# Patient Record
Sex: Male | Born: 1960
Health system: Southern US, Community
[De-identification: ages and names within clinical notes are randomized; demographics above are authoritative.]

## PROBLEM LIST (undated history)

## (undated) DIAGNOSIS — M545 Low back pain, unspecified: Secondary | ICD-10-CM

## (undated) DIAGNOSIS — K219 Gastro-esophageal reflux disease without esophagitis: Secondary | ICD-10-CM

## (undated) DIAGNOSIS — H903 Sensorineural hearing loss, bilateral: Secondary | ICD-10-CM

## (undated) DIAGNOSIS — G8929 Other chronic pain: Secondary | ICD-10-CM

## (undated) DIAGNOSIS — Z87898 Personal history of other specified conditions: Secondary | ICD-10-CM

## (undated) DIAGNOSIS — R51 Headache: Secondary | ICD-10-CM

## (undated) DIAGNOSIS — S46212A Strain of muscle, fascia and tendon of other parts of biceps, left arm, initial encounter: Secondary | ICD-10-CM

## (undated) DIAGNOSIS — R519 Headache, unspecified: Secondary | ICD-10-CM

## (undated) DIAGNOSIS — E785 Hyperlipidemia, unspecified: Secondary | ICD-10-CM

## (undated) DIAGNOSIS — J449 Chronic obstructive pulmonary disease, unspecified: Secondary | ICD-10-CM

## (undated) DIAGNOSIS — J329 Chronic sinusitis, unspecified: Secondary | ICD-10-CM

## (undated) HISTORY — DX: Low back pain: M54.5

## (undated) HISTORY — DX: Headache: R51

## (undated) HISTORY — DX: Low back pain, unspecified: M54.50

## (undated) HISTORY — DX: Other chronic pain: G89.29

## (undated) HISTORY — DX: Hyperlipidemia, unspecified: E78.5

## (undated) HISTORY — DX: Chronic obstructive pulmonary disease, unspecified: J44.9

## (undated) HISTORY — DX: Headache, unspecified: R51.9

## (undated) HISTORY — DX: Sensorineural hearing loss, bilateral: H90.3

## (undated) HISTORY — DX: Chronic sinusitis, unspecified: J32.9

## (undated) HISTORY — DX: Personal history of other specified conditions: Z87.898

## (undated) HISTORY — PX: CARPAL TUNNEL RELEASE: SHX101

## (undated) HISTORY — DX: Strain of muscle, fascia and tendon of other parts of biceps, left arm, initial encounter: S46.212A

## (undated) HISTORY — DX: Gastro-esophageal reflux disease without esophagitis: K21.9

---

## 2006-08-29 ENCOUNTER — Emergency Department (HOSPITAL_COMMUNITY): Admission: EM | Admit: 2006-08-29 | Discharge: 2006-08-29 | Payer: Self-pay | Admitting: Emergency Medicine

## 2006-08-30 ENCOUNTER — Ambulatory Visit: Payer: Self-pay | Admitting: Internal Medicine

## 2006-08-31 ENCOUNTER — Ambulatory Visit: Payer: Self-pay | Admitting: Cardiovascular Disease

## 2006-10-26 ENCOUNTER — Ambulatory Visit: Payer: Self-pay | Admitting: Cardiovascular Disease

## 2007-10-19 ENCOUNTER — Encounter: Payer: Self-pay | Admitting: *Deleted

## 2007-10-19 DIAGNOSIS — R0789 Other chest pain: Secondary | ICD-10-CM | POA: Insufficient documentation

## 2007-10-19 DIAGNOSIS — Z8679 Personal history of other diseases of the circulatory system: Secondary | ICD-10-CM

## 2008-12-11 DIAGNOSIS — Z87898 Personal history of other specified conditions: Secondary | ICD-10-CM

## 2008-12-11 HISTORY — PX: OTHER SURGICAL HISTORY: SHX169

## 2008-12-11 HISTORY — DX: Personal history of other specified conditions: Z87.898

## 2009-12-11 HISTORY — PX: OTHER SURGICAL HISTORY: SHX169

## 2010-08-04 ENCOUNTER — Encounter (INDEPENDENT_AMBULATORY_CARE_PROVIDER_SITE_OTHER): Payer: Self-pay | Admitting: *Deleted

## 2010-08-04 ENCOUNTER — Ambulatory Visit: Payer: Self-pay | Admitting: Internal Medicine

## 2010-08-04 ENCOUNTER — Encounter: Payer: Self-pay | Admitting: Internal Medicine

## 2010-08-04 DIAGNOSIS — Z87891 Personal history of nicotine dependence: Secondary | ICD-10-CM | POA: Insufficient documentation

## 2010-08-04 DIAGNOSIS — E663 Overweight: Secondary | ICD-10-CM | POA: Insufficient documentation

## 2010-08-04 DIAGNOSIS — K219 Gastro-esophageal reflux disease without esophagitis: Secondary | ICD-10-CM | POA: Insufficient documentation

## 2010-08-04 LAB — CONVERTED CEMR LAB
ALT: 28 units/L (ref 0–53)
AST: 24 units/L (ref 0–37)
Albumin: 4.1 g/dL (ref 3.5–5.2)
Alkaline Phosphatase: 76 units/L (ref 39–117)
BUN: 20 mg/dL (ref 6–23)
Bilirubin, Direct: 0.1 mg/dL (ref 0.0–0.3)
CO2: 27 meq/L (ref 19–32)
Calcium: 9.6 mg/dL (ref 8.4–10.5)
Chloride: 108 meq/L (ref 96–112)
Cholesterol: 168 mg/dL (ref 0–200)
Creatinine, Ser: 0.9 mg/dL (ref 0.4–1.5)
GFR calc non Af Amer: 94.87 mL/min (ref >60)
Glucose, Bld: 96 mg/dL (ref 70–99)
HDL: 36.7 mg/dL — ABNORMAL LOW (ref >39.00)
LDL Cholesterol: 114 mg/dL — ABNORMAL HIGH (ref 0–99)
PSA: 1.02 ng/mL (ref 0.10–4.00)
Potassium: 4.5 meq/L (ref 3.5–5.1)
Sodium: 139 meq/L (ref 135–145)
TSH: 1.87 microintl units/mL (ref 0.35–5.50)
Total Bilirubin: 0.5 mg/dL (ref 0.3–1.2)
Total CHOL/HDL Ratio: 5
Total Protein: 6.5 g/dL (ref 6.0–8.3)
Triglycerides: 89 mg/dL (ref 0.0–149.0)
VLDL: 17.8 mg/dL (ref 0.0–40.0)

## 2010-08-08 ENCOUNTER — Telehealth: Payer: Self-pay | Admitting: Family Medicine

## 2010-08-09 ENCOUNTER — Telehealth: Payer: Self-pay | Admitting: Family Medicine

## 2010-08-25 ENCOUNTER — Ambulatory Visit: Payer: Self-pay | Admitting: Internal Medicine

## 2010-08-25 DIAGNOSIS — R0602 Shortness of breath: Secondary | ICD-10-CM | POA: Insufficient documentation

## 2010-08-25 DIAGNOSIS — M545 Low back pain, unspecified: Secondary | ICD-10-CM | POA: Insufficient documentation

## 2010-08-25 DIAGNOSIS — J984 Other disorders of lung: Secondary | ICD-10-CM | POA: Insufficient documentation

## 2010-08-25 LAB — CONVERTED CEMR LAB
Cholesterol, target level: 200 mg/dL
HDL goal, serum: 40 mg/dL
LDL Goal: 100 mg/dL

## 2010-08-26 ENCOUNTER — Encounter: Payer: Self-pay | Admitting: Family Medicine

## 2010-08-29 ENCOUNTER — Telehealth (INDEPENDENT_AMBULATORY_CARE_PROVIDER_SITE_OTHER): Payer: Self-pay | Admitting: *Deleted

## 2010-09-10 HISTORY — PX: COLONOSCOPY: SHX174

## 2010-09-20 ENCOUNTER — Encounter (INDEPENDENT_AMBULATORY_CARE_PROVIDER_SITE_OTHER): Payer: Self-pay | Admitting: *Deleted

## 2010-09-22 ENCOUNTER — Ambulatory Visit: Payer: Self-pay | Admitting: Internal Medicine

## 2010-09-27 ENCOUNTER — Telehealth (INDEPENDENT_AMBULATORY_CARE_PROVIDER_SITE_OTHER): Payer: Self-pay | Admitting: *Deleted

## 2010-09-28 ENCOUNTER — Encounter (INDEPENDENT_AMBULATORY_CARE_PROVIDER_SITE_OTHER): Payer: Self-pay | Admitting: *Deleted

## 2010-10-06 ENCOUNTER — Ambulatory Visit: Payer: Self-pay | Admitting: Internal Medicine

## 2010-10-06 LAB — HM COLONOSCOPY: HM Colonoscopy: 2

## 2010-10-12 ENCOUNTER — Encounter: Payer: Self-pay | Admitting: Internal Medicine

## 2011-01-10 NOTE — Letter (Signed)
Summary: Patient Notice-Hyperplastic Polyps  [facility]  [address] [place], [place] [address]   Phone: [phone]  Fax: [phone]        October 12, 2010 MRN: [id number]    Derrick Horn [address] [place], [place]  [address]    Dear Derrick Horn,  I am pleased to inform you that the colon polyps removed during your recent colonoscopy were NOT pre-cancerous.  It is therefore my recommendation that you have a repeat colonoscopy examination in 10 years for routine colorectal cancer screening.  Should you develop new or worsening symptoms of abdominal pain, bowel habit changes or bleeding from the rectum or bowels, please schedule an evaluation with either your primary care physician or with me.  Please call us if you are having persistent problems or have questions about your condition that have not been fully answered at this time.   Sincerely,  Iva Boop MD, [facility] This letter has been electronically signed by your physician.  Appended Document: Patient Notice-Hyperplastic Polyps letter mailed

## 2011-01-10 NOTE — Progress Notes (Signed)
Summary: Chest X-ray  Phone Note Other Incoming Call back at [phone]   Caller: Danella Penton @ [facility] Summary of Call: [facility] asking for pt's chest x-ray for their review. Initial call taken by: Mervin Hack CMA Duncan Dull),  August 29, 2010 12:10 PM  Follow-up for Phone Call        Faxed results as requested Follow-up by: Janee Morn CMA Duncan Dull),  August 29, 2010 1:08 PM

## 2011-01-10 NOTE — Procedures (Signed)
Summary: Colonoscopy  Patient: Derrick Horn Note: All result statuses are Final unless otherwise noted.  Tests: (1) Colonoscopy (COL)   COL Colonoscopy           DONE     [facility]     [address]     [place], [place]  [address]           COLONOSCOPY PROCEDURE REPORT           PATIENT:  Derrick Horn, Derrick Horn  MR#:  [id number]     BIRTHDATE:  [birth year], 49 yrs. old  GENDER:  male     ENDOSCOPIST:  Iva Boop, MD, [facility]     REF. BY:  Eustaquio Boyden, M.D.     PROCEDURE DATE:  10/06/2010     PROCEDURE:  Colonoscopy with snare polypectomy     ASA CLASS:  Class I     INDICATIONS:  Routine Risk Screening     MEDICATIONS:   Fentanyl 75 mcg IV, Versed 7 mg IV           DESCRIPTION OF PROCEDURE:   After the risks benefits and     alternatives of the procedure were thoroughly explained, informed     consent was obtained.  Digital rectal exam was performed and     revealed no abnormalities and normal prostate.   The LB CF-[id number]     [id number] endoscope was introduced through the anus and advanced to     the cecum, which was identified by both the appendix and ileocecal     valve, without limitations.  The quality of the prep was good,     using Colyte.  The instrument was then slowly withdrawn as the     colon was fully examined. Insertion: 4:40 minutes Withdrawal;     12:46 minutes     <<PROCEDUREIMAGES>>           FINDINGS:  Two polyps were found. They were diminutive. They were     3-4 mm in size. Splenic flexure and sigmoid. Polyps were snared     without cautery. Retrieval was successful.   This was otherwise a     normal examination of the colon.   Retroflexed views in the rectum     revealed no abnormalities.    The scope was then withdrawn from     the patient and the procedure completed.           COMPLICATIONS:  None     ENDOSCOPIC IMPRESSION:     1) Two diminutive  polyps removed     2) Otherwise normal examination, good prep           REPEAT EXAM:  In for  Colonoscopy, pending biopsy results.           Iva Boop, MD, [facility]           CC:  Eustaquio Boyden MD     The Patient           n.     Rosalie Doctor:   Iva Boop at 10/06/2010 09:08 AM           Derrick Horn, [id number]  Note: An exclamation mark (!) indicates a result that was not dispersed into the flowsheet. Document Creation Date: 10/06/2010 9:09 AM _______________________________________________________________________  (1) Order result status: Final Collection or observation date-time: 10/06/2010 08:55 Requested date-time:  Receipt date-time:  Reported date-time:  Referring Physician:   Ordering Physician: Stan Head [phone]) Specimen Source:  Source: Launa Grill Order Number: [id number] Lab site:   Appended Document: Colonoscopy   Colonoscopy  Procedure date:  10/06/2010  Findings:      1) Two diminutive  polyps removed - hyperpastic and inflammatory     2) Otherwise normal examination, good prep  Comments:      Repeat colonoscopy in 10 years.    Procedures Next Due Date:    Colonoscopy: 10/2020   Appended Document: Colonoscopy     Procedures Next Due Date:    Colonoscopy: 10/2020

## 2011-01-10 NOTE — Miscellaneous (Signed)
Summary: [facility] Previsit/prep  Clinical Lists Changes  Medications: Added new medication of MOVIPREP 100 GM  SOLR (PEG-KCL-NACL-NASULF-NA ASC-C) As per prep instructions. - Signed Rx of MOVIPREP 100 GM  SOLR (PEG-KCL-NACL-NASULF-NA ASC-C) As per prep instructions.;  #1 x 0;  Signed;  Entered by: Wyona Almas RN;  Authorized by: Iva Boop MD, [facility];  Method used: [facility] [phone]*, [address], [place], [place], [place]  [address], Ph: [phone] or [phone], Fax: [phone] Allergies: Changed allergy or adverse reaction from * AZITHROMYCIN to * AZITHROMYCIN    Prescriptions: MOVIPREP 100 GM  SOLR (PEG-KCL-NACL-NASULF-NA ASC-C) As per prep instructions.  #1 x 0   Entered by:   Wyona Almas RN   Authorized by:   Iva Boop MD, [facility]   Signed by:   Wyona Almas RN on 09/22/2010   Method used:   [facility] to        [facility] [phone]* (retail)       [address] [place], [place]  [address]       Ph: [phone] or [phone]       Fax: [phone]   RxID:   [id number]

## 2011-01-10 NOTE — Assessment & Plan Note (Signed)
Summary: 2-3 weeks follow up smoking adn breathing/rbh   Vital Signs:  Patient profile:   50 year old male Weight:      186.75 pounds Temp:     97.8 degrees F oral Pulse rate:   64 / minute Pulse rhythm:   regular BP sitting:   110 / 80  (left arm) Cuff size:   regular  Vitals Entered By: Selena Batten Dance CMA Duncan Dull) (August 25, 2010 8:11 AM) CC: Follow up, Lipid Management   History of Present Illness: CC: f/u smoking, breathing  Smoking - QUIT x 2 wks.  hasn't used chantix, too expensive.  Became pissed off at [employer] and has decided to quit without using prescription medicine.  Using 1/2 patch daily (has self titrated from 1 whole patch).  irritated throat getting better.  Still having trouble with breathing:  Breathing - feels SOB esp when laying down at night.  Wakes up a couple times during night, feels phlegm and tightness in chest.  ETT 2010 ([facility]) WNL per patient.  no cough.  Celebrex pill for back - doing well, taking every other day, bothers stomache.  Reviewed labwork from last visit.  LDL 115, would like to see lower given early fmhx of CAD.  Requests flu shot today.  Lipid Management History:      Positive NCEP/ATP III risk factors include male age 36 years old or older, HDL cholesterol less than 40, and family history for ischemic heart disease (males less than 79 years old).  Negative NCEP/ATP III risk factors include non-diabetic, non-tobacco-user status, non-hypertensive, no prior stroke/TIA, and no peripheral vascular disease.    Preventive Screening-Counseling & Management  Alcohol-Tobacco     Smoking Status: quit  Current Medications (verified): 1)  Famotidine 20 Mg Tabs (Famotidine) .... As Needed Heartburn 2)  Celebrex 200 Mg Caps (Celecoxib) .... One By Mouth Every Other Day As Needed Back Pain  Allergies: 1)  ! * Azithromycin  Past History:  Past Medical History: Last updated: 08/23/2010 TOBACCO ABUSE, HX OF (ICD-V15.82) CHEST  PAIN, HX OF (ICD-V12.50), stress test WNL mild GERD chronic lower back pain recurrent sinus infections  Past Surgical History: Last updated: 08/04/2010 R CARPAL TUNNEL RELEASE, HX OF (ICD-V45.89)    ETT WNL 2010 (Dr. Eden Emms)  Family History: Last updated: 08/04/2010 GFA - CAD/MI at mid 30s, CVA Father - CAD s/p stents GMA - uterine? 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 Lungs are clear to auscultation, no crackles or wheezes. slightly coarse Heart:  Normal rate and regular rhythm. S1 and S2 normal without gallop, murmur, click, rub or other extra sounds. Extremities:  No clubbing, cyanosis, edema, or deformity noted with normal full range of motion of all joints.     Impression & Recommendations:  Problem # 1:  DYSPNEA (ICD-786.05) in longtime smoker.  check CXR baseline.  likely will note improvement as goes more time without smoking.  ? component of COPD.  if not improved, consider starting combivent vs advair. Orders: T-2 View CXR (71020TC)  Problem # 2:  SMOKER (ICD-305.1) doing remarkably well only using patch.  rec continued abstinence.  pt has Quitline number and website if needs. The following medications were removed from the medication list:    Buproban 150 Mg Xr12h-tab (Bupropion hcl (smoking deter)) ..... One pill daily for 3 days then increase to one pill two times a day  Orders: T-2 View CXR (71020TC)  Problem # 3:  HYPERLIPIDEMIA LDL 112, HDL 36 (ICD-272.4) discussed elevated LDL, low HDL.  Pt states eating more healthy now, considering  starting P90x.  I would like goal to be <100 given early fmhx CAD.  Labs Reviewed: SGOT: 24 (08/04/2010)   SGPT: 28 (08/04/2010)   HDL:36.70 (08/04/2010)  LDL:114 (08/04/2010)  Chol:168 (08/04/2010)  Trig:89.0 (08/04/2010)  Problem # 4:  LOW BACK PAIN, CHRONIC (ICD-724.2) per patient, h/o mild scoliosis and DDD.  on celebrex.  His updated medication list for this problem includes:    Celebrex 200 Mg Caps (Celecoxib) ..... One by mouth every other day as needed back pain  Complete Medication List: 1)  Famotidine 20 Mg Tabs (Famotidine) .... As needed heartburn 2)  Celebrex 200 Mg Caps (Celecoxib) .... One by mouth every other day as needed back pain  Other Orders: Flu Vaccine 42yrs + (04540) Admin 1st Vaccine (98119)  Lipid Assessment/Plan:      Based on NCEP/ATP III, the patient's risk factor category is "2 or more risk factors and a calculated 10 year CAD risk of < 20%".  The patient's lipid goals are as follows: Total cholesterol goal is 200; LDL cholesterol goal is 100; HDL cholesterol goal is 40; Triglyceride goal is 150.  His LDL cholesterol goal has not been met.  Secondary causes for hyperlipidemia have been ruled out.  He has been counseled on adjunctive measures for lowering his cholesterol and has been provided with dietary instructions.     Patient Instructions: 1)  Chest xray today.  Flu shot today. 2)  Please return in 2 months for follow up on smoking and cholesterol levels. 3)  Start taking a baby aspirin 4)  Great job with the smoking cessation!!  Keep up the good work. 5)  Pleasure to see you today.  Call clinic with questions.  Current Allergies (reviewed today): ! * AZITHROMYCIN   Immunizations Administered:  Influenza Vaccine # 1:    Vaccine Type: Fluvax 3+    Site: left deltoid    Mfr: GlaxoSmithKline    Dose: 0.5 ml    Route: IM    Given by: Janee Morn CMA (AAMA)    Exp. Date: 06/10/2011    Lot #: [id number]    VIS given: 07/05/10 version given  August 25, 2010.  Flu Vaccine Consent Questions:    Do you have a history of severe allergic reactions to this vaccine? no    Any prior history of allergic reactions to egg and/or gelatin? no    Do you have a sensitivity to the preservative Thimersol? no    Do you have a past history of Guillan-Barre Syndrome? no    Do you currently have an acute febrile illness? no    Have you ever had a severe reaction to latex? no    Vaccine information given and explained to patient? yes    Prevention & Chronic Care Immunizations   Influenza vaccine: Fluvax 3+  (08/25/2010)   Influenza vaccine due: 08/12/2011    Tetanus booster: 08/04/2010: Tdap   Tetanus booster due: 08/04/2020    Pneumococcal vaccine: Not documented  Colorectal Screening   Hemoccult: Not documented    Colonoscopy: Not documented  Other  Screening   PSA: 1.02  (08/04/2010)   Smoking status: quit  (08/25/2010)  Lipids   Total Cholesterol: 168  (08/04/2010)   LDL: 114  (08/04/2010)   LDL Direct: Not documented   HDL: 36.70  (08/04/2010)   Triglycerides: 89.0  (08/04/2010)    SGOT (AST): 24  (08/04/2010)   SGPT (ALT): 28  (08/04/2010)   Alkaline phosphatase: 76  (08/04/2010)   Total bilirubin: 0.5  (08/04/2010)  Self-Management Support :    Lipid self-management support: Not documented

## 2011-01-10 NOTE — Assessment & Plan Note (Signed)
Summary: NEW PT TO ESTABH/DLO   Vital Signs:  Patient profile:   50 year old male Height:      68.5 inches Weight:      185 pounds BMI:     27.82 Temp:     98.0 degrees F oral Pulse rate:   70 / minute Pulse rhythm:   regular BP sitting:   116 / 78  (left arm) Cuff size:   large  Vitals Entered By: Selena Batten Dance CMA Duncan Dull) (August 04, 2010 10:05 AM) CC: New patient to establish care   History of Present Illness: CC: new patient, issues  1. smoking - since age 69yo, 20/4 to 1/2 ppd.  Multiple quit attempts.  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  Wants help, ready to quit.  Has tried patches (1/2 patch) and cold [place] in past.  [place].  Interested in chantix.  2. SOB - noticed during day, wants lung checked.  wants to quit smoking.  No leg swelling.  uses 1 pillow at night, no PND.  Doesn't snore excessively.  No fevers/chills, weight changes, night sweats.    3. h/o lower back pain x 5 years - previously saw MD at [facility] in [facility]?  told OA and mild scoliosis.  increases with activity.  Doesn't like meds.  Inversion table helps.  only in lower back.  s/p PT x 6 months, didn't notice much improvement.  glucosamine/chondroitin initially helped, no longer.  asks about cymbalta.  4. GERD - takes generic pepcid every 1-2 days.  knows what to stay away from.    5. preventative - wants colonscopy.  tetanus due, declines flu today, due for prosate check  Preventive Screening-Counseling & Management  Alcohol-Tobacco     Alcohol drinks/day: <1     Smoking Status: current  Caffeine-Diet-Exercise     Caffeine use/day: 1 cup coffee      Blood Transfusions:  no.    Current Medications (verified): 1)  Famotidine 20 Mg Tabs (Famotidine) .... As Needed Heartburn  Allergies: 1)  ! * Azithromycin  Past History:  Past Medical History: TOBACCO ABUSE, HX OF (ICD-V15.82) CHEST PAIN, HX OF (ICD-V12.50), stress test WNL mild GERD chronic lower back pain  Past Surgical  History: R CARPAL TUNNEL RELEASE, HX OF (ICD-V45.89)    ETT WNL 2010 (Dr. Eden Emms)  Family History: GFA - CAD/MI at mid 30s, CVA Father - CAD s/p stents GMA - uterine? 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         SOB at baseline.  blood when wiping.  no depression/anxiety, mood issues.  + weak stream, increased nocturia.  Physical Exam  General:  Well-developed,well-nourished,in no acute distress; alert,appropriate and cooperative throughout examination Head:  Normocephalic and atraumatic without obvious abnormalities. No apparent alopecia or balding. Eyes:  No corneal or conjunctival inflammation noted. EOMI. Perrla.  Ears:  External ear exam shows no significant lesions or deformities.  Otoscopic examination reveals clear canals, tympanic membranes are intact bilaterally without bulging, retraction, inflammation or discharge. Hearing is grossly normal bilaterally. Nose:  External nasal examination shows no deformity or inflammation. Nasal mucosa are pink and moist without lesions or exudates. Mouth:  Oral mucosa and oropharynx without lesions or exudates.  Teeth in good repair. Neck:  No deformities, masses, or tenderness noted. Lungs:  Normal respiratory effort, chest expands symmetrically. Lungs are clear to auscultation, no crackles or wheezes. slightly coarse Heart:  Normal rate and regular rhythm. S1 and S2 normal without gallop, murmur, click, rub or other  extra sounds. Abdomen:  Bowel sounds positive,abdomen soft and non-tender without masses, organomegaly or hernias noted. Rectal:  No external abnormalities noted. Normal sphincter tone. No rectal masses or tenderness. Prostate:  Prostate gland firm and smooth, + enlargement, no nodularity, tenderness, mass, asymmetry or induration.  about 30-40gms Msk:  No deformity or scoliosis noted of thoracic or lumbar spine.   Pulses:  2+ rad pulses Extremities:  No clubbing, cyanosis, edema, or deformity noted with normal full range of motion of all joints.   Neurologic:  CN 2-12 grossly intact, station and gait intact Skin:  Intact without suspicious lesions or rashes   Impression & Recommendations:  Problem # 1:  HEALTH MAINTENANCE EXAM (ICD-V70.0)  Reviewed preventive care protocols, scheduled due services, and updated immunizations.  declines flu.  tdap today.  Problem # 2:  SPECIAL SCREENING FOR MALIGNANT NEOPLASMS COLON (ICD-V76.51) referral for colonoscopy.  Orders: Gastroenterology Referral (GI)  Problem # 3:  SPECIAL SCREENING MALIGNANT NEOPLASM OF PROSTATE (ICD-V76.44) DRE with enlarged prostate.  await PSA and f/u in 3 wks for sxs, consider starting flomax. Orders: TLB-PSA (Prostate Specific Antigen) (84153-PSA)  Problem # 4:  OVERWEIGHT (ICD-278.02) basic bloodwork. Orders: TLB-BMP (Basic Metabolic Panel-BMET) (80048-METABOL) TLB-Hepatic/Liver Function Pnl (80076-HEPATIC) TLB-Lipid Panel (80061-LIPID)  Ht: 68.5 (08/04/2010)   Wt: 185 (08/04/2010)   BMI: 27.82 (08/04/2010)  Problem # 5:  GERD (ICD-530.81) continue H2R blocker prn His updated medication list for this problem includes:    Famotidine 20 Mg Tabs (Famotidine) .Marland Kitchen... As needed heartburn  Problem # 6:  SMOKER (ICD-305.1) Encouraged smoking cessation and discussed different methods for smoking cessation.  Pt opts to try chantix.  sent in, return 2-3 wks for f/u and f/u breathing, consider CXR.  no signs of  malignancy currently.  His updated medication list for this problem includes:    Chantix Starting Month Pak 0.5 Mg X 11 & 1 Mg X 42 Tabs (Varenicline tartrate) ..... One daily as directed    Chantix Continuing Month Pak 1 Mg Tabs (Varenicline tartrate) ..... One daily, use as directed  Complete Medication List: 1)  Famotidine 20 Mg Tabs (Famotidine) .... As needed heartburn 2)  Chantix Starting Month Pak 0.5 Mg X 11 & 1 Mg X 42 Tabs (Varenicline tartrate) .... One daily as directed 3)  Chantix Continuing Month Pak 1 Mg Tabs (Varenicline tartrate) .... One daily, use as directed  Other Orders: Tdap => 27yrs IM (03474) Admin 1st Vaccine (25956)  Patient Instructions: 1)  Return in 2-3 weeks for follow up on smoking and breathing. 2)  Chantix. 3)  [email or web address] qu[place].   4)  Tetanus shot today. 5)  Colonoscopy referral.  pass by [facility] office today. 6)  blood work today.  PSA. 7)  Records from [facility]. Prescriptions: CHANTIX CONTINUING MONTH PAK 1 MG TABS (VARENICLINE TARTRATE) one daily, use as directed  #1 x 0   Entered and Authorized by:   Eustaquio Boyden  MD   Signed by:   Eustaquio Boyden  MD on 08/04/2010  Method used:   [facility] [phone]* (retail)       [address] [place], [place]  [address]       Ph: [phone] or [phone]       Fax: [phone]   RxID:   [id number] CHANTIX STARTING MONTH PAK 0.5 MG X 11 & 1 MG X 42 TABS (VARENICLINE TARTRATE) one daily as directed  #1 x 0   Entered and Authorized by:   Eustaquio Boyden  MD   Signed by:   Eustaquio Boyden  MD on 08/04/2010   Method used:   [facility]        [facility] [phone]* (retail)       [address] [place], [place]  [address]       Ph: [phone] or [phone]       Fax: [phone]   RxID:   [id number]   Prior Medications: Current Allergies (reviewed today): ! *  AZITHROMYCIN   Immunizations Administered:  Tetanus Vaccine:    Vaccine Type: Tdap    Site: left deltoid    Mfr: GlaxoSmithKline    Dose: 0.5 ml    Route: IM    Given by: Selena Batten Dance CMA (AAMA)    Exp. Date: 09/29/2012    Lot #: [id number]    VIS given: 10/29/07 version given August 04, 2010.  Appended Document: Records from [facility]    Clinical Lists Changes  Observations: Added new observation of PAST MED HX: TOBACCO ABUSE, HX OF (ICD-V15.82) CHEST PAIN, HX OF (ICD-V12.50), stress test WNL mild GERD chronic lower back pain recurrent sinus infections (08/23/2010 14:24)        Past History:  Past Medical History: TOBACCO ABUSE, HX OF (ICD-V15.82) CHEST PAIN, HX OF (ICD-V12.50), stress test WNL mild GERD chronic lower back pain recurrent sinus infections

## 2011-01-10 NOTE — Miscellaneous (Signed)
Summary: new prep  Clinical Lists Changes  Medications: Added new medication of DULCOLAX 5 MG  TBEC (BISACODYL) Day before procedure take 2 at 3pm and 2 at 8pm. - Signed Added new medication of METOCLOPRAMIDE HCL 10 MG  TABS (METOCLOPRAMIDE HCL) As per prep instructions. - Signed Added new medication of MIRALAX   POWD (POLYETHYLENE GLYCOL 3350) As per prep  instructions. - Signed Rx of DULCOLAX 5 MG  TBEC (BISACODYL) Day before procedure take 2 at 3pm and 2 at 8pm.;  #4 x 0;  Signed;  Entered by: Wyona Almas RN;  Authorized by: Iva Boop MD, [facility];  Method used: [facility] to [facility] [phone]*, [address], [place], [place], [place]  [address], Ph: [phone] or [phone], Fax: [phone] Rx of METOCLOPRAMIDE HCL 10 MG  TABS (METOCLOPRAMIDE HCL) As per prep instructions.;  #2 x 0;  Signed;  Entered by: Wyona Almas RN;  Authorized by: Iva Boop MD, [facility];  Method used: Electronically to [facility]. [phone]*, [address] [place], [place], [place]  [address], Ph: [phone] or [phone], Fax: [phone] Rx of MIRALAX   POWD (POLYETHYLENE GLYCOL 3350) As per prep  instructions.;  #255gm x 0;  Signed;  Entered by: Wyona Almas RN;  Authorized by: Iva Boop MD, [facility];  Method used: Electronically to [facility]. [phone]*, [address] [place], [place], [place]  [address], Ph: [phone] or [phone], Fax: [phone]    Prescriptions: MIRALAX   POWD (POLYETHYLENE GLYCOL 3350) As per prep  instructions.  #255gm x 0   Entered by:   Wyona Almas RN   Authorized by:   Iva Boop MD, [facility]   Signed by:   Wyona Almas RN on 09/28/2010   Method used:   Electronically to        [facility]. [phone]* (retail)       [address] [place], [place]  [address]       Ph: [phone] or [phone]       Fax: [phone]   RxID:   [id number] METOCLOPRAMIDE HCL 10 MG  TABS (METOCLOPRAMIDE HCL) As per prep  instructions.  #2 x 0   Entered by:   Wyona Almas RN   Authorized by:   Iva Boop MD, [facility]   Signed by:   Wyona Almas RN on 09/28/2010   Method used:   Electronically to        [facility]. [phone]* (retail)       [address] [place], [place]  [address]       Ph: [phone] or [phone]       Fax: [phone]   RxID:   [id number] DULCOLAX 5 MG  TBEC (BISACODYL) Day before procedure take 2 at 3pm and 2 at 8pm.  #4 x 0   Entered by:   Wyona Almas RN   Authorized by:   Iva Boop MD, [facility]   Signed by:   Wyona Almas RN on 09/28/2010   Method used:   Electronically to        [facility]. [phone]* (retail)       [address]  [place], [place]  [address]       Ph: [phone] or [phone]       Fax: [phone]   RxID:   [id number]

## 2011-01-10 NOTE — Letter (Signed)
Summary: Lab result letter  [place] [facility]  [address] [place], [place] [address]   Phone: [phone]  Fax: [phone]    08/04/2010  VLAD MAYBERRY [address] [place], [place]  [address]  Dear Mr. HESLOP,   Overall your labs look good. Your LDL which is your "bad" cholesterol is 114 and your HDL which is your "good" cholesterol is 36. I recommend that given your family history of Coronary Artery Disease at an early age that your LDL be less than 100. I recommend lifestyle changes for now which it be a low fat diet and increased exercise. We can discuss this more at your next visit.   I have included a copy of the labs for your record and review.   Please do not hesitate to call the office if you have any questions or concerns.        Sincerely,      Dr. Eustaquio Boyden

## 2011-01-10 NOTE — Letter (Signed)
Summary: [facility] Instructions  [facility]  [address] [place], [place] [address]   Phone: [phone]  Fax: [phone]       Derrick Horn    10/10/1961    MRN: [id number]       Procedure Day Dorna Bloom:  Lenor Coffin  10/06/10     Arrival Time: 7:30AM     Procedure Time:  8:30AM     Location of Procedure:                    _ X_  [facility] (4th Floor)  PREPARATION FOR COLONOSCOPY WITH MIRALAX  Starting 5 days prior to your procedure 10/01/10 do not eat nuts, seeds, popcorn, corn, beans, peas,  salads, or any raw vegetables.  Do not take any fiber supplements (e.g. Metamucil, Citrucel, and Benefiber). ____________________________________________________________________________________________________   THE DAY BEFORE YOUR PROCEDURE         DATE: 10/05/10  DAY: WEDNESDAY  1   Drink clear liquids the entire day-NO SOLID FOOD  2   Do not drink anything colored red or purple.  Avoid juices with pulp.  No orange juice.  3   Drink at least 64 oz. (8 glasses) of fluid/clear liquids during the day to prevent dehydration and help the prep work efficiently.  CLEAR LIQUIDS INCLUDE: Water Jello Ice Popsicles Tea (sugar ok, no milk/cream) Powdered fruit flavored drinks Coffee (sugar ok, no milk/cream) Gatorade Juice: apple, white grape, white cranberry  Lemonade Clear bullion, consomm, broth Carbonated beverages (any kind) Strained chicken noodle soup Hard Candy  4   Mix the entire bottle of Miralax with 64 oz. of Gatorade/Powerade in the morning and put in the refrigerator to chill.  5   At 3:00 pm take 2 Dulcolax/Bisacodyl tablets.  6   At 4:30 pm take one Reglan/Metoclopramide tablet.  7  Starting at 5:00 pm drink one 8 oz glass of the Miralax mixture every 15-20 minutes until you have finished drinking the entire 64 oz.  You should finish drinking prep around 7:30 or 8:00 pm.  8   If you are nauseated, you may take the 2nd Reglan/Metoclopramide  tablet at 6:30 pm.        9    At 8:00 pm take 2 more DULCOLAX/Bisacodyl tablets.     THE DAY OF YOUR PROCEDURE      DATE:  10/06/10  DAY: Lenor Coffin  You may drink clear liquids until 6:30AM (2 HOURS BEFORE PROCEDURE).   MEDICATION INSTRUCTIONS  Unless otherwise instructed, you should take regular prescription medications with a small sip of water as early as possible the morning of your procedure.       OTHER INSTRUCTIONS  You will need a responsible adult at least 51 years of age to accompany you and drive you home.   This person must remain in the waiting room during your procedure.  Wear loose fitting clothing that is easily removed.  Leave jewelry and other valuables at home.  However, you may wish to bring a book to read or an iPod/MP3 player to listen to music as you wait for your procedure to start.  Remove all body piercing jewelry and leave at home.  Total time from sign-in until discharge is approximately 2-3 hours.  You should go home directly after your procedure and rest.  You can resume normal activities the day after your procedure.  The day of your procedure you should not:   Drive   Make legal decisions  Operate machinery   Drink alcohol   Return to work  You will receive specific instructions about eating, activities and medications before you leave.   The above instructions have been reviewed and explained to me by   Wyona Almas RN  September 28, 2010 8:04 AM     I fully understand and can verbalize these instructions _____________________________ Date _______

## 2011-01-10 NOTE — Letter (Signed)
Summary: [facility] Instructions  [facility]  [address] [place], [place] [address]   Phone: [phone]  Fax: [phone]       Derrick Horn    Aug 30, 1954    MRN: [id number]        Procedure Day Dorna Bloom:  Lenor Coffin 10/06/10     Arrival Time:  7:30am     Procedure Time: 8:30am     Location of Procedure:                    _X _   [facility] ([facility]                       PREPARATION FOR COLONOSCOPY WITH MOVIPREP   Starting 5 days prior to your procedure  SATURDAY 10/01/10  do not eat nuts, seeds, popcorn, corn, beans, peas,  salads, or any raw vegetables.  Do not take any fiber supplements (e.g. Metamucil, Citrucel, and Benefiber).  THE DAY BEFORE YOUR PROCEDURE         DATE:  [facility]  10/05/10  1.  Drink clear liquids the entire day-NO SOLID FOOD  2.  Do not drink anything colored red or purple.  Avoid juices with pulp.  No orange juice.  3.  Drink at least 64 oz. (8 glasses) of fluid/clear liquids during the day to prevent dehydration and help the prep work efficiently.  CLEAR LIQUIDS INCLUDE: Water Jello Ice Popsicles Tea (sugar ok, no milk/cream) Powdered fruit flavored drinks Coffee (sugar ok, no milk/cream) Gatorade Juice: apple, white grape, white cranberry  Lemonade Clear bullion, consomm, broth Carbonated beverages (any kind) Strained chicken noodle soup Hard Candy                             4.  In the morning, mix first dose of MoviPrep solution:    Empty 1 Pouch A and 1 Pouch B into the disposable container    Add lukewarm drinking water to the top line of the container. Mix to dissolve    Refrigerate (mixed solution should be used within 24 hrs)  5.  Begin drinking the prep at 5:00 p.m. The MoviPrep container is divided by 4 marks.   Every 15 minutes drink the solution down to the next mark (approximately 8 oz) until the full liter is complete.   6.  Follow completed prep with 16 oz of clear liquid of your  choice (Nothing red or purple).  Continue to drink clear liquids until bedtime.  7.  Before going to bed, mix second dose of MoviPrep solution:    Empty 1 Pouch A and 1 Pouch B into the disposable container    Add lukewarm drinking water to the top line of the container. Mix to dissolve    Refrigerate  THE DAY OF YOUR PROCEDURE      DATE: THURSDAY 10/06/10  Beginning at  3:30 a.m. (5 hours before procedure):         1. Every 15 minutes, drink the solution down to the next mark (approx 8 oz) until the full liter is complete.  2. Follow completed prep with 16 oz. of clear liquid of your choice.    3. You may drink clear liquids until 6:30am (2 HOURS BEFORE PROCEDURE).   MEDICATION INSTRUCTIONS  Unless otherwise instructed, you should take regular prescription medications with a small sip of water   as early as possible the morning  of your procedure.       OTHER INSTRUCTIONS  You will need a responsible adult at least 51 years of age to accompany you and drive you home.   This person must remain in the waiting room during your procedure.  Wear loose fitting clothing that is easily removed.  Leave jewelry and other valuables at home.  However, you may wish to bring a book to read or  an iPod/MP3 player to listen to music as you wait for your procedure to start.  Remove all body piercing jewelry and leave at home.  Total time from sign-in until discharge is approximately 2-3 hours.  You should go home directly after your procedure and rest.  You can resume normal activities the  day after your procedure.  The day of your procedure you should not:   Drive   Make legal decisions   Operate machinery   Drink alcohol   Return to work  You will receive specific instructions about eating, activities and medications before you leave.    The above instructions have been reviewed and explained to me by   Wyona Almas RN  September 22, 2010 9:48 AM     I fully  understand and can verbalize these instructions _____________________________ Date _________

## 2011-01-10 NOTE — Letter (Signed)
Summary: Previsit letter  [facility]  [address] [place], [place] [address]   Phone: [phone]  Fax: [phone]       08/04/2010 MRN: [id number]  Derrick Horn [address] [place], [place]  [address]  Dear Derrick Horn,  Welcome to the Gastroenterology Division at [facility].    You are scheduled to see a nurse for your pre-procedure visit on 09-22-10 at 9:30am on the 3rd floor at [facility], [address]  We ask that you try to arrive at our office 15 minutes prior to your appointment time to allow for check-in.  Your nurse visit will consist of discussing your medical and surgical history, your immediate family medical history, and your medications.    Please bring a complete list of all your medications or, if you prefer, bring the medication bottles and we will list them.  We will need to be aware of both prescribed and over the counter drugs.  We will need to know exact dosage information as well.  If you are on blood thinners (Coumadin, Plavix, Aggrenox, Ticlid, etc.) please call our office today/prior to your appointment, as we need to consult with your physician about holding your medication.   Please be prepared to read and sign documents such as consent forms, a financial agreement, and acknowledgement forms.  If necessary, and with your consent, a friend or relative is welcome to sit-in on the nurse visit with you.  Please bring your insurance card so that we may make a copy of it.  If your insurance requires a referral to see a specialist, please bring your referral form from your primary care physician.  No co-pay is required for this nurse visit.     If you cannot keep your appointment, please call [phone] to cancel or reschedule prior to your appointment date.  This allows [place] the opportunity to schedule an appointment for another patient in need of care.    Thank you for choosing [facility] for your medical needs.   We appreciate the opportunity to care for you.  Please visit [place] at our website  to learn more about our practice.                     Sincerely.                                                                                                                   The Gastroenterology Division

## 2011-01-10 NOTE — Progress Notes (Signed)
Summary: wants to try celebrex  Phone Note Call from Patient   Caller: Derrick Horn  [phone], [phone] Summary of Call: Pt was prescribed chantix to help him stop smoking but that isnt covered on his insurance, so his wife is asking if there is something else that he can try.  He is already using patches, which are not helping.  Also, wife is asking if pt can be prescribed celebrex for the arthritis in his back, she says he had brought this up to you at his visit.  [facility] [address] [address] Initial call taken by: Lowella Petties CMA,  August 08, 2010 10:39 AM  Follow-up for Phone Call        we have cards here for $30 off prescription chantix if interested.  if not, may try wellbutrin (antidepressant, common side effects are nausea and HA, watch mood for first few weeks while on). Script for celebrex sent in as well, slightly higher cardiovascular risk than naprosyn but better on stomach. Follow-up by: Eustaquio Boyden  MD,  August 08, 2010 12:38 PM  Additional Follow-up for Phone Call Additional follow up Details #1::        Spoke with patient's wife-he will get coupon for Chantix. Placed up front for pick up. Aware of Celebrex being sent to pharmacy. Additional Follow-up by: Janee Morn CMA Duncan Dull),  August 08, 2010 1:11 PM    New/Updated Medications: BUPROBAN 150 MG XR12H-TAB (BUPROPION HCL (SMOKING DETER)) one pill daily for 3 days then increase to one pill two times a day CELEBREX 200 MG CAPS (CELECOXIB) one by mouth daily as needed back pain Prescriptions: CELEBREX 200 MG CAPS (CELECOXIB) one by mouth daily as needed back pain  #30 x 2   Entered and Authorized by:   Eustaquio Boyden  MD   Signed by:   Eustaquio Boyden  MD on 08/08/2010   Method used:   [facility] to        [facility] [phone]* (retail)       [address] [place], [place]  [address]       Ph: [phone] or [phone]       Fax: [phone]   RxID:   [id number] BUPROBAN 150 MG  XR12H-TAB (BUPROPION HCL (SMOKING DETER)) one pill daily for 3 days then increase to one pill two times a day  #60 x 1   Entered and Authorized by:   Eustaquio Boyden  MD   Signed by:   Eustaquio Boyden  MD on 08/08/2010   Method used:   [facility]ron[facility] to        [facility] [phone]* (retail)       [address] [place], [place]  [address]       Ph: [phone] or [phone]       Fax: [phone]   RxID:   [id number]

## 2011-01-10 NOTE — Progress Notes (Signed)
Summary: prior Berkley Harvey is needed for wellbutrin  Phone Note From Pharmacy   [id number]*/ [place] Summary of Call: Prior Berkley Harvey is needed for wellbutrin.  They will probably not approve this for smoking cessation.  Form is in your in box. Initial call taken by: Lowella Petties CMA,  August 09, 2010 8:33 AM  Follow-up for Phone Call        PA for quantity - limit is 1x/day.  If pt trying chantix, no need to fill form for wellbutrin out.  If pt decides to try wellbutrin, can switch to one daily instead of prescribed two times a day.   i did not fill out form. Follow-up by: Eustaquio Boyden  MD,  August 09, 2010 11:16 AM  Additional Follow-up for Phone Call Additional follow up Details #1::        Spoke with patient. He has been 4 days smoke free without any Chantix or Wellbutrin. He is using the patch only!! He said he got mad when he found out how much the Chantix was and it made him determined to do it without Rx's. He says he's doing good so far with the patch (no cravings, etc.) and he will use the quitline if he needs to. If things get bad/worse for him he will pay for the Chantix, but right now he is doing great with the patches. I encouraged him and praised his great work and effort!!  Additional Follow-up by: Selena Batten Dance CMA Duncan Dull),  August 12, 2010 11:14 AM    Additional Follow-up for Phone Call Additional follow up Details #2::    thanks.  great Follow-up by: Eustaquio Boyden  MD,  August 12, 2010 12:00 PM

## 2011-01-10 NOTE — Progress Notes (Signed)
Summary: Medication  Phone Note Call from Patient Call back at Home Phone [phone]   Caller: Patient Call For: Dr. Leone Payor Reason for Call: Talk to Nurse Summary of Call: Moviprep is too expensive...needs alternative med. [facility]. Initial call taken by: Karna Christmas,  September 27, 2010 11:28 AM  Follow-up for Phone Call        Called pt and left message to call office.Ulis Rias RN  September 27, 2010 1:22 PM  Follow-up by: Wyona Almas RN,  September 28, 2010 8:06 AM  Additional Follow-up for Phone Call Additional follow up Details #1::        [facility] and new instructions mailed to pt. Additional Follow-up by: Wyona Almas RN,  September 28, 2010 8:06 AM

## 2011-04-28 NOTE — Assessment & Plan Note (Signed)
[facility]                              CARDIOLOGY OFFICE NOTE   ANTUANE, EASTRIDGE                        MRN:          [id number]  DATE:10/26/2006                            DOB:          [birth year]    Harve returns today for followup.  He is doing fairly well.  He still gets  occasional exertional dyspnea.  I do not think it is cardiac in etiology.  I  initially saw him in September for atypical chest pain.  He had a normal  treadmill test.   His exercise tolerance was good and he walked for at least ten minutes.   Since I last saw him, his review of systems is remarkable for a sinus  infection, which is clearing up.  He tends to see his primary care doctors  in [place] for this.   He has stopped smoking and is now at three months with this.  I  congratulated him on it.   PHYSICAL EXAMINATION:  VITAL SIGNS:  Blood pressure is 128/70, pulse is 70  and regular.  HEENT:  Normal.  There is no lymphadenopathy, no thyromegaly, carotids are  normal.  There is no sinus tenderness to palpation.  LUNGS:  Clear.  HEART:  He has an S1, S2, normal heart sounds.  ABDOMEN:  Benign.  EXTREMITIES:  Lower extremities have intact pulses, no edema.  NEUROLOGIC:  Nonfocal.   IMPRESSION:  Atypical chest pain with exertional dyspnea, probably not  cardiac-related.  Normal exercise treadmill test.  The patient has stopped  smoking, which is excellent.  He will continue to finish his antibiotics for  sinus infection.  I encouraged him to get a CT of his sinuses, if he were to  have recurrent infections, as he seems to every year.  From a cardiac  perspective, he is stable.  We will see him back in a year.  He does not  need further workup at this time.     Noralyn Pick. Eden Emms, MD, [facility]  Electronically Signed    PCN/MedQ  DD: 10/26/2006  DT: 10/26/2006  Job #: [id number]

## 2011-04-28 NOTE — Procedures (Signed)
[facility]                                EXERCISE TREADMILL   Derrick Horn, Derrick Horn                        MRN:          [id number]  DATE:08/31/2006                            DOB:          Mar 02, 1961    PROCEDURE:  Exercise treadmill test.   A 50 year old patient with chest pain.   The patient exercised 10 minutes on the Bruce protocol. Exercise was stopped  due to fatigue.  Maximum heart rate was 171.   Note should be made that the patient's baseline heart rate in the office was  quite low at 48.  Therefore, the relative increase in heart rate was fairly  high.  The patient's baseline EKG was normal with stress.  There were no  ischemic changes.  No PVCs and no PACs.   Blood pressure response was appropriate with a peak blood pressure of  165/82.   IMPRESSION:  Normal exercise stress test with no evidence of ischemia,  hypotension, or arrhythmia.                                   Noralyn Pick. Eden Emms, MD, [facility]   PCN/MedQ  DD:  08/31/2006  DT:  09/03/2006  Job #:  [id number]

## 2011-04-28 NOTE — Assessment & Plan Note (Signed)
[facility]                             PRIMARY CARE OFFICE NOTE   Derrick Horn, Derrick Horn                        MRN:          [id number]  DATE:08/30/2006                            DOB:          1961/06/11    CHIEF COMPLAINT:  New patient in practice .   HISTORY OF PRESENT ILLNESS:  The patient is a 50 year old white male, here  to establish primary care. He has not had a primary care doctor in  [place]. He has seen a family practitioner occasionally in [name]. The patient was recently evaluated at [facility] ER on  August 29, 2006 with complaints of chest pain. The patient has been  experiencing intermittent chest pain for the last 1 month. Prior to his  emergency room evaluation, he also reported some GI complaints with nausea,  diarrhea, and vomiting. In the ER, the patient underwent workup including an  EKG, which is not available. He had a D-dimer that was negative at 0.36 and  a cardiac enzymes, one set, that was negative. The patient had a chest  x-ray in the ER, which was also reported as normal.   He reports chest pain that is described as chest tightness with some  shortness of breath. The patient first noticed it one month ago while he was  working on the computer. However, patient does report having exertional  chest pain. He was apparently working on his automobile and moving a  transmission, getting it into place for installation, when he experienced  chest tightness that resolved with rest.   The patient's chest discomfort has also occurred near bedtime. The patient  reports some orthopnea symptoms. Denies any paroxysmal nocturnal dyspnea.   He does have rather a strong family history of coronary artery disease. The  patient reports that his father had an abnormal cardiac testing and  ultimately underwent catheterization and had 2 stents placed. This was at  age 31. His paternal grandfather is noted to have  early coronary artery  disease with a myocardial infarction in his 10's. His mother is alive at age  67 and is in fairly good health.   SOCIAL HISTORY:  He is a Airline pilot person, Camera operator. He is married. He  has a 44 year old daughter.   FAMILY HISTORY:  As noted above.   HABITS:  He does not drink. No history of recreational drug use. He quit  smoking approximately 3 1/2 weeks ago. He has smoked 1/2 pack per day since  age 70.   REVIEW OF SYSTEMS:  No fever or chills. No HEENT symptoms. Cardiovascular  complaints as noted above. No palpitations. Positive shortness of breath.  Occasional cough. The patient does report heartburn symptoms with spicy  foods. No history of diabetes and all other systems negative.   PHYSICAL EXAMINATION:  VITAL SIGNS:  Height 5 foot 11. Weight 174 pounds.  Temperature 97, pulse 52. Blood pressure 131/82 in the left arm in the  seated position.  GENERAL:  A pleasant, well developed, well nourished, 50 year old white male  in no acute distress.  HEENT:  Normocephalic and atraumatic. Pupils are equal, round, and reactive  to light bilaterally. Extraocular muscles intact. The patient was anicteric.  Conjunctiva within normal limits. External auditory canals and tympanic  membranes clear bilaterally.  NECK:  Supple. No adenopathy, carotid bruit, or thyromegaly.  CHEST:  Normal expiratory effort. Clear to auscultation bilaterally. No  rhonchi, rales, or wheezing.  CARDIOVASCULAR:  Regular rate and rhythm. No significant murmurs, rubs, or  gallops appreciated.  ABDOMEN:  Soft, nontender. Positive bowel sounds. No organomegaly.  MUSCULOSKELETAL:  No clubbing, cyanosis, or edema. The patient had intact  pedis dorsalis. Pulses equal and symmetric.  SKIN:  Warm and dry.  NEUROLOGIC:  Cranial nerves 2-12 are grossly intact. Non-focal.   LABORATORY DATA:  EKG was performed in the office, which showed sinus  bradycardia at 45 beats per minute. The patient did  not have any acute ST  changes. He did have Q wave noted in lead 3, small Q wave in 2 and AVF of  unclear significance.   IMPRESSION:  1. Chest pain with atypical and also typical features.  2. Family history of coronary artery disease.  3. History of tobacco abuse.   RECOMMENDATIONS:  Due to patient's risk factors and family history, he will  be urgently referred to [facility]. He was given an appointment to  see Dr. Eden Emms at 10:30 Friday morning. He was advised to start an aspirin  325 mg once daily. I will defer to cardiology, whether he needs  cardiovascular testing such as stress test versus cardiac catheterization.   I recommended a followup examination, labs for risk stratification including  lipid profile, and fasting blood sugars.   Followup time is in approximately 2 to 4 weeks.                                   Barbette Hair. Artist Pais, DO   RDY/MedQ  DD:  08/30/2006  DT:  09/01/2006  Job #:  [id number]

## 2011-04-28 NOTE — Assessment & Plan Note (Signed)
[facility]                              CARDIOLOGY OFFICE NOTE   SHAWNTE, DEMAREST                        MRN:          [id number]  DATE:08/31/2006                            DOB:          1961-04-01    Mr. Noe is seen today as a new patient. He was recently seen in the  emergency room at [facility].   He had some atypical chest pain. The pain was not particularly exertional  and felt like a pressure in his chest. He had some numbness and tingling in  his arms. There was no associated palpitations or syncope. He did have some  exertional dyspnea.   The patient quit smoking about a month ago. He says he feels horrible since  he did this and it sounds like he has been somewhat prone to problems with  decreasing nicotine levels and the increased ciliary clearance in regards to  his previous smoking.   He has not had any significant fevers.   In fact, while we were sitting here in the office he said his chest felt a  little bit tight. There has been no audible wheezing.   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   From a cardiac perspective, there is a markedly positive family history with  brothers and father having MIs in their 38s and early 29s.   The patient has not had a previous stress test.   He is allergic to AZITHROMYCIN which causes throat swelling.   He really has not had problems with chest pain prior to the last couple of  weeks.   He has a 20+ year history of smoking and quit a month ago. He has maybe a  beer a day and a cup of coffee and a soda a day.   Review of systems is otherwise unremarkable. He is happily married, his wife  was with him today. She seems very attentive. They have a 21 year old  daughter who is in special education.   The patient does admit to being at some stress with his work. He handles  high volume sales calls. He has had previous  carpal tunnel surgery in 1993.   He is not taking any medicines on a routine basis.   PHYSICAL EXAMINATION:  GENERAL:  He looks well.  VITAL SIGNS:  Blood pressure is 120/70, pulse 70 and regular.  LUNGS:  Clear.  Carotids clear __________  HEART:  Normal heart sounds.  ABDOMEN:  Benign.  EXTREMITIES:  Lower extremity intact pulses, no edema.   Baseline EKG is normal. His heart rate actually at rest is quite low, as low  as 48 but increases quickly with any kind of activity.   IMPRESSION:  Atypical chest pain __________  positive family history. I told  the patient that since his EKG is entirely normal, I would get him on the  treadmill today as an initial attempt to rule out coronary disease. As long  as his treadmill test is normal, I think that he can be  followed  expeditiously in the future.   He would be an excellent candidate for a cardiac PTA given his relatively  thin status and slow heart rate.   If he were to have persistent symptoms despite a normal treadmill test, I  would recommend this as a followup.   Further recommendations will be based on the results of his treadmill test.                               Theron Arista C. Eden Emms, MD, [facility]    PCN/MedQ  DD:  08/31/2006  DT:  09/03/2006  Job #:  [id number]

## 2011-06-03 LAB — LIPID PANEL
Cholesterol, Total: 221
Direct LDL: 137
HDL: 67 mg/dL (ref 35–70)
Triglycerides: 83

## 2011-06-03 LAB — COMPREHENSIVE METABOLIC PANEL
ALT: 29 U/L (ref 10–40)
AST: 25 U/L
Albumin: 4.7
Alkaline Phosphatase: 101 U/L
BUN: 15 mg/dL (ref 4–21)
Creat: 1.26
GGT: 19 U/L (ref 18–76)
Glucose: 60
Total Protein: 7.3 g/dL

## 2011-07-10 ENCOUNTER — Encounter: Payer: Self-pay | Admitting: Family Medicine

## 2011-07-12 ENCOUNTER — Ambulatory Visit: Payer: Self-pay | Admitting: Family Medicine

## 2011-07-13 ENCOUNTER — Encounter: Payer: Self-pay | Admitting: Family Medicine

## 2011-07-14 ENCOUNTER — Encounter: Payer: Self-pay | Admitting: Family Medicine

## 2011-07-14 ENCOUNTER — Ambulatory Visit (INDEPENDENT_AMBULATORY_CARE_PROVIDER_SITE_OTHER): Payer: PRIVATE HEALTH INSURANCE | Admitting: Family Medicine

## 2011-07-14 VITALS — BP 110/74 | HR 68 | Temp 98.3°F | Wt 179.5 lb

## 2011-07-14 DIAGNOSIS — Z125 Encounter for screening for malignant neoplasm of prostate: Secondary | ICD-10-CM

## 2011-07-14 DIAGNOSIS — K219 Gastro-esophageal reflux disease without esophagitis: Secondary | ICD-10-CM

## 2011-07-14 DIAGNOSIS — Z8679 Personal history of other diseases of the circulatory system: Secondary | ICD-10-CM

## 2011-07-14 DIAGNOSIS — E785 Hyperlipidemia, unspecified: Secondary | ICD-10-CM

## 2011-07-14 DIAGNOSIS — E663 Overweight: Secondary | ICD-10-CM

## 2011-07-14 DIAGNOSIS — Z0000 Encounter for general adult medical examination without abnormal findings: Secondary | ICD-10-CM | POA: Insufficient documentation

## 2011-07-14 DIAGNOSIS — F17200 Nicotine dependence, unspecified, uncomplicated: Secondary | ICD-10-CM

## 2011-07-14 NOTE — Assessment & Plan Note (Addendum)
Discussed prostate screening.  Pt would like to continue screening despite being low risk. DRE reassuring today.   Check PSA when returns fasting in 6 mo.

## 2011-07-14 NOTE — Assessment & Plan Note (Signed)
motivated to quit. Using 1/2 nicotine patch. Discussed CV benefits of quitting.

## 2011-07-14 NOTE — Assessment & Plan Note (Signed)
Came in for CPE however not due for as last done 08/05/2011.

## 2011-07-14 NOTE — Assessment & Plan Note (Addendum)
LDL 137 on recent insurance blood work. Discussed. framingham of 9%, discussed statin would lower by 2-3 % but if stops smoking, framingham down to 4%. Focus efforts on quitting smoking.  Pt motivated. Provided with low chol diet handout

## 2011-07-14 NOTE — Patient Instructions (Signed)
Return in 6 months to recheck cholesterol.  Return a few days prior fasting for blood work. Low cholesterol diet provided today. Lets concentrate on quitting smoking now.  Will reassess cholesterol levels in 6 months. Good to see yoo today, call us with questions.

## 2011-07-14 NOTE — Progress Notes (Signed)
Subjective:    Patient ID: Derrick Horn, male    DOB: October 29, 1961, 50 y.o.   MRN: [id number]  HPI CC: high cholesterol.  "my wife made me come in".  Would also like CPE today.  Quit smoking 4 months, then restarted.  First granddaughter due middle of December, thinks this will be motivation to help him quit.  Down to 2-4 cig/day.  Has nicotine patches.  Dyslipidemia - had life insurance check labs - cholesterol levels returned elevated.  HDL 66.8.  LDL 137.  TC 221.  LFTs normal.  Lab Results  Component Value Date   CHOL 168 08/04/2010   Lab Results  Component Value Date   HDL 36.70* 08/04/2010   Lab Results  Component Value Date   LDLCALC 114* 08/04/2010   Lab Results  Component Value Date   TRIG 89.0 08/04/2010  some family hx CAD/MI - father with CAD 65yo.  Grandfather with CAD 35yo.  Thinking about P90X to lose weight and get in shape.  Preventative: Colonoscopy ok last year (10/2010) rpt 10 yrs. Prostate check last year WNL, PSA 1.02, no nocturia, no weak stream.  Would like to continue screening. Tdap 2011.  Medications and allergies reviewed and updated in chart. Patient Active Problem List  Diagnoses  . OVERWEIGHT  . SMOKER  . LUNG NODULE  . GERD  . LOW BACK PAIN, CHRONIC  . DYSPNEA  . CHEST PAIN, HX OF  . CARPAL TUNNEL RELEASE, HX OF   Past Medical History  Diagnosis Date  . Smoker   . History of chest pain 2010    stress test WNL  . GERD (gastroesophageal reflux disease)     mild  . Chronic LBP   . Recurrent sinus infections   . HLD (hyperlipidemia)     mild   Past Surgical History  Procedure Date  . Carpal tunnel release     right  . Ett 2010    WNL (Dr. Eden Emms)   History  Substance Use Topics  . Smoking status: Current Everyday Smoker  . Smokeless tobacco: Not on file   Comment: "couple cigarettes a day"  . Alcohol Use: No   Family History  Problem Relation Age of Onset  . Coronary artery disease Maternal Grandfather 35  . Stroke  Maternal Grandfather   . Coronary artery disease Father 71    s/p stents  . Uterine cancer Maternal Grandmother     grandmother   Allergies  Allergen Reactions  . Azithromycin     REACTION: bradycardia, hypotension,syncope, 3 days in hospital   Current Outpatient Prescriptions on File Prior to Visit  Medication Sig Dispense Refill  . celecoxib (CELEBREX) 200 MG capsule Take 200 mg by mouth as needed.        . famotidine (PEPCID) 20 MG tablet Take 20 mg by mouth as needed.         Review of Systems  Constitutional: Negative for fever, chills, activity change, appetite change, fatigue and unexpected weight change.  HENT: Negative for hearing loss and neck pain.   Eyes: Negative for visual disturbance.  Respiratory: Negative for cough, chest tightness, shortness of breath and wheezing.   Cardiovascular: Negative for chest pain, palpitations and leg swelling.  Gastrointestinal: Negative for nausea, vomiting, abdominal pain, diarrhea, constipation, blood in stool and abdominal distention.  Genitourinary: Negative for hematuria and difficulty urinating.  Musculoskeletal: Negative for myalgias and arthralgias.  Skin: Negative for rash.  Neurological: Negative for dizziness, seizures, syncope and headaches.  Hematological: Does not  bruise/bleed easily.  Psychiatric/Behavioral: Negative for dysphoric mood. The patient is not nervous/anxious.      Objective:   Physical Exam  Nursing note and vitals reviewed. Constitutional: He is oriented to person, place, and time. He appears well-developed and well-nourished. No distress.  HENT:  Head: Normocephalic and atraumatic.  Right Ear: External ear normal.  Left Ear: External ear normal.  Nose: Nose normal.  Mouth/Throat: Oropharynx is clear and moist.  Eyes: Conjunctivae and EOM are normal. Pupils are equal, round, and reactive to light.  Neck: Normal range of motion. Neck supple. Carotid bruit is not present.  Cardiovascular: Normal rate,  regular rhythm, normal heart sounds and intact distal pulses.   No murmur heard. Pulses:      Radial pulses are 2+ on the right side, and 2+ on the left side.  Pulmonary/Chest: Effort normal and breath sounds normal. No respiratory distress. He has no wheezes. He has no rales.  Abdominal: Soft. Bowel sounds are normal. He exhibits no distension and no mass. There is no tenderness. There is no rebound and no guarding.  Genitourinary: Prostate normal. Rectal exam shows external hemorrhoid. Rectal exam shows no internal hemorrhoid, no fissure, no mass, no tenderness and anal tone normal. Prostate is not tender.       Minimally enlarged prostate  Musculoskeletal: Normal range of motion.  Lymphadenopathy:    He has no cervical adenopathy.  Neurological: He is alert and oriented to person, place, and time.       CN grossly intact, station and gait intact  Skin: Skin is warm and dry. No rash noted.  Psychiatric: He has a normal mood and affect. His behavior is normal. Judgment and thought content normal.      Assessment & Plan:

## 2011-07-19 ENCOUNTER — Encounter: Payer: Self-pay | Admitting: Family Medicine

## 2011-10-05 ENCOUNTER — Other Ambulatory Visit: Payer: PRIVATE HEALTH INSURANCE

## 2011-10-10 ENCOUNTER — Encounter: Payer: PRIVATE HEALTH INSURANCE | Admitting: Family Medicine

## 2011-10-11 ENCOUNTER — Encounter: Payer: Self-pay | Admitting: Family Medicine

## 2011-10-11 ENCOUNTER — Ambulatory Visit (INDEPENDENT_AMBULATORY_CARE_PROVIDER_SITE_OTHER): Payer: PRIVATE HEALTH INSURANCE | Admitting: Family Medicine

## 2011-10-11 VITALS — BP 118/78 | HR 80 | Temp 97.8°F | Wt 185.0 lb

## 2011-10-11 DIAGNOSIS — Z0000 Encounter for general adult medical examination without abnormal findings: Secondary | ICD-10-CM

## 2011-10-11 DIAGNOSIS — Z125 Encounter for screening for malignant neoplasm of prostate: Secondary | ICD-10-CM

## 2011-10-11 DIAGNOSIS — R5383 Other fatigue: Secondary | ICD-10-CM

## 2011-10-11 DIAGNOSIS — E785 Hyperlipidemia, unspecified: Secondary | ICD-10-CM

## 2011-10-11 DIAGNOSIS — Z23 Encounter for immunization: Secondary | ICD-10-CM

## 2011-10-11 DIAGNOSIS — F17200 Nicotine dependence, unspecified, uncomplicated: Secondary | ICD-10-CM

## 2011-10-11 DIAGNOSIS — R5381 Other malaise: Secondary | ICD-10-CM

## 2011-10-11 LAB — PSA: PSA: 1.23 ng/mL (ref 0.10–4.00)

## 2011-10-11 LAB — BASIC METABOLIC PANEL
BUN: 15 mg/dL (ref 6–23)
CO2: 28 mEq/L (ref 19–32)
Calcium: 9.2 mg/dL (ref 8.4–10.5)
Chloride: 107 mEq/L (ref 96–112)
Creatinine, Ser: 1.1 mg/dL (ref 0.4–1.5)
GFR: 79.04 mL/min (ref >60.00)
Glucose, Bld: 100 mg/dL — ABNORMAL HIGH (ref 70–99)
Potassium: 4.8 mEq/L (ref 3.5–5.1)
Sodium: 140 mEq/L (ref 135–145)

## 2011-10-11 LAB — LIPID PANEL
Cholesterol: 192 mg/dL (ref 0–200)
HDL: 46.7 mg/dL (ref >39.00)
LDL Cholesterol: 132 mg/dL — ABNORMAL HIGH (ref 0–99)
Total CHOL/HDL Ratio: 4
Triglycerides: 69 mg/dL (ref 0.0–149.0)
VLDL: 13.8 mg/dL (ref 0.0–40.0)

## 2011-10-11 LAB — TESTOSTERONE: Testosterone: 606.56 ng/dL (ref 350.00–890.00)

## 2011-10-11 NOTE — Assessment & Plan Note (Signed)
Reviewed preventative protocols, updated. DRE reassuring 07/2011.  PSA today. Blood work today. discussed healthy lifestyle, diet. Flu today. UTD colonoscopy 2011, rpt due 10 yrs.

## 2011-10-11 NOTE — Patient Instructions (Signed)
Blood work today. Flu shot today. Continue to work on regular exercise - start at 30 min/day 3-4 days/wk, then slowly increase as tolerated.  If any chest pain, please stop and let us know. Keep working on smoking. Good to see you today.

## 2011-10-11 NOTE — Assessment & Plan Note (Signed)
Check testosterone. If abnl, further eval.

## 2011-10-11 NOTE — Assessment & Plan Note (Signed)
Check FLP today. Strong family hx, goal would be <130, ideally <100. Has changed diet some.

## 2011-10-11 NOTE — Assessment & Plan Note (Signed)
Continue to encourage cessation. Motivated.

## 2011-10-11 NOTE — Progress Notes (Signed)
Subjective:    Patient ID: Derrick Horn, male    DOB: Jun 16, 1961, 50 y.o.   MRN: [id number]  HPI CC: CPE  Here for cpe today.  Also - no energy, fatigued, libido down.  Denies depression. Tried energy pill from [facility] - testosterone booster?  Took a few 1 1/2 wks ago, felt racy.  would like T checked.  Working on quitting smoking - on patch currently.  1-2 cig/day.  Motivated to quit by time granddaughter is born (11/2011)  Would like cholesterol rechecked.  Activity: shooting, yardwork, no regular exercise Diet: more salads, more oatmeal  Wt Readings from Last 3 Encounters:  10/11/11 185 lb (83.915 kg)  07/14/11 179 lb 8 oz (81.421 kg)  08/25/10 186 lb 12 oz (84.709 kg)   Preventative: Colonoscopy ok last year (10/2010) rpt 10 yrs.  Prostate - DRE 07/2011 ok.  PSA due.  PSA (2011) 1.02, no nocturia, no weak stream. Would like to continue screening.  Tdap 2011. Flu shot - today.  Medications and allergies reviewed and updated in chart.  Past histories reviewed and updated if relevant as below. Patient Active Problem List  Diagnoses  . Overweight  . SMOKER  . GERD  . LOW BACK PAIN, CHRONIC  . CHEST PAIN, HX OF  . Special screening for malignant neoplasm of prostate  . Healthcare maintenance  . Dyslipidemia   Past Medical History  Diagnosis Date  . Smoker   . History of chest pain 2010    stress test WNL  . GERD (gastroesophageal reflux disease)     mild  . Chronic LBP   . Recurrent sinus infections   . HLD (hyperlipidemia)     mild   Past Surgical History  Procedure Date  . Carpal tunnel release     right  . Ett 2010    WNL (Dr. Eden Emms)  . Chest ct 2011    no pulm nodules, mild apical scarring/emphysematous changes   History  Substance Use Topics  . Smoking status: Current Everyday Smoker    Types: Cigarettes  . Smokeless tobacco: Not on file   Comment: "couple cigarettes a day"  . Alcohol Use: No   Family History  Problem Relation Age of  Onset  . Coronary artery disease Maternal Grandfather 35  . Stroke Maternal Grandfather   . Coronary artery disease Father 55    s/p stents  . Uterine cancer Maternal Grandmother     grandmother   Allergies  Allergen Reactions  . Azithromycin     REACTION: bradycardia, hypotension,syncope, 3 days in hospital   No current outpatient prescriptions on file prior to visit.   Review of Systems  Constitutional: Negative for fever, chills, activity change, appetite change, fatigue and unexpected weight change.  HENT: Negative for hearing loss and neck pain.   Eyes: Negative for visual disturbance.  Respiratory: Negative for cough, chest tightness, shortness of breath and wheezing.   Cardiovascular: Negative for chest pain, palpitations and leg swelling.  Gastrointestinal: Negative for nausea, vomiting, abdominal pain, diarrhea, constipation, blood in stool and abdominal distention.  Genitourinary: Negative for hematuria and difficulty urinating.  Musculoskeletal: Negative for myalgias and arthralgias.  Skin: Negative for rash.  Neurological: Negative for dizziness, seizures, syncope and headaches.  Hematological: Does not bruise/bleed easily.  Psychiatric/Behavioral: Negative for dysphoric mood. The patient is not nervous/anxious.        Objective:   Physical Exam  Nursing note and vitals reviewed. Constitutional: He is oriented to person, place, and time. He  appears well-developed and well-nourished. No distress.  HENT:  Head: Normocephalic and atraumatic.  Right Ear: Hearing, tympanic membrane, external ear and ear canal normal.  Left Ear: Hearing, tympanic membrane, external ear and ear canal normal.  Nose: Nose normal. No mucosal edema or rhinorrhea.  Mouth/Throat: Uvula is midline, oropharynx is clear and moist and mucous membranes are normal. No oropharyngeal exudate, posterior oropharyngeal edema, posterior oropharyngeal erythema or tonsillar abscesses.  Eyes: Conjunctivae  and EOM are normal. Pupils are equal, round, and reactive to light. No scleral icterus.  Neck: Normal range of motion. Neck supple. No thyromegaly present.  Cardiovascular: Normal rate, regular rhythm, normal heart sounds and intact distal pulses.   No murmur heard. Pulses:      Radial pulses are 2+ on the right side, and 2+ on the left side.  Pulmonary/Chest: Effort normal and breath sounds normal. No respiratory distress. He has no wheezes. He has no rales.  Abdominal: Soft. Bowel sounds are normal. He exhibits no distension and no mass. There is no tenderness. There is no rebound and no guarding.  Musculoskeletal: Normal range of motion.  Lymphadenopathy:    He has no cervical adenopathy.  Neurological: He is alert and oriented to person, place, and time.       CN grossly intact, station and gait intact  Skin: Skin is warm and dry. No rash noted.  Psychiatric: He has a normal mood and affect. His behavior is normal. Judgment and thought content normal.      Assessment & Plan:

## 2012-01-09 ENCOUNTER — Other Ambulatory Visit: Payer: Self-pay | Admitting: Family Medicine

## 2012-01-09 DIAGNOSIS — E785 Hyperlipidemia, unspecified: Secondary | ICD-10-CM

## 2012-01-17 ENCOUNTER — Other Ambulatory Visit: Payer: PRIVATE HEALTH INSURANCE

## 2012-01-19 ENCOUNTER — Ambulatory Visit: Payer: PRIVATE HEALTH INSURANCE | Admitting: Family Medicine

## 2012-01-19 DIAGNOSIS — Z0289 Encounter for other administrative examinations: Secondary | ICD-10-CM

## 2012-02-19 ENCOUNTER — Telehealth: Payer: Self-pay | Admitting: Family Medicine

## 2012-02-19 NOTE — Telephone Encounter (Signed)
Patient was sent a no show bill for $50.  The date of service was 01/19/12.  Patient made appointment in August for the follow up and since then he scheduled a cpx in October,2012.  Patient forgot about the follow up appointment and he moved since he scheduled the appointment for February.  He,also,has a new home phone number,so he didn't receive the reminder call.  Patient wants to know if he can be excused from the $50 charge.

## 2012-02-21 NOTE — Telephone Encounter (Signed)
Sent to charge adjustment to remove charge.  Was the phone number and address updated for the patient since he told you he moved?  Thanks

## 2012-02-21 NOTE — Telephone Encounter (Signed)
Yes,the demographics were updated.

## 2012-06-05 ENCOUNTER — Ambulatory Visit: Payer: PRIVATE HEALTH INSURANCE | Admitting: Family Medicine

## 2012-06-18 ENCOUNTER — Encounter: Payer: Self-pay | Admitting: Family Medicine

## 2012-06-18 ENCOUNTER — Ambulatory Visit (INDEPENDENT_AMBULATORY_CARE_PROVIDER_SITE_OTHER): Payer: PRIVATE HEALTH INSURANCE | Admitting: Family Medicine

## 2012-06-18 VITALS — BP 116/80 | HR 64 | Temp 98.1°F | Ht 68.5 in | Wt 182.8 lb

## 2012-06-18 DIAGNOSIS — F17200 Nicotine dependence, unspecified, uncomplicated: Secondary | ICD-10-CM

## 2012-06-18 MED ORDER — VARENICLINE TARTRATE 1 MG PO TABS: 1.0000 mg | ORAL_TABLET | Freq: Two times a day (BID) | ORAL | Status: AC

## 2012-06-18 MED ORDER — VARENICLINE TARTRATE 0.5 MG X 11 & 1 MG X 42 PO MISC: ORAL | Status: AC

## 2012-06-18 NOTE — Patient Instructions (Addendum)
Nicotine inhaler samples provided today. chantix prescription provided today. Good to see you, call us with questions. Look into [email or web address]. Return in 4-6 weeks for follow up.

## 2012-06-18 NOTE — Assessment & Plan Note (Addendum)
Discussed different forms of smoking cessation, encouraged cessation. Continue patch.  Also provided with samples of nicotine inhaler and discussed use of nicotine gum. Pt requests trial of chantix - discussed common side effects including but not limited to nausea/vomiting, vivid dreams, behaviour changes, discussed possible CV risk association.  If any chest pain/tightness, to stop med and notify us immediately To try inhaler first, if not effective then may try chantix. rtc 1 mo for f/u. chantix coupon provided as well as quitline [email or web address] resources

## 2012-06-18 NOTE — Progress Notes (Signed)
  Subjective:    Patient ID: Derrick Horn, male    DOB: 1961/02/06, 51 y.o.   MRN: [id number]  HPI CC: smoking cessation  Smoking about 1/4 ppd.  Very motivated but trouble abstaining.  Has quit for 1-2 yrs in past, but always has restarted.  Using patch currently.  Has not used anything else for quitting smoking.    Denies fevers/chills, cough, sob.  No weight changes. Wt Readings from Last 3 Encounters:  06/18/12 182 lb 12 oz (82.895 kg)  10/11/11 185 lb (83.915 kg)  07/14/11 179 lb 8 oz (81.421 kg)    Past Medical History  Diagnosis Date  . Smoker   . History of chest pain 2010    stress test WNL  . GERD (gastroesophageal reflux disease)     mild  . Chronic LBP   . Recurrent sinus infections   . HLD (hyperlipidemia)     mild     Review of Systems Per HPI    Objective:   Physical Exam WDWN, NAD    Assessment & Plan:

## 2012-09-24 ENCOUNTER — Ambulatory Visit (INDEPENDENT_AMBULATORY_CARE_PROVIDER_SITE_OTHER): Payer: 59

## 2012-09-24 DIAGNOSIS — Z23 Encounter for immunization: Secondary | ICD-10-CM

## 2013-01-31 ENCOUNTER — Ambulatory Visit (INDEPENDENT_AMBULATORY_CARE_PROVIDER_SITE_OTHER): Payer: PRIVATE HEALTH INSURANCE | Admitting: Family Medicine

## 2013-01-31 ENCOUNTER — Encounter: Payer: Self-pay | Admitting: Family Medicine

## 2013-01-31 VITALS — BP 114/82 | HR 66 | Temp 97.9°F | Wt 190.8 lb

## 2013-01-31 DIAGNOSIS — F17200 Nicotine dependence, unspecified, uncomplicated: Secondary | ICD-10-CM

## 2013-01-31 DIAGNOSIS — J441 Chronic obstructive pulmonary disease with (acute) exacerbation: Secondary | ICD-10-CM

## 2013-01-31 MED ORDER — PREDNISONE 20 MG PO TABS: ORAL_TABLET | ORAL | Status: DC

## 2013-01-31 MED ORDER — ALBUTEROL SULFATE HFA 108 (90 BASE) MCG/ACT IN AERS: 2.0000 | INHALATION_SPRAY | Freq: Four times a day (QID) | RESPIRATORY_TRACT | Status: DC | PRN

## 2013-01-31 MED ORDER — DOXYCYCLINE HYCLATE 100 MG PO CAPS: 100.0000 mg | ORAL_CAPSULE | Freq: Two times a day (BID) | ORAL | Status: DC

## 2013-01-31 NOTE — Assessment & Plan Note (Signed)
Encouraged abstinence.  

## 2013-01-31 NOTE — Assessment & Plan Note (Signed)
Anticipate COPD exacerbation - mild emphysematous changes in apices found on CT scan 08/2010. Treat with doxy course, and steroid course.  Advised to finish all meds.  Also treat with albuterol prn. Congratulated on quitting smoking. Red flags to return discussed.

## 2013-01-31 NOTE — Patient Instructions (Signed)
You do have wheezing today - I think your sinusitis has led to reactive airway flare, possible COPD.   Treat with antibiotic, steroid course and albuterol inhaler. If still feeling a bit wheezy or short of breat, return in 1 month for spirometry (breathing test) to see how your lungs are doing.

## 2013-01-31 NOTE — Progress Notes (Signed)
  Subjective:    Patient ID: Derrick Horn, male    DOB: 1961-04-24, 52 y.o.   MRN: [id number]  HPI CC: trouble breathing  Had sinus infection 10/2012 - self treated with left over augmentin.  sxs improved, but then about 1 mo ago started having sinus congestion again.  Now for last several weeks waking up wheezing and short of breath.  Some wheezing, short of breath, cough overall dry.  Feels rattling in chest.  Head > chest congestion.  + PNdrainage.  Blowing nose with clear mucous.  No fevers/chills, abd pain, headaches, ST, ear or tooth pain.  Quit smoking 12/2012!  Used nicotine inhaler to help him quit.  Continues to feel urge to smoke. No h/o asthma/COPD. + h/o recurrent sinus infection.  Past Medical History  Diagnosis Date  . Ex-smoker     quit 12/2012  . History of chest pain 2010    stress test WNL  . GERD (gastroesophageal reflux disease)     mild  . Chronic LBP   . Recurrent sinus infections   . HLD (hyperlipidemia)     mild     Review of Systems Per HPI    Objective:   Physical Exam  Nursing note and vitals reviewed. Constitutional: He appears well-developed and well-nourished. No distress.  HENT:  Head: Normocephalic and atraumatic.  Right Ear: Hearing, tympanic membrane, external ear and ear canal normal.  Left Ear: Hearing, tympanic membrane, external ear and ear canal normal.  Nose: No mucosal edema or rhinorrhea. Right sinus exhibits maxillary sinus tenderness and frontal sinus tenderness. Left sinus exhibits no maxillary sinus tenderness and no frontal sinus tenderness.  Mouth/Throat: Uvula is midline, oropharynx is clear and moist and mucous membranes are normal. No oropharyngeal exudate, posterior oropharyngeal edema, posterior oropharyngeal erythema or tonsillar abscesses.  Eyes: Conjunctivae and EOM are normal. Pupils are equal, round, and reactive to light. No scleral icterus.  Neck: Normal range of motion. Neck supple.  Cardiovascular: Normal rate,  regular rhythm, normal heart sounds and intact distal pulses.   No murmur heard. Pulmonary/Chest: Effort normal. No respiratory distress. He has decreased breath sounds (tight). He has wheezes in the right upper field and the left upper field. He has no rales.  Lymphadenopathy:    He has no cervical adenopathy.  Skin: Skin is warm and dry. No rash noted.       Assessment & Plan:

## 2013-02-07 ENCOUNTER — Other Ambulatory Visit: Payer: Self-pay | Admitting: Family Medicine

## 2013-02-07 DIAGNOSIS — E785 Hyperlipidemia, unspecified: Secondary | ICD-10-CM

## 2013-02-07 DIAGNOSIS — Z125 Encounter for screening for malignant neoplasm of prostate: Secondary | ICD-10-CM

## 2013-02-10 ENCOUNTER — Other Ambulatory Visit (INDEPENDENT_AMBULATORY_CARE_PROVIDER_SITE_OTHER): Payer: PRIVATE HEALTH INSURANCE

## 2013-02-10 DIAGNOSIS — E785 Hyperlipidemia, unspecified: Secondary | ICD-10-CM

## 2013-02-10 DIAGNOSIS — Z125 Encounter for screening for malignant neoplasm of prostate: Secondary | ICD-10-CM

## 2013-02-10 LAB — BASIC METABOLIC PANEL
BUN: 19 mg/dL (ref 6–23)
CO2: 26 mEq/L (ref 19–32)
Calcium: 9.2 mg/dL (ref 8.4–10.5)
Chloride: 105 mEq/L (ref 96–112)
Creatinine, Ser: 1 mg/dL (ref 0.4–1.5)
GFR: 79.5 mL/min (ref >60.00)
Glucose, Bld: 110 mg/dL — ABNORMAL HIGH (ref 70–99)
Potassium: 3.9 mEq/L (ref 3.5–5.1)
Sodium: 139 mEq/L (ref 135–145)

## 2013-02-10 LAB — LIPID PANEL
Cholesterol: 178 mg/dL (ref 0–200)
HDL: 57.4 mg/dL (ref >39.00)
LDL Cholesterol: 101 mg/dL — ABNORMAL HIGH (ref 0–99)
Total CHOL/HDL Ratio: 3
Triglycerides: 97 mg/dL (ref 0.0–149.0)
VLDL: 19.4 mg/dL (ref 0.0–40.0)

## 2013-02-10 LAB — PSA: PSA: 1.13 ng/mL (ref 0.10–4.00)

## 2013-02-13 ENCOUNTER — Other Ambulatory Visit: Payer: 59

## 2013-02-20 ENCOUNTER — Encounter: Payer: Self-pay | Admitting: Family Medicine

## 2013-02-20 ENCOUNTER — Ambulatory Visit (INDEPENDENT_AMBULATORY_CARE_PROVIDER_SITE_OTHER): Payer: PRIVATE HEALTH INSURANCE | Admitting: Family Medicine

## 2013-02-20 VITALS — BP 114/76 | HR 72 | Temp 98.4°F | Ht 70.0 in | Wt 190.0 lb

## 2013-02-20 DIAGNOSIS — R7309 Other abnormal glucose: Secondary | ICD-10-CM

## 2013-02-20 DIAGNOSIS — R739 Hyperglycemia, unspecified: Secondary | ICD-10-CM | POA: Insufficient documentation

## 2013-02-20 DIAGNOSIS — J069 Acute upper respiratory infection, unspecified: Secondary | ICD-10-CM | POA: Insufficient documentation

## 2013-02-20 DIAGNOSIS — Z87891 Personal history of nicotine dependence: Secondary | ICD-10-CM

## 2013-02-20 DIAGNOSIS — Z0000 Encounter for general adult medical examination without abnormal findings: Secondary | ICD-10-CM

## 2013-02-20 DIAGNOSIS — E785 Hyperlipidemia, unspecified: Secondary | ICD-10-CM

## 2013-02-20 NOTE — Progress Notes (Signed)
Subjective:    Patient ID: Derrick Horn, male    DOB: [birth year], 52 y.o.   MRN: [id number]  HPI CC: CPE  Mr Valley presents today for CPE  Recently treated for COPD exacerbation with steroid course and doxy course however didn't complete full doxy course. 2 nights ago rhinorhea restarted, yesterday malaise and fatigue.  No cough.  More head congestion, PNdrainage and sore throat.  Yesterday with chills. No fevers/chills, wheezing or SOB.  Using albuterol inhaler prn. Recent traveling.  Preventative:  Colonoscopy ok, 2 diminutive polyps.  (10/2010) rpt 10 yrs Leone Payor). Prostate - no nocturia, no weak stream. Would like to continue screening. Flu shot - 09/2012. 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 1 dog Activity: shooting, yardwork, no regular exercise Diet: more salads, more oatmeal, good water  Medications and allergies reviewed and updated in chart.  Past histories reviewed and updated if relevant as below. Patient Active Problem List  Diagnosis  . Overweight  . Ex-smoker  . GERD  . LOW BACK PAIN, CHRONIC  . CHEST PAIN, HX OF  . Special screening for malignant neoplasm of prostate  . Healthcare maintenance  . Dyslipidemia  . Fatigue  . COPD exacerbation   Past Medical History  Diagnosis Date  . Ex-smoker     quit 12/2012  . History of chest pain 2010    stress test WNL  . GERD (gastroesophageal reflux disease)     mild  . Chronic LBP   . Recurrent sinus infections   . HLD (hyperlipidemia)     mild  . COPD (chronic obstructive pulmonary disease)     minimal emphysema on CT scan   Past Surgical History  Procedure Laterality Date  . Carpal tunnel release      right  . Ett  2010    WNL (Dr. Eden Emms)  . Chest ct  2011    no pulm nodules, mild apical scarring/emphysematous changes  . Colonoscopy  09/2010    2 hyperplastic polyps Leone Payor)   History  Substance  Use Topics  . Smoking status: Former Smoker    Types: Cigarettes    Quit date: 12/11/2012  . Smokeless tobacco: Not on file  . Alcohol Use: No   Family History  Problem Relation Age of Onset  . Coronary artery disease Maternal Grandfather 35  . Stroke Maternal Grandfather   . Coronary artery disease Father 24    s/p stents, CABG  . Uterine cancer Maternal Grandmother     grandmother   Allergies  Allergen Reactions  . Azithromycin     REACTION: bradycardia, hypotension,syncope, 3 days in hospital   Current Outpatient Prescriptions on File Prior to Visit  Medication Sig Dispense Refill  . albuterol (PROVENTIL HFA;VENTOLIN HFA) 108 (90 BASE) MCG/ACT inhaler Inhale 2 puffs into the lungs every 6 (six) hours as needed for wheezing.  1 Inhaler  0  . aspirin 81 MG tablet Take 81 mg by mouth daily.      . Multiple Vitamin (MULTIVITAMIN PO) Take 1 tablet by mouth daily.        . Omega-3 Fatty Acids (FISH OIL PO) Take 1 capsule by mouth daily.       No current facility-administered medications on file prior to visit.   Review of Systems  Constitutional: Positive for chills. Negative for fever, activity change, appetite change, fatigue and unexpected weight change.  HENT: Negative for hearing loss and neck pain.  Eyes: Negative for visual disturbance.  Respiratory: Positive for wheezing (mild). Negative for cough, chest tightness and shortness of breath.   Cardiovascular: Negative for chest pain, palpitations and leg swelling.  Gastrointestinal: Negative for nausea, vomiting, abdominal pain, diarrhea, constipation, blood in stool and abdominal distention.  Genitourinary: Negative for hematuria and difficulty urinating.  Musculoskeletal: Negative for myalgias and arthralgias.  Skin: Negative for rash.  Neurological: Negative for dizziness, seizures, syncope and headaches.  Hematological: Does not bruise/bleed easily.  Psychiatric/Behavioral: Negative for dysphoric mood. The patient is  not nervous/anxious.        Objective:   Physical Exam  Nursing note and vitals reviewed. Constitutional: He is oriented to person, place, and time. He appears well-developed and well-nourished. No distress.  HENT:  Head: Normocephalic and atraumatic.  Right Ear: Hearing, tympanic membrane, external ear and ear canal normal.  Left Ear: Hearing, tympanic membrane, external ear and ear canal normal.  Nose: Mucosal edema present. No rhinorrhea. Right sinus exhibits no maxillary sinus tenderness and no frontal sinus tenderness. Left sinus exhibits no maxillary sinus tenderness and no frontal sinus tenderness.  Mouth/Throat: Uvula is midline and mucous membranes are normal. Posterior oropharyngeal erythema present. No oropharyngeal exudate, posterior oropharyngeal edema or tonsillar abscesses.  Evidently congested  Eyes: Conjunctivae and EOM are normal. Pupils are equal, round, and reactive to light. No scleral icterus.  Neck: Normal range of motion. Neck supple. No thyromegaly present.  Cardiovascular: Normal rate, regular rhythm, normal heart sounds and intact distal pulses.   No murmur heard. Pulses:      Radial pulses are 2+ on the right side, and 2+ on the left side.  Pulmonary/Chest: Effort normal and breath sounds normal. No respiratory distress. He has no wheezes. He has no rales.  Abdominal: Soft. Bowel sounds are normal. He exhibits no distension and no mass. There is no tenderness. There is no rebound and no guarding.  Genitourinary: Rectum normal and prostate normal. Rectal exam shows no external hemorrhoid, no internal hemorrhoid, no fissure, no mass, no tenderness and anal tone normal. Prostate is not enlarged (15-20gm) and not tender.  Musculoskeletal: Normal range of motion. He exhibits no edema.  Lymphadenopathy:    He has no cervical adenopathy.  Neurological: He is alert and oriented to person, place, and time.  CN grossly intact, station and gait intact  Skin: Skin is warm  and dry. No rash noted.  Psychiatric: He has a normal mood and affect. His behavior is normal. Judgment and thought content normal.       Assessment & Plan:

## 2013-02-20 NOTE — Assessment & Plan Note (Signed)
New dx Discussed limiting white starches and simple carbs and added sugars.  Change to complex carbs. Likely recent weight gain from quitting smoking contributing.

## 2013-02-20 NOTE — Assessment & Plan Note (Signed)
Reviewed #s.  Strong fmhx CAD.  Overall well controlled off meds.

## 2013-02-20 NOTE — Patient Instructions (Addendum)
Good to see you today, call us with questions. Restart nasal saline and simple mucinex or immediate release guaifenesin. Likely viral sinusitis.  Should get better on its own in 7-10 days. May continue albuterol as needed for cough or shortness of breath.

## 2013-02-20 NOTE — Assessment & Plan Note (Signed)
Encouraged continued abstinence. 

## 2013-02-20 NOTE — Assessment & Plan Note (Signed)
Given short duration, anticipate viral - discussed with patient. rec start guaifenesin, fluids, continue albuterol prn. No wheezing on exam today.

## 2013-02-20 NOTE — Assessment & Plan Note (Signed)
Preventative protocols reviewed and updated unless pt declined. Discussed healthy diet and lifestyle. Reviewed blood work

## 2013-02-21 ENCOUNTER — Encounter: Payer: 59 | Admitting: Family Medicine

## 2013-03-03 ENCOUNTER — Ambulatory Visit: Payer: PRIVATE HEALTH INSURANCE | Admitting: Family Medicine

## 2013-05-28 ENCOUNTER — Ambulatory Visit (INDEPENDENT_AMBULATORY_CARE_PROVIDER_SITE_OTHER): Payer: PRIVATE HEALTH INSURANCE | Admitting: Family Medicine

## 2013-05-28 ENCOUNTER — Encounter: Payer: Self-pay | Admitting: Family Medicine

## 2013-05-28 VITALS — BP 110/76 | HR 84 | Temp 98.9°F | Wt 179.8 lb

## 2013-05-28 DIAGNOSIS — J019 Acute sinusitis, unspecified: Secondary | ICD-10-CM

## 2013-05-28 DIAGNOSIS — R05 Cough: Secondary | ICD-10-CM

## 2013-05-28 DIAGNOSIS — R059 Cough, unspecified: Secondary | ICD-10-CM

## 2013-05-28 MED ORDER — HYDROCODONE-HOMATROPINE 5-1.5 MG/5ML PO SYRP: ORAL_SOLUTION | ORAL | Status: DC

## 2013-05-28 MED ORDER — LEVOFLOXACIN 500 MG PO TABS: 500.0000 mg | ORAL_TABLET | Freq: Every day | ORAL | Status: DC

## 2013-05-28 NOTE — Progress Notes (Signed)
Nature conservation officer at [facility] [address] [place] [address] Phone: [phone] Fax: [phone]  Date:  05/28/2013   Name:  Derrick Horn   DOB:  February 14, 1961   MRN:  [id number] Gender: male Age: 52 y.o.  Primary Physician:  Eustaquio Boyden, MD  Evaluating MD: Hannah Beat, MD   Chief Complaint: Cough   History of Present Illness:  Derrick Horn is a 52 y.o. pleasant patient who presents with the following:  > 1 week of symptoms Sinus infection. Felt bad and went to UC on Monday. Took Amox 875 mg. Some cough meds.   Monday took some mucinex. This morning took some claritin. Also took some Sudafed.  None worked all that well. Feeling very bad, bad facial pain and pressure.     Patient Active Problem List   Diagnosis Date Noted  . Hyperglycemia 02/20/2013  . Healthcare maintenance 07/14/2011  . Dyslipidemia 07/14/2011  . LOW BACK PAIN, CHRONIC 08/25/2010  . Overweight 08/04/2010  . Ex-smoker 08/04/2010  . GERD 08/04/2010  . CHEST PAIN, HX OF 10/19/2007    Past Medical History  Diagnosis Date  . Ex-smoker     quit 12/2012  . History of chest pain 2010    stress test WNL  . GERD (gastroesophageal reflux disease)     mild  . Chronic LBP   . Recurrent sinus infections   . HLD (hyperlipidemia)     mild  . COPD (chronic obstructive pulmonary disease)     minimal emphysema on CT scan    Past Surgical History  Procedure Laterality Date  . Carpal tunnel release      right  . Ett  2010    WNL (Dr. Eden Emms)  . Chest ct  2011    no pulm nodules, mild apical scarring/emphysematous changes  . Colonoscopy  09/2010    2 hyperplastic polyps Leone Payor)    History   Social History  . Marital Status: Married    Spouse Name: N/A    Number of Children: 1  . Years of Education: N/A   Occupational History  . Sales    Social History Main Topics  . Smoking status: Former Smoker    Types: Cigarettes    Quit date: 12/11/2012  . Smokeless tobacco:  Never Used  . Alcohol Use: No  . Drug Use: No  . Sexually Active: Not on file   Other Topics Concern  . 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   1 dog   Activity: shooting, yardwork, no regular exercise   Diet: more salads, more oatmeal, good water    Family History  Problem Relation Age of Onset  . Coronary artery disease Maternal Grandfather 35  . Stroke Maternal Grandfather   . Coronary artery disease Father 23    s/p stents, CABG  . Cancer Maternal Grandmother     uterine  . Diabetes Neg Hx     Allergies  Allergen Reactions  . Azithromycin     REACTION: bradycardia, hypotension,syncope, 3 days in hospital    Medication list has been reviewed and updated.  Outpatient Prescriptions Prior to Visit  Medication Sig Dispense Refill  . albuterol (PROVENTIL HFA;VENTOLIN HFA) 108 (90 BASE) MCG/ACT inhaler Inhale 2 puffs into the lungs every 6 (six) hours as needed for wheezing.  1 Inhaler  0  . aspirin  81 MG tablet Take 81 mg by mouth daily.      . Multiple Vitamin (MULTIVITAMIN PO) Take 1 tablet by mouth daily.        . Omega-3 Fatty Acids (FISH OIL PO) Take 1 capsule by mouth daily.       No facility-administered medications prior to visit.    Review of Systems:  ROS: GEN: Acute illness details above GI: Tolerating PO intake GU: maintaining adequate hydration and urination Pulm: No SOB Interactive and getting along well at home.  Otherwise, ROS is as per the HPI.   Physical Examination: BP 110/76  Pulse 84  Temp(Src) 98.9 F (37.2 C) (Oral)  Wt 179 lb 12 oz (81.534 kg)  BMI 25.79 kg/m2  SpO2 96%  Ideal Body Weight:     Gen: WDWN, NAD; alert,appropriate and cooperative throughout exam  HEENT: Normocephalic and atraumatic. Throat clear, w/o exudate, no LAD, R TM clear, L TM - good landmarks, No fluid present. rhinnorhea.    Left frontal and maxillary sinuses: Tender, frontal Right frontal and maxillary sinuses: Tender, frontal  Neck: No ant or post LAD CV: RRR, No M/G/R Pulm: Breathing comfortably in no resp distress. no w/c/r Abd: S,NT,ND,+BS Extr: no c/c/e Psych: full affect, pleasant   Assessment and Plan:  Acute sinusitis  Cough  Not doing well, change to lvq Hycodan for cough  Orders Today:  No orders of the defined types were placed in this encounter.    Updated Medication List: (Includes new medications, updates to list, dose adjustments) Meds ordered this encounter  Medications  . DISCONTD: amoxicillin (AMOXIL) 875 MG tablet    Sig: Take 875 mg by mouth 2 (two) times daily.  Marland Kitchen dextromethorphan-guaiFENesin (MUCINEX DM) 30-600 MG per 12 hr tablet    Sig: Take 1 tablet by mouth as needed.  . loratadine (CLARITIN) 10 MG tablet    Sig: Take 10 mg by mouth daily.  Marland Kitchen HYDROcodone-homatropine (HYCODAN) 5-1.5 MG/5ML syrup    Sig: 1 tsp po at night before bed prn cough    Dispense:  180 mL    Refill:  0  . levofloxacin (LEVAQUIN) 500 MG tablet    Sig: Take 1 tablet (500 mg total) by mouth daily.    Dispense:  10 tablet    Refill:  0    Medications Discontinued: Medications Discontinued During This Encounter  Medication Reason  . amoxicillin (AMOXIL) 875 MG tablet       Signed, Zylah Elsbernd T. Cosby Proby, MD 05/28/2013 2:56 PM

## 2013-10-16 ENCOUNTER — Ambulatory Visit: Payer: PRIVATE HEALTH INSURANCE | Admitting: Family Medicine

## 2013-10-16 DIAGNOSIS — Z0289 Encounter for other administrative examinations: Secondary | ICD-10-CM

## 2013-12-11 HISTORY — PX: BICEPS TENDON REPAIR: SHX566

## 2014-01-18 ENCOUNTER — Encounter (HOSPITAL_COMMUNITY): Payer: Self-pay | Admitting: Emergency Medicine

## 2014-01-18 ENCOUNTER — Emergency Department (HOSPITAL_COMMUNITY)
Admission: EM | Admit: 2014-01-18 | Discharge: 2014-01-18 | Disposition: A | Discharge status: Home / Self Care | Payer: PRIVATE HEALTH INSURANCE | Attending: Emergency Medicine | Admitting: Emergency Medicine

## 2014-01-18 ENCOUNTER — Emergency Department (HOSPITAL_COMMUNITY): Payer: PRIVATE HEALTH INSURANCE

## 2014-01-18 DIAGNOSIS — G8929 Other chronic pain: Secondary | ICD-10-CM | POA: Insufficient documentation

## 2014-01-18 DIAGNOSIS — IMO0002 Reserved for concepts with insufficient information to code with codable children: Secondary | ICD-10-CM | POA: Insufficient documentation

## 2014-01-18 DIAGNOSIS — Z8719 Personal history of other diseases of the digestive system: Secondary | ICD-10-CM | POA: Insufficient documentation

## 2014-01-18 DIAGNOSIS — Z8679 Personal history of other diseases of the circulatory system: Secondary | ICD-10-CM | POA: Insufficient documentation

## 2014-01-18 DIAGNOSIS — M545 Low back pain, unspecified: Secondary | ICD-10-CM | POA: Insufficient documentation

## 2014-01-18 DIAGNOSIS — J449 Chronic obstructive pulmonary disease, unspecified: Secondary | ICD-10-CM | POA: Insufficient documentation

## 2014-01-18 DIAGNOSIS — Z8639 Personal history of other endocrine, nutritional and metabolic disease: Secondary | ICD-10-CM | POA: Insufficient documentation

## 2014-01-18 DIAGNOSIS — S46219A Strain of muscle, fascia and tendon of other parts of biceps, unspecified arm, initial encounter: Secondary | ICD-10-CM

## 2014-01-18 DIAGNOSIS — X500XXA Overexertion from strenuous movement or load, initial encounter: Secondary | ICD-10-CM | POA: Insufficient documentation

## 2014-01-18 DIAGNOSIS — Z862 Personal history of diseases of the blood and blood-forming organs and certain disorders involving the immune mechanism: Secondary | ICD-10-CM | POA: Insufficient documentation

## 2014-01-18 DIAGNOSIS — Y9289 Other specified places as the place of occurrence of the external cause: Secondary | ICD-10-CM | POA: Insufficient documentation

## 2014-01-18 DIAGNOSIS — J4489 Other specified chronic obstructive pulmonary disease: Secondary | ICD-10-CM | POA: Insufficient documentation

## 2014-01-18 DIAGNOSIS — F17200 Nicotine dependence, unspecified, uncomplicated: Secondary | ICD-10-CM | POA: Insufficient documentation

## 2014-01-18 DIAGNOSIS — Z79899 Other long term (current) drug therapy: Secondary | ICD-10-CM | POA: Insufficient documentation

## 2014-01-18 DIAGNOSIS — Y93B1 Activity, exercise machines primarily for muscle strengthening: Secondary | ICD-10-CM | POA: Insufficient documentation

## 2014-01-18 MED ORDER — HYDROCODONE-ACETAMINOPHEN 5-325 MG PO TABS: 1.0000 | ORAL_TABLET | Freq: Four times a day (QID) | ORAL | Status: DC | PRN

## 2014-01-18 MED ORDER — MELOXICAM 15 MG PO TABS: 15.0000 mg | ORAL_TABLET | Freq: Every day | ORAL | Status: DC

## 2014-01-18 NOTE — Discharge Instructions (Signed)
Muscle Tear °A muscle tear is usually caused by over-exertion or stretching. The muscle often takes a while to heal. Muscle tears require 3 to 4 weeks to heal completely. A history of the injury and a physical exam may be performed. Sometimes, the injury is identified with radiographs and an MRI. Treatment for muscle injuries includes: °· Resting and protecting the affected area until pain with motion is gone. °· Putting ice on the injured area. °· Put ice in a bag. °· Place a towel between your skin and the bag. °· Leave the ice on for 15 to 20 minutes, 3 to 4 times a day. °· After two days, you can use heat to relieve spasms. °· Using compression wraps to help control swelling and limit movement. °· Raising (elevate) the injured area above the level of the heart (if possible) for the first 1 to 2 days after the injury. °· Medicine may be prescribed to reduce pain and inflammation. °Avoid strenuous activities that bring on muscle pain. Exercises to strengthen and stretch the injured muscle can help heal the muscle and prevent repeated injury. After the pain and swelling are gone, you may begin gradual strengthening exercises. Begin range-of-motion exercises and gentle stretching after 3 to 4 days of rest.  °SEEK MEDICAL CARE IF:  °Your injured muscle is not improving after 1 week of treatment. °Document Released: 01/04/2005 Document Revised: 02/19/2012 Document Reviewed: 06/11/2009 °ExitCare® Patient Information ©2014 [employer]. ° °

## 2014-01-18 NOTE — ED Notes (Signed)
PT ambulated with baseline gait; VSS; A&Ox3; no signs of distress; respirations even and unlabored; skin warm and dry; no questions upon discharge.  

## 2014-01-18 NOTE — ED Provider Notes (Signed)
CSN: [id number]     Arrival date & time 01/18/14  1944 History  This chart was scribed for Elmer Sow, MD by Maree Erie, ED Scribe. The patient was seen in room TR07C/TR07C. Patient's care was started at 9:06 PM.    Chief Complaint  Patient presents with  . Arm Pain    Patient is a 53 y.o. male presenting with arm pain. The history is provided by the patient. No language interpreter was used.  Arm Pain    HPI Comments: Derrick Horn is a 53 y.o. male who presents to the Emergency Department complaining of a left upper arm injury. He states that he was holding the back of a treadmill trying to move it when the entire apparatus started to fall and yanked both of his arms forward. He states that he felt a "pop" in the left upper arm when the injury occurred. He has moderate, constant pain to the affected area. He reports decreased strength in the arm secondary to pain.    Past Medical History  Diagnosis Date  . History of chest pain 2010    stress test WNL  . GERD (gastroesophageal reflux disease)     mild  . Chronic LBP   . Recurrent sinus infections   . HLD (hyperlipidemia)     mild  . COPD (chronic obstructive pulmonary disease)     minimal emphysema on CT scan   Past Surgical History  Procedure Laterality Date  . Carpal tunnel release      right  . Ett  2010    WNL (Dr. Johnsie Cancel)  . Chest ct  2011    no pulm nodules, mild apical scarring/emphysematous changes  . Colonoscopy  09/2010    2 hyperplastic polyps [name])   Family History  Problem Relation Age of Onset  . Coronary artery disease Maternal Grandfather 35  . Stroke Maternal Grandfather   . Coronary artery disease Father 22    s/p stents, CABG  . Cancer Maternal Grandmother     uterine  . Diabetes Neg Hx    History  Substance Use Topics  . Smoking status: Current Every Day Smoker    Types: Cigarettes  . Smokeless tobacco: Never Used  . Alcohol Use: No    Review of Systems  Constitutional:  Negative for fever and chills.  Musculoskeletal: Positive for myalgias (left arm).    Allergies  Azithromycin  Home Medications   Current Outpatient Rx  Name  Route  Sig  Dispense  Refill  . albuterol (PROVENTIL HFA;VENTOLIN HFA) 108 (90 BASE) MCG/ACT inhaler   Inhalation   Inhale 2 puffs into the lungs every 6 (six) hours as needed for wheezing.   1 Inhaler   0   . Ibuprofen (ADVIL PO)   Oral   Take 1 tablet by mouth daily as needed (pain).         . Multiple Vitamin (MULTIVITAMIN PO)   Oral   Take 1 tablet by mouth daily.            Triage Vitals: BP 145/89  Pulse 63  Temp(Src) 97.9 F (36.6 C) (Oral)  Resp 16  Wt 186 lb (84.369 kg)  SpO2 96%  Physical Exam  Nursing note and vitals reviewed. Constitutional: He is oriented to person, place, and time. He appears well-developed and well-nourished. No distress.  HENT:  Head: Normocephalic and atraumatic.  Eyes: EOM are normal.  Neck: Neck supple. No tracheal deviation present.  Cardiovascular: Normal rate.   Pulmonary/Chest: Effort  normal. No respiratory distress.  Musculoskeletal: Normal range of motion.  TTP distal belly of the left biceps. Full strength with flexion and supination and pronation of wrist. Mild swelling present at distal bicep.  Neurological: He is alert and oriented to person, place, and time.  Skin: Skin is warm and dry.  Psychiatric: He has a normal mood and affect. His behavior is normal.    ED Course  Procedures (including critical care time)  DIAGNOSTIC STUDIES: Oxygen Saturation is 96% on room air, adequate by my interpretation.    COORDINATION OF CARE: 9:09 PM -Reviewed radiology results with patient. Consulted with Dr. Curly Rim who also evaluated patient. Patient verbalizes understanding and agrees with treatment plan.  Imaging Review   Dg Humerus Left  01/18/2014   CLINICAL DATA:  Elbow injury.  Pain distal anterior humerus.  EXAM: LEFT HUMERUS - 2+ VIEW  COMPARISON:  None.   FINDINGS: No fracture or acute osseous abnormality. In the inner cubital fossa, there does appear to be soft tissue edema raising the possibility of the biceps tendon injury base non mechanism. Consider nonemergent follow-up MRI of the elbow for further assessment.  IMPRESSION: No osseous injury. Soft tissue swelling in the antecubital fossa which could be associated with distal biceps tendon injury.   Electronically Signed   By: Dereck Ligas M.D.   On: 01/18/2014 21:06    EKG Interpretation   None       MDM   1. Biceps muscle strain    Patient with strain of the biceps muscle belly. Full strength and ROM. XRay without acute abnormality. RICE protocol. F/u with pcp. Patient seen in shared visit with Dr. Curly Rim  I personally performed the services described in this documentation, which was scribed in my presence. The recorded information has been reviewed and is accurate.      Margarita Mail, PA-C 01/19/14 2014

## 2014-01-18 NOTE — ED Notes (Signed)
Pt in xray

## 2014-01-18 NOTE — ED Notes (Addendum)
C/o L upper arm pain. Pt states he was moving a treadmill 1 hour ago and it fell on his L upper arm. States he now has decreased strength in L arm.

## 2014-01-18 NOTE — ED Notes (Signed)
MD at bedside. 

## 2014-01-19 ENCOUNTER — Ambulatory Visit (INDEPENDENT_AMBULATORY_CARE_PROVIDER_SITE_OTHER): Payer: PRIVATE HEALTH INSURANCE | Admitting: Family Medicine

## 2014-01-19 ENCOUNTER — Encounter: Payer: Self-pay | Admitting: Family Medicine

## 2014-01-19 ENCOUNTER — Telehealth: Payer: Self-pay | Admitting: Family Medicine

## 2014-01-19 VITALS — BP 128/80 | HR 55 | Temp 98.0°F | Ht 70.0 in | Wt 186.5 lb

## 2014-01-19 DIAGNOSIS — S4980XA Other specified injuries of shoulder and upper arm, unspecified arm, initial encounter: Secondary | ICD-10-CM

## 2014-01-19 DIAGNOSIS — S46212A Strain of muscle, fascia and tendon of other parts of biceps, left arm, initial encounter: Secondary | ICD-10-CM | POA: Insufficient documentation

## 2014-01-19 DIAGNOSIS — S4992XA Unspecified injury of left shoulder and upper arm, initial encounter: Secondary | ICD-10-CM

## 2014-01-19 DIAGNOSIS — S46909A Unspecified injury of unspecified muscle, fascia and tendon at shoulder and upper arm level, unspecified arm, initial encounter: Secondary | ICD-10-CM

## 2014-01-19 HISTORY — DX: Strain of muscle, fascia and tendon of other parts of biceps, left arm, initial encounter: S46.212A

## 2014-01-19 NOTE — Assessment & Plan Note (Signed)
Anticipate traumatic strain of medial biceps belly, no evidence of full thickness biceps tendon tear. Discussed ROM exercises and limiting sling to next 1-2 days. Ice medial upper arm and use meloxicam for pain, with hydrocodone for breakthrough pain. rtc 2-3 wks for f/u or as needed. Pt /wife agree with plan.

## 2014-01-19 NOTE — Patient Instructions (Signed)
I do think you had a bicep tendon injury - basic range of motion exercises for now, may use meloxicam and ice. Out of sling in next few days. Return to see me in 2-3 weeks for follow up, sooner if worsening or concerns.

## 2014-01-19 NOTE — Progress Notes (Signed)
   BP 128/80  Pulse 55  Temp(Src) 98 F (36.7 C) (Oral)  Ht 5\' 10"  (1.778 m)  Wt 186 lb 8 oz (84.596 kg)  BMI 26.76 kg/m2  SpO2 97%   CC: L biceps injury  Subjective:    Patient ID: Derrick Horn, male    DOB: [birth year], 53 y.o.   MRN: [id number]  HPI: Derrick Horn is a 53 y.o. male presenting on 01/19/2014 with ?torn bicep muscle  DOI: 8:30pm 01/18/2014. While moving treadmill yesterday, treadmill weight fell on his arms with elbows flexed, and felt "pop/tear" sensation and sudden pain. Evaluated at ER yesterday - ER note reviewed.  Taking hydrocodone for pain.    CLINICAL DATA: Elbow injury. Pain distal anterior humerus.  EXAM:  LEFT HUMERUS - 2+ VIEW  COMPARISON: None.  FINDINGS:  No fracture or acute osseous abnormality. In the inner cubital  fossa, there does appear to be soft tissue edema raising the  possibility of the biceps tendon injury base non mechanism. Consider  nonemergent follow-up MRI of the elbow for further assessment.  IMPRESSION:  No osseous injury. Soft tissue swelling in the antecubital fossa  which could be associated with distal biceps tendon injury.  Electronically Signed  By: Dereck Ligas M.D.  On: 01/18/2014 21:06   Relevant past medical, surgical, family and social history reviewed and updated. Allergies and medications reviewed and updated. Current Outpatient Prescriptions on File Prior to Visit  Medication Sig  . HYDROcodone-acetaminophen (NORCO) 5-325 MG per tablet Take 1-2 tablets by mouth every 6 (six) hours as needed for moderate pain.  . meloxicam (MOBIC) 15 MG tablet Take 1 tablet (15 mg total) by mouth daily. Take 1 daily with food.  . Multiple Vitamin (MULTIVITAMIN PO) Take 1 tablet by mouth daily.    Marland Kitchen albuterol (PROVENTIL HFA;VENTOLIN HFA) 108 (90 BASE) MCG/ACT inhaler Inhale 2 puffs into the lungs every 6 (six) hours as needed for wheezing.  . Ibuprofen (ADVIL PO) Take 1 tablet by mouth daily as needed (pain).   No current  facility-administered medications on file prior to visit.    Review of Systems Per HPI unless specifically indicated above    Objective:    BP 128/80  Pulse 55  Temp(Src) 98 F (36.7 C) (Oral)  Ht 5\' 10"  (1.778 m)  Wt 186 lb 8 oz (84.596 kg)  BMI 26.76 kg/m2  SpO2 97%  Physical Exam  Nursing note and vitals reviewed. Constitutional: He appears well-developed and well-nourished. No distress.  Musculoskeletal:  Tender to palpation at distal biceps tendon medial upper arm No obvious full thickness tear appreciated. Pain but strength preserved with elbow flexion and pronation against resistance Tender with yergason's test  Skin:  No bruising        Assessment & Plan:   Problem List Items Addressed This Visit   Left upper arm injury - Primary     Anticipate traumatic strain of medial biceps belly, no evidence of full thickness biceps tendon tear. Discussed ROM exercises and limiting sling to next 1-2 days. Ice medial upper arm and use meloxicam for pain, with hydrocodone for breakthrough pain. rtc 2-3 wks for f/u or as needed. Pt /wife agree with plan.        Follow up plan: Return in about 3 weeks (around 02/09/2014), or if symptoms worsen or fail to improve, for follow up visit.

## 2014-01-19 NOTE — Progress Notes (Signed)
Pre-visit discussion using our clinic review tool. No additional management support is needed unless otherwise documented below in the visit note.  

## 2014-01-19 NOTE — Telephone Encounter (Signed)
Pt was recently seen at [facility] ER for torn bicep muscle. They wanted him to f/u with Dr. Darnell Level. I got him in for Friday at 11:30 am. That was really all he had because the rest were acute's only. Pt is wanting to know if he could get in any sooner or see someone else for the f/u ?? Please advise

## 2014-01-19 NOTE — Telephone Encounter (Signed)
Scheduled for today at 12:45 with Dr. Darnell Level

## 2014-01-19 NOTE — Telephone Encounter (Signed)
We can place him with myself at any 15 min appt (even open slot today at 12:45pm or tomorrow at 10am), or with Dr. Lorelei Pont this week for f/u.

## 2014-01-20 NOTE — ED Provider Notes (Signed)
Medical screening examination/treatment/procedure(s) were conducted as a shared visit with non-physician practitioner(s) and myself.  I personally evaluated the patient during the encounter.  EKG Interpretation   None       Evaluated pt.  FAROM - able to flex, extend, supinate, pronate against resistance.  No defect noted.  No point ttp. Xray negative.  Distal n/v/m function intact.  Sensation intact.  Cap refill brisk.  Lengthy d/w pt regarding the need for f/u and possible need for further imaging (MRI) or treatment (PT and/or ortho).  I suspect this is a strain/contusion and not a tear, avulsion, fracture.    Elmer Sow, MD 01/20/14 [phone]

## 2014-01-23 ENCOUNTER — Encounter: Payer: PRIVATE HEALTH INSURANCE | Admitting: Family Medicine

## 2014-02-10 ENCOUNTER — Ambulatory Visit: Payer: Self-pay | Admitting: Podiatry

## 2014-02-20 ENCOUNTER — Ambulatory Visit (INDEPENDENT_AMBULATORY_CARE_PROVIDER_SITE_OTHER): Payer: No Typology Code available for payment source | Admitting: Podiatry

## 2014-02-20 ENCOUNTER — Encounter: Payer: Self-pay | Admitting: Podiatry

## 2014-02-20 VITALS — BP 118/62 | HR 72 | Resp 16 | Ht 70.0 in | Wt 180.0 lb

## 2014-02-20 DIAGNOSIS — L259 Unspecified contact dermatitis, unspecified cause: Secondary | ICD-10-CM

## 2014-02-20 DIAGNOSIS — M779 Enthesopathy, unspecified: Secondary | ICD-10-CM

## 2014-02-20 MED ORDER — PREDNISONE 10 MG PO KIT: PACK | ORAL | Status: DC

## 2014-02-20 NOTE — Progress Notes (Signed)
Subjective:     Patient ID: Derrick Horn, male   DOB: Apr 08, 1961, 53 y.o.   MRN: [id number]  Foot Pain   patient presents stating I been getting swelling in the bottom of my foot and what appears to be blisters that are on and off and may also itch. I went to urgent care who gave me Benadryl which has not been helpful   Review of Systems  All other systems reviewed and are negative.       Objective:   Physical Exam  Nursing note and vitals reviewed. Constitutional: He is oriented to person, place, and time.  Cardiovascular: Intact distal pulses.   Neurological: He is oriented to person, place, and time.  Skin: Skin is warm.   neurovascular status intact with normal range of motion and muscle strength noted of both feet. Patient is found to have no indications of the systemic disease that he has been describing but residual small nodule plantar aspect right arch     Assessment:     Appears to be some kind of systemic condition which may be inflammatory or fungal and its origin    Plan:     We are going to start him on 12 a Medrol Dosepak to see if this gets rid of symptoms and if it does then we will just keep an eye on it and if it doesn't I want to get blood work on him. He will first get approval from the doctor who is reattached his bicep prior to initiating steroid treatment

## 2014-02-20 NOTE — Progress Notes (Signed)
   Subjective:    Patient ID: Derrick Horn, male    DOB: 1961/02/17, 53 y.o.   MRN: [id number]  HPI Comments: Been having issues on and off the last 6 months the foot start out with a  Itch and then swell and turned red, last time i went to urgent care and they gave me a shot of benadryl and it helped. When it acts up it a burning , swelling last about 24 hours. And it does not stay to one area.   Foot Pain      Review of Systems  All other systems reviewed and are negative.       Objective:   Physical Exam        Assessment & Plan:

## 2014-02-21 ENCOUNTER — Encounter: Payer: Self-pay | Admitting: Family Medicine

## 2015-07-16 ENCOUNTER — Ambulatory Visit: Payer: PRIVATE HEALTH INSURANCE | Admitting: Family Medicine

## 2015-07-29 ENCOUNTER — Encounter: Payer: Self-pay | Admitting: Internal Medicine

## 2015-12-12 DIAGNOSIS — H903 Sensorineural hearing loss, bilateral: Secondary | ICD-10-CM

## 2015-12-12 HISTORY — DX: Sensorineural hearing loss, bilateral: H90.3

## 2015-12-29 ENCOUNTER — Encounter (INDEPENDENT_AMBULATORY_CARE_PROVIDER_SITE_OTHER): Payer: Self-pay

## 2015-12-29 ENCOUNTER — Encounter: Payer: Self-pay | Admitting: Family Medicine

## 2015-12-29 ENCOUNTER — Ambulatory Visit (INDEPENDENT_AMBULATORY_CARE_PROVIDER_SITE_OTHER): Payer: BLUE CROSS/BLUE SHIELD | Admitting: Family Medicine

## 2015-12-29 VITALS — BP 126/84 | HR 60 | Temp 98.0°F | Wt 193.8 lb

## 2015-12-29 DIAGNOSIS — H9193 Unspecified hearing loss, bilateral: Secondary | ICD-10-CM | POA: Insufficient documentation

## 2015-12-29 DIAGNOSIS — R3911 Hesitancy of micturition: Secondary | ICD-10-CM | POA: Diagnosis not present

## 2015-12-29 DIAGNOSIS — E049 Nontoxic goiter, unspecified: Secondary | ICD-10-CM

## 2015-12-29 DIAGNOSIS — H9313 Tinnitus, bilateral: Secondary | ICD-10-CM | POA: Diagnosis not present

## 2015-12-29 DIAGNOSIS — R1310 Dysphagia, unspecified: Secondary | ICD-10-CM | POA: Diagnosis not present

## 2015-12-29 DIAGNOSIS — E010 Iodine-deficiency related diffuse (endemic) goiter: Secondary | ICD-10-CM

## 2015-12-29 MED ORDER — TAMSULOSIN HCL 0.4 MG PO CAPS: 0.4000 mg | ORAL_CAPSULE | Freq: Every day | ORAL | Status: DC

## 2015-12-29 NOTE — Progress Notes (Signed)
Pre visit review using our clinic review tool, if applicable. No additional management support is needed unless otherwise documented below in the visit note. 

## 2015-12-29 NOTE — Assessment & Plan Note (Signed)
Given sxs, refer to audiologist.

## 2015-12-29 NOTE — Assessment & Plan Note (Signed)
Anticipate BPH related. Check UA to r/o infection (pt unable to provide specimen today). Flomax printed for patient - he will research and may trial. Will check DRE/PSA at upcoming CPE.

## 2015-12-29 NOTE — Patient Instructions (Addendum)
Bring first urine of the morning to check on Friday. Printed flomax script provided today.  Pass by our referral coordinator to schedule thyroid ultrasound.  Hearing screen today and we may refer you to audiologist.  Good to see you today, call us with questions.  Return as needed or over next few months for physical.

## 2015-12-29 NOTE — Assessment & Plan Note (Signed)
Possible mild thyromegaly - will check thyroid US.  Check TFTs at f/u visit.

## 2015-12-29 NOTE — Progress Notes (Signed)
BP 126/84 mmHg  Pulse 60  Temp(Src) 98 F (36.7 C) (Oral)  Wt 193 lb 12 oz (87.884 kg)   CC: lump in throat  Subjective:    Patient ID: Derrick Horn, male    DOB: 11-01-1961, 55 y.o.   MRN: [id number]  HPI: Derrick Horn is a 55 y.o. male presenting on 12/29/2015 for Lump in throat and Tinnitus   Lots of stress with family illnesses recently.   Over last 2 months noticing lump in throat just below area of thyroid associated with dysphagia (food and water getting stuck, painful). Discomfort lasts for 30 seconds. Feels this is progressively worsening. Weight gain noted. No nausea, early satiety. + GERD sxs.  Requests referral for R>L tinnitus described as buzzing and decreased hearing. Ongoing for years, worsening recently. Wife notices he speaks more loudly. Raises volume on TV. + difficulty with understanding conversation in noisy room.  Does go shooting but wears ear protection.  No ear pain or discharge. No significant congestion.   3-4 mo h/o noticing first am void hesitancy and cloudy urine. Weak stream. Nocturia x3-4. Rest of day normal voiding. No back pain, dysuria, hematuria.   Relevant past medical, surgical, family and social history reviewed and updated as indicated. Interim medical history since our last visit reviewed. Allergies and medications reviewed and updated. Current Outpatient Prescriptions on File Prior to Visit  Medication Sig  . Multiple Vitamin (MULTIVITAMIN PO) Take 1 tablet by mouth daily.     No current facility-administered medications on file prior to visit.    Review of Systems Per HPI unless specifically indicated in ROS section    Objective:    BP 126/84 mmHg  Pulse 60  Temp(Src) 98 F (36.7 C) (Oral)  Wt 193 lb 12 oz (87.884 kg)  Wt Readings from Last 3 Encounters:  12/29/15 193 lb 12 oz (87.884 kg)  02/20/14 180 lb (81.647 kg)  01/19/14 186 lb 8 oz (84.596 kg)    Physical Exam  Constitutional: He appears well-developed and  well-nourished. No distress.  HENT:  Right Ear: Hearing, tympanic membrane, external ear and ear canal normal.  Left Ear: Hearing, tympanic membrane, external ear and ear canal normal.  Neck: Normal range of motion. Neck supple. Thyromegaly (mild) present.  Abdominal: Soft. Normal appearance and bowel sounds are normal. He exhibits no distension and no mass. There is no hepatosplenomegaly. There is no tenderness. There is no rigidity, no rebound, no guarding, no CVA tenderness and negative Murphy's sign.  Musculoskeletal: He exhibits no edema.  Lymphadenopathy:    He has no cervical adenopathy.  Skin: Skin is warm and dry. No rash noted.  Nursing note and vitals reviewed.     Assessment & Plan:   Problem List Items Addressed This Visit    Urinary hesitancy - Primary    Anticipate BPH related. Check UA to r/o infection (pt unable to provide specimen today). Flomax printed for patient - he will research and may trial. Will check DRE/PSA at upcoming CPE.       Dysphagia    Possible mild thyromegaly - will check thyroid US.  Check TFTs at f/u visit.      Relevant Orders   US Soft Tissue Head/Neck   Decreased hearing of both ears    Given sxs, refer to audiologist.       Relevant Orders   Ambulatory referral to Audiology    Other Visit Diagnoses    Tinnitus, bilateral        Relevant  Orders    Ambulatory referral to Audiology    Thyromegaly        Relevant Orders    US Soft Tissue Head/Neck        Follow up plan: Return for annual exam, prior fasting for blood work.

## 2015-12-30 LAB — POC URINALSYSI DIPSTICK (AUTOMATED)
Bilirubin, UA: NEGATIVE
Blood, UA: NEGATIVE
Glucose, UA: NEGATIVE
Ketones, UA: NEGATIVE
Leukocytes, UA: NEGATIVE
Nitrite, UA: NEGATIVE
Protein, UA: NEGATIVE
Spec Grav, UA: >=1.03
Urobilinogen, UA: 0.2
pH, UA: 6

## 2015-12-30 NOTE — Addendum Note (Signed)
Addended by: Royann Shivers A on: 12/30/2015 11:37 AM   Modules accepted: Orders

## 2016-01-07 ENCOUNTER — Ambulatory Visit (HOSPITAL_COMMUNITY)
Admission: RE | Admit: 2016-01-07 | Discharge: 2016-01-07 | Disposition: A | Discharge status: Home / Self Care | Payer: BLUE CROSS/BLUE SHIELD | Source: Ambulatory Visit | Attending: Family Medicine | Admitting: Family Medicine

## 2016-01-07 DIAGNOSIS — R1310 Dysphagia, unspecified: Secondary | ICD-10-CM | POA: Insufficient documentation

## 2016-01-07 DIAGNOSIS — E010 Iodine-deficiency related diffuse (endemic) goiter: Secondary | ICD-10-CM

## 2016-01-07 DIAGNOSIS — E049 Nontoxic goiter, unspecified: Secondary | ICD-10-CM | POA: Insufficient documentation

## 2016-01-08 ENCOUNTER — Other Ambulatory Visit: Payer: Self-pay | Admitting: Family Medicine

## 2016-01-08 MED ORDER — OMEPRAZOLE 20 MG PO CPDR: 20.0000 mg | DELAYED_RELEASE_CAPSULE | Freq: Every day | ORAL | Status: DC

## 2016-01-11 ENCOUNTER — Encounter: Payer: Self-pay | Admitting: Family Medicine

## 2016-03-08 ENCOUNTER — Other Ambulatory Visit: Payer: Self-pay | Admitting: Family Medicine

## 2016-03-08 DIAGNOSIS — Z125 Encounter for screening for malignant neoplasm of prostate: Secondary | ICD-10-CM

## 2016-03-08 DIAGNOSIS — E785 Hyperlipidemia, unspecified: Secondary | ICD-10-CM

## 2016-03-08 DIAGNOSIS — Z1159 Encounter for screening for other viral diseases: Secondary | ICD-10-CM

## 2016-03-09 ENCOUNTER — Other Ambulatory Visit: Payer: Self-pay | Admitting: Family Medicine

## 2016-03-09 ENCOUNTER — Other Ambulatory Visit (INDEPENDENT_AMBULATORY_CARE_PROVIDER_SITE_OTHER): Payer: BLUE CROSS/BLUE SHIELD

## 2016-03-09 DIAGNOSIS — E785 Hyperlipidemia, unspecified: Secondary | ICD-10-CM

## 2016-03-09 DIAGNOSIS — Z125 Encounter for screening for malignant neoplasm of prostate: Secondary | ICD-10-CM

## 2016-03-09 DIAGNOSIS — Z1159 Encounter for screening for other viral diseases: Secondary | ICD-10-CM

## 2016-03-09 LAB — BASIC METABOLIC PANEL
BUN: 14 mg/dL (ref 6–23)
CO2: 26 mEq/L (ref 19–32)
Calcium: 9.6 mg/dL (ref 8.4–10.5)
Chloride: 105 mEq/L (ref 96–112)
Creatinine, Ser: 1.05 mg/dL (ref 0.40–1.50)
GFR: 77.73 mL/min (ref >60.00)
Glucose, Bld: 116 mg/dL — ABNORMAL HIGH (ref 70–99)
Potassium: 4 mEq/L (ref 3.5–5.1)
Sodium: 138 mEq/L (ref 135–145)

## 2016-03-09 LAB — LIPID PANEL
Cholesterol: 186 mg/dL (ref 0–200)
HDL: 45 mg/dL (ref >39.00)
LDL Cholesterol: 123 mg/dL — ABNORMAL HIGH (ref 0–99)
NonHDL: 141.07
Total CHOL/HDL Ratio: 4
Triglycerides: 92 mg/dL (ref 0.0–149.0)
VLDL: 18.4 mg/dL (ref 0.0–40.0)

## 2016-03-09 LAB — PSA: PSA: 5.61 ng/mL — ABNORMAL HIGH (ref 0.10–4.00)

## 2016-03-10 LAB — HEPATITIS C ANTIBODY: HCV Ab: NEGATIVE

## 2016-03-13 ENCOUNTER — Encounter: Payer: PRIVATE HEALTH INSURANCE | Admitting: Family Medicine

## 2016-03-31 ENCOUNTER — Encounter: Payer: Self-pay | Admitting: Family Medicine

## 2016-03-31 ENCOUNTER — Ambulatory Visit (INDEPENDENT_AMBULATORY_CARE_PROVIDER_SITE_OTHER): Payer: BLUE CROSS/BLUE SHIELD | Admitting: Family Medicine

## 2016-03-31 VITALS — BP 112/72 | HR 44 | Temp 97.4°F | Ht 68.75 in | Wt 183.0 lb

## 2016-03-31 DIAGNOSIS — Z87891 Personal history of nicotine dependence: Secondary | ICD-10-CM

## 2016-03-31 DIAGNOSIS — R739 Hyperglycemia, unspecified: Secondary | ICD-10-CM

## 2016-03-31 DIAGNOSIS — R972 Elevated prostate specific antigen [PSA]: Secondary | ICD-10-CM

## 2016-03-31 DIAGNOSIS — N400 Enlarged prostate without lower urinary tract symptoms: Secondary | ICD-10-CM | POA: Insufficient documentation

## 2016-03-31 DIAGNOSIS — Z0000 Encounter for general adult medical examination without abnormal findings: Secondary | ICD-10-CM

## 2016-03-31 DIAGNOSIS — E663 Overweight: Secondary | ICD-10-CM | POA: Diagnosis not present

## 2016-03-31 DIAGNOSIS — E785 Hyperlipidemia, unspecified: Secondary | ICD-10-CM

## 2016-03-31 LAB — HEMOGLOBIN A1C: Hgb A1c MFr Bld: 5.5 % (ref 4.6–6.5)

## 2016-03-31 NOTE — Assessment & Plan Note (Signed)
Discussed avoiding added sugars in diet. 

## 2016-03-31 NOTE — Assessment & Plan Note (Signed)
Preventative protocols reviewed and updated unless pt declined. Discussed healthy diet and lifestyle.  

## 2016-03-31 NOTE — Addendum Note (Signed)
Addended by: Marchia Bond on: 03/31/2016 09:41 AM   Modules accepted: Miquel Dunn

## 2016-03-31 NOTE — Progress Notes (Signed)
Pre visit review using our clinic review tool, if applicable. No additional management support is needed unless otherwise documented below in the visit note. 

## 2016-03-31 NOTE — Patient Instructions (Signed)
Recheck prostate levels today. Prostate feeling ok today ?benign enlargement of the prostate. You are doing well today , congratulations on weight loss. Keep up healthy changes. Return as needed or in 1 year for next physical.  Health Maintenance, Male A healthy lifestyle and preventative care can promote health and wellness.  Maintain regular health, dental, and eye exams.  Eat a healthy diet. Foods like vegetables, fruits, whole grains, low-fat dairy products, and lean protein foods contain the nutrients you need and are low in calories. Decrease your intake of foods high in solid fats, added sugars, and salt. Get information about a proper diet from your health care provider, if necessary.  Regular physical exercise is one of the most important things you can do for your health. Most adults should get at least 150 minutes of moderate-intensity exercise (any activity that increases your heart rate and causes you to sweat) each week. In addition, most adults need muscle-strengthening exercises on 2 or more days a week.   Maintain a healthy weight. The body mass index (BMI) is a screening tool to identify possible weight problems. It provides an estimate of body fat based on height and weight. Your health care provider can find your BMI and can help you achieve or maintain a healthy weight. For males 20 years and older:  A BMI below 18.5 is considered underweight.  A BMI of 18.5 to 24.9 is normal.  A BMI of 25 to 29.9 is considered overweight.  A BMI of 30 and above is considered obese.  Maintain normal blood lipids and cholesterol by exercising and minimizing your intake of saturated fat. Eat a balanced diet with plenty of fruits and vegetables. Blood tests for lipids and cholesterol should begin at age 59 and be repeated every 5 years. If your lipid or cholesterol levels are high, you are over age 72, or you are at high risk for heart disease, you may need your cholesterol levels checked  more frequently.Ongoing high lipid and cholesterol levels should be treated with medicines if diet and exercise are not working.  If you smoke, find out from your health care provider how to quit. If you do not use tobacco, do not start.  Lung cancer screening is recommended for adults aged 63-80 years who are at high risk for developing lung cancer because of a history of smoking. A yearly low-dose CT scan of the lungs is recommended for people who have at least a 30-pack-year history of smoking and are current smokers or have quit within the past 15 years. A pack year of smoking is smoking an average of 1 pack of cigarettes a day for 1 year (for example, a 30-pack-year history of smoking could mean smoking 1 pack a day for 30 years or 2 packs a day for 15 years). Yearly screening should continue until the smoker has stopped smoking for at least 15 years. Yearly screening should be stopped for people who develop a health problem that would prevent them from having lung cancer treatment.  If you choose to drink alcohol, do not have more than 2 drinks per day. One drink is considered to be 12 oz (360 mL) of beer, 5 oz (150 mL) of wine, or 1.5 oz (45 mL) of liquor.  Avoid the use of street drugs. Do not share needles with anyone. Ask for help if you need support or instructions about stopping the use of drugs.  High blood pressure causes heart disease and increases the risk of stroke. High  blood pressure is more likely to develop in:  People who have blood pressure in the end of the normal range (100-139/85-89 mm Hg).  People who are overweight or obese.  People who are African American.  If you are 40-25 years of age, have your blood pressure checked every 3-5 years. If you are 36 years of age or older, have your blood pressure checked every year. You should have your blood pressure measured twice--once when you are at a hospital or clinic, and once when you are not at a hospital or clinic. Record  the average of the two measurements. To check your blood pressure when you are not at a hospital or clinic, you can use:  An automated blood pressure machine at a pharmacy.  A home blood pressure monitor.  If you are 63-50 years old, ask your health care provider if you should take aspirin to prevent heart disease.  Diabetes screening involves taking a blood sample to check your fasting blood sugar level. This should be done once every 3 years after age 45 if you are at a normal weight and without risk factors for diabetes. Testing should be considered at a younger age or be carried out more frequently if you are overweight and have at least 1 risk factor for diabetes.  Colorectal cancer can be detected and often prevented. Most routine colorectal cancer screening begins at the age of 53 and continues through age 40. However, your health care provider may recommend screening at an earlier age if you have risk factors for colon cancer. On a yearly basis, your health care provider may provide home test kits to check for hidden blood in the stool. A small camera at the end of a tube may be used to directly examine the colon (sigmoidoscopy or colonoscopy) to detect the earliest forms of colorectal cancer. Talk to your health care provider about this at age 58 when routine screening begins. A direct exam of the colon should be repeated every 5-10 years through age 50, unless early forms of precancerous polyps or small growths are found.  People who are at an increased risk for hepatitis B should be screened for this virus. You are considered at high risk for hepatitis B if:  You were born in a country where hepatitis B occurs often. Talk with your health care provider about which countries are considered high risk.  Your parents were born in a high-risk country and you have not received a shot to protect against hepatitis B (hepatitis B vaccine).  You have HIV or AIDS.  You use needles to inject  street drugs.  You live with, or have sex with, someone who has hepatitis B.  You are a man who has sex with other men (MSM).  You get hemodialysis treatment.  You take certain medicines for conditions like cancer, organ transplantation, and autoimmune conditions.  Hepatitis C blood testing is recommended for all people born from [birth year] through 1966 and any individual with known risk factors for hepatitis C.  Healthy men should no longer receive prostate-specific antigen (PSA) blood tests as part of routine cancer screening. Talk to your health care provider about prostate cancer screening.  Testicular cancer screening is not recommended for adolescents or adult males who have no symptoms. Screening includes self-exam, a health care provider exam, and other screening tests. Consult with your health care provider about any symptoms you have or any concerns you have about testicular cancer.  Practice safe sex. Use condoms and  avoid high-risk sexual practices to reduce the spread of sexually transmitted infections (STIs).  You should be screened for STIs, including gonorrhea and chlamydia if:  You are sexually active and are younger than 24 years.  You are older than 24 years, and your health care provider tells you that you are at risk for this type of infection.  Your sexual activity has changed since you were last screened, and you are at an increased risk for chlamydia or gonorrhea. Ask your health care provider if you are at risk.  If you are at risk of being infected with HIV, it is recommended that you take a prescription medicine daily to prevent HIV infection. This is called pre-exposure prophylaxis (PrEP). You are considered at risk if:  You are a man who has sex with other men (MSM).  You are a heterosexual man who is sexually active with multiple partners.  You take drugs by injection.  You are sexually active with a partner who has HIV.  Talk with your health care provider  about whether you are at high risk of being infected with HIV. If you choose to begin PrEP, you should first be tested for HIV. You should then be tested every 3 months for as long as you are taking PrEP.  Use sunscreen. Apply sunscreen liberally and repeatedly throughout the day. You should seek shade when your shadow is shorter than you. Protect yourself by wearing long sleeves, pants, a wide-brimmed hat, and sunglasses year round whenever you are outdoors.  Tell your health care provider of new moles or changes in moles, especially if there is a change in shape or color. Also, tell your health care provider if a mole is larger than the size of a pencil eraser.  A one-time screening for abdominal aortic aneurysm (AAA) and surgical repair of large AAAs by ultrasound is recommended for men aged 46-75 years who are current or former smokers.  Stay current with your vaccines (immunizations).   This information is not intended to replace advice given to you by your health care provider. Make sure you discuss any questions you have with your health care provider.   Document Released: 05/25/2008 Document Revised: 12/18/2014 Document Reviewed: 04/24/2011 Elsevier Interactive Patient Education [employer].

## 2016-03-31 NOTE — Progress Notes (Signed)
BP 112/72 mmHg  Pulse 44  Temp(Src) 97.4 F (36.3 C) (Oral)  Ht 5' 8.75" (1.746 m)  Wt 183 lb (83.008 kg)  BMI 27.23 kg/m2  SpO2 97%   CC: CPE  Subjective:    Patient ID: Derrick Horn, male    DOB: 1961-09-23, 55 y.o.   MRN: [id number]  HPI: Derrick Horn is a 55 y.o. male presenting on 03/31/2016 for Annual Exam   Increased gym activity - more aerobic exercise and weight lifting on machines. Attributes bradycardia to this.  Urinary frequency - did not try flomax. This has resolved. ?BPH related.  Ex-smoker - quit 2015. Prior smoked 1.5 packs per week.   Preventative: COLONOSCOPY Date: 09/2010 2 hyperplastic polyps rpt 10 yrs [name]) Prostate - some weakening of stream in the mornings. Would like to continue screening.  Flu shot - yearly Tdap 2011 Seat belt use discussed Sunscreen use discussed. No changing moles on skin.   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 1 dog Activity: shooting, yardwork, no regular exercise Diet: more salads, more oatmeal, good water  Relevant past medical, surgical, family and social history reviewed and updated as indicated. Interim medical history since our last visit reviewed. Allergies and medications reviewed and updated. Current Outpatient Prescriptions on File Prior to Visit  Medication Sig  . Multiple Vitamin (MULTIVITAMIN PO) Take 1 tablet by mouth daily.     No current facility-administered medications on file prior to visit.    Review of Systems  Constitutional: Negative for fever, chills, activity change, appetite change, fatigue and unexpected weight change.  HENT: Negative for hearing loss.   Eyes: Negative for visual disturbance.  Respiratory: Negative for cough, chest tightness, shortness of breath and wheezing.   Cardiovascular: Negative for chest pain, palpitations and leg swelling.  Gastrointestinal: Negative for nausea, vomiting,  abdominal pain, diarrhea, constipation, blood in stool and abdominal distention.  Genitourinary: Negative for hematuria and difficulty urinating.  Musculoskeletal: Negative for myalgias, arthralgias and neck pain.  Skin: Negative for rash.  Neurological: Negative for dizziness, seizures, syncope and headaches.  Hematological: Negative for adenopathy. Does not bruise/bleed easily.  Psychiatric/Behavioral: Negative for dysphoric mood. The patient is not nervous/anxious.    Per HPI unless specifically indicated in ROS section     Objective:    BP 112/72 mmHg  Pulse 44  Temp(Src) 97.4 F (36.3 C) (Oral)  Ht 5' 8.75" (1.746 m)  Wt 183 lb (83.008 kg)  BMI 27.23 kg/m2  SpO2 97%  Wt Readings from Last 3 Encounters:  03/31/16 183 lb (83.008 kg)  12/29/15 193 lb 12 oz (87.884 kg)  02/20/14 180 lb (81.647 kg)    Physical Exam  Constitutional: He is oriented to person, place, and time. He appears well-developed and well-nourished. No distress.  HENT:  Head: Normocephalic and atraumatic.  Right Ear: Hearing, tympanic membrane, external ear and ear canal normal.  Left Ear: Hearing, tympanic membrane, external ear and ear canal normal.  Nose: Nose normal.  Mouth/Throat: Uvula is midline, oropharynx is clear and moist and mucous membranes are normal. No oropharyngeal exudate, posterior oropharyngeal edema or posterior oropharyngeal erythema.  Eyes: Conjunctivae and EOM are normal. Pupils are equal, round, and reactive to light. No scleral icterus.  Neck: Normal range of motion. Neck supple. No thyromegaly present.  Cardiovascular: Normal rate, regular rhythm, normal heart sounds and intact distal pulses.   No murmur heard. Pulses:      Radial pulses are  2+ on the right side, and 2+ on the left side.  Pulmonary/Chest: Effort normal and breath sounds normal. No respiratory distress. He has no wheezes. He has no rales.  Abdominal: Soft. Bowel sounds are normal. He exhibits no distension and no  mass. There is no tenderness. There is no rebound and no guarding.  Genitourinary: Rectum normal. Rectal exam shows no external hemorrhoid, no internal hemorrhoid, no fissure, no mass, no tenderness and anal tone normal. Prostate is enlarged (~25gm). Prostate is not tender.  Musculoskeletal: Normal range of motion. He exhibits no edema.  Lymphadenopathy:    He has no cervical adenopathy.  Neurological: He is alert and oriented to person, place, and time.  CN grossly intact, station and gait intact  Skin: Skin is warm and dry. No rash noted.  Psychiatric: He has a normal mood and affect. His behavior is normal. Judgment and thought content normal.  Nursing note and vitals reviewed.  Results for orders placed or performed in visit on 03/09/16  Lipid panel  Result Value Ref Range   Cholesterol 186 0 - 200 mg/dL   Triglycerides 92.0 0.0 - 149.0 mg/dL   HDL 45.00 >39.00 mg/dL   VLDL 18.4 0.0 - 40.0 mg/dL   LDL Cholesterol 123 (H) 0 - 99 mg/dL   Total CHOL/HDL Ratio 4    NonHDL [id number]   Basic metabolic panel  Result Value Ref Range   Sodium 138 135 - 145 mEq/L   Potassium 4.0 3.5 - 5.1 mEq/L   Chloride 105 96 - 112 mEq/L   CO2 26 19 - 32 mEq/L   Glucose, Bld 116 (H) 70 - 99 mg/dL   BUN 14 6 - 23 mg/dL   Creatinine, Ser 1.05 0.40 - 1.50 mg/dL   Calcium 9.6 8.4 - 10.5 mg/dL   GFR 77.73 >60.00 mL/min  PSA  Result Value Ref Range   PSA 5.61 (H) 0.10 - 4.00 ng/mL      Assessment & Plan:   Problem List Items Addressed This Visit    Overweight    Goal 10 more lb weight los      Ex-smoker    Encouraged ocntinued abstinence. Sneaks cigarette occasionally.      Healthcare maintenance - Primary    Preventative protocols reviewed and updated unless pt declined. Discussed healthy diet and lifestyle.       Dyslipidemia    Overall improved with weight loss. fmhx CAD, ideal LDL <100.       Hyperglycemia    Discussed avoiding added sugars in diet.      Relevant Orders    Hemoglobin A1c   Elevated PSA    Recheck PSA today - if remaining elevated discussed uro referral. Anticipate BPH related. DRE reassuring today.       Relevant Orders   PSA Total (Reflex To Free)       Follow up plan: Return in about 1 year (around 03/31/2017), or as needed, for annual exam, prior fasting for blood work.  Ria Bush, MD

## 2016-03-31 NOTE — Assessment & Plan Note (Signed)
Goal 10 more lb weight los

## 2016-03-31 NOTE — Assessment & Plan Note (Signed)
Recheck PSA today - if remaining elevated discussed uro referral. Anticipate BPH related. DRE reassuring today.

## 2016-03-31 NOTE — Assessment & Plan Note (Signed)
Overall improved with weight loss. fmhx CAD, ideal LDL <100.

## 2016-03-31 NOTE — Assessment & Plan Note (Signed)
Encouraged ocntinued abstinence. Sneaks cigarette occasionally.

## 2016-04-01 ENCOUNTER — Other Ambulatory Visit: Payer: Self-pay | Admitting: Family Medicine

## 2016-04-01 DIAGNOSIS — R972 Elevated prostate specific antigen [PSA]: Secondary | ICD-10-CM

## 2016-04-01 LAB — PSA TOTAL (REFLEX TO FREE): Prostate Specific Ag, Serum: 4.3 ng/mL — ABNORMAL HIGH (ref 0.0–4.0)

## 2016-04-01 LAB — FPSA% REFLEX
% FREE PSA: 14.7 %
PSA, FREE: 0.63 ng/mL

## 2016-04-06 ENCOUNTER — Telehealth: Payer: Self-pay | Admitting: Family Medicine

## 2016-04-06 NOTE — Telephone Encounter (Signed)
Pt spouse called stating she make pt an appointment  @ [facility] They need records from g   Last cpx and labs  Last 2 psa and any notes dr g has on prostate They would like them faxed to [phone] His appointment is 04/14/16

## 2016-04-06 NOTE — Telephone Encounter (Signed)
Notes and labs faxed to [facility]/Dr. Yves Dill

## 2016-04-14 DIAGNOSIS — R3914 Feeling of incomplete bladder emptying: Secondary | ICD-10-CM | POA: Diagnosis not present

## 2016-04-14 DIAGNOSIS — R3915 Urgency of urination: Secondary | ICD-10-CM | POA: Diagnosis not present

## 2016-04-14 DIAGNOSIS — R350 Frequency of micturition: Secondary | ICD-10-CM | POA: Diagnosis not present

## 2016-04-14 DIAGNOSIS — R972 Elevated prostate specific antigen [PSA]: Secondary | ICD-10-CM | POA: Diagnosis not present

## 2016-05-02 DIAGNOSIS — N401 Enlarged prostate with lower urinary tract symptoms: Secondary | ICD-10-CM | POA: Diagnosis not present

## 2016-05-04 ENCOUNTER — Ambulatory Visit: Payer: Self-pay

## 2016-05-04 DIAGNOSIS — N401 Enlarged prostate with lower urinary tract symptoms: Secondary | ICD-10-CM | POA: Diagnosis not present

## 2016-05-04 DIAGNOSIS — R972 Elevated prostate specific antigen [PSA]: Secondary | ICD-10-CM | POA: Diagnosis not present

## 2016-05-17 DIAGNOSIS — N401 Enlarged prostate with lower urinary tract symptoms: Secondary | ICD-10-CM | POA: Diagnosis not present

## 2016-05-17 DIAGNOSIS — R3914 Feeling of incomplete bladder emptying: Secondary | ICD-10-CM | POA: Diagnosis not present

## 2016-05-17 DIAGNOSIS — R350 Frequency of micturition: Secondary | ICD-10-CM | POA: Diagnosis not present

## 2016-05-17 DIAGNOSIS — R972 Elevated prostate specific antigen [PSA]: Secondary | ICD-10-CM | POA: Diagnosis not present

## 2016-05-23 NOTE — H&P (Signed)
NAME:  Derrick Horn, Derrick Horn NO.:  [id number]  MEDICAL RECORD NO.:  [id number]  LOCATION:                                 FACILITY:  PHYSICIAN:  Maryan Puls          DATE OF BIRTH:  September 21, 1961  DATE OF ADMISSION:  05/30/2016 DATE OF DISCHARGE:                            HISTORY AND PHYSICAL   Same-day surgery, May 30, 2016.  CHIEF COMPLAINT:  Difficulty voiding.  HISTORY OF PRESENT ILLNESS:  Derrick Horn is a 55 year old male  with significant lower urinary tract symptoms that include incomplete emptying, frequency, intermittency, urgency, weak stream, and nocturia. AUA symptom score is 18 with a quality life score of 4.  He is on Rapaflo 8 mg per day.  He also was noted to have an elevated PSA of 4.3, which was evaluated with ultrasound-guided needle biopsy, May 04, 2016 indicating 73.2 g prostate with trilobar BPH and intravesical growth of median lobe.  The pathology report was benign.  Uroflow study May 02, 2016 indicated a maximum flow rate of 4 mL/second with an average flow of 2.6 mL/second with a residual of 87 mL based upon a voided volume of 176 mL.  The patient comes in now for photovaporization of the prostate with a GreenLight laser.  PAST MEDICAL HISTORY:  The patient is allergic to Z-Pak.  CURRENT MEDICATIONS:  Include Rapaflo.  PAST SURGICAL PROCEDURES:  Include, 1. Right carpal tunnel repair in 1999. 2. Left biceps tendon repair in 2015.  SOCIAL HISTORY:  The patient smokes half a pack a day and has a 20- pack=year history.  He denied alcohol use.  FAMILY HISTORY:  Father is living, age 34; however, has heart disease. The patient has several uncles with prostate cancer.  PAST AND CURRENT MEDICAL CONDITIONS:  Remarkable for degenerative joint disease.  REVIEW OF SYSTEMS:  The patient has chronic insomnia.  He denies chest pain, shortness of breath, diabetes, stroke, or hypertension.  PHYSICAL EXAMINATION:  GENERAL:  Well-nourished white  male, in no distress. HEENT:  Sclerae were clear.  Pupils were equally round, reactive to light and accommodation.  Extraocular movements are intact. NECK:  Supple.  No palpable cervical adenopathy.  No audible carotid bruits. LUNGS:  Clear to auscultation. CARDIOVASCULAR:  Regular rhythm and rate without audible murmurs. ABDOMEN:  Soft, nontender abdomen. GU:  The patient was circumcised.  Testes, smooth and nontender.  25 mL in size each. RECTAL:  Greater than 50 g smooth and nontender prostate. NEUROMUSCULAR:  Alert and oriented x3.  IMPRESSION: 1. Benign prostatic hyperplasia with bladder outlet obstruction. 2. Elevated PSA due to benign prostatic hyperplasia.  PLAN:  Photovaporization of prostate with GreenLight laser.    ______________________________ Maryan Puls   ______________________________ Maryan Puls    MW/MEDQ  D:  05/23/2016  T:  05/23/2016  Job:  [id number]

## 2016-05-24 ENCOUNTER — Encounter: Payer: Self-pay | Admitting: *Deleted

## 2016-05-24 ENCOUNTER — Inpatient Hospital Stay: Admission: RE | Admit: 2016-05-24 | Payer: BLUE CROSS/BLUE SHIELD | Source: Ambulatory Visit

## 2016-05-24 NOTE — Patient Instructions (Signed)
  Your procedure is scheduled on: 05-30-16 Report to [facility] To find out your arrival time please call [phone] between 1PM - 3PM on 05-29-16  Remember: Instructions that are not followed completely may result in serious medical risk, up to and including death, or upon the discretion of your surgeon and anesthesiologist your surgery may need to be rescheduled.    _X___ 1. Do not eat food or drink liquids after midnight. No gum chewing or hard candies.     _X___ 2. No Alcohol for 24 hours before or after surgery.   _X___ 3. Do Not Smoke For 24 Hours Prior to Your Surgery.   ____ 4. Bring all medications with you on the day of surgery if instructed.    ____ 5. Notify your doctor if there is any change in your medical condition     (cold, fever, infections).       Do not wear jewelry, make-up, hairpins, clips or nail polish.  Do not wear lotions, powders, or perfumes. You may wear deodorant.  Do not shave 48 hours prior to surgery. Men may shave face and neck.  Do not bring valuables to the hospital.    [facility] is not responsible for any belongings or valuables.               Contacts, dentures or bridgework may not be worn into surgery.  Leave your suitcase in the car. After surgery it may be brought to your room.  For patients admitted to the hospital, discharge time is determined by your treatment team.   Patients discharged the day of surgery will not be allowed to drive home.   Please read over the following fact sheets that you were given:     __X__ Take these medicines the morning of surgery with A SIP OF WATER:    1. TAKE A PRILOSEC BEFORE BED ON Monday NIGHT  2. PRILOSEC  3.   4.  5.  6.  ____ Fleet Enema (as directed)   ____ Use CHG Soap as directed  ____ Use inhalers on the day of surgery  ____ Stop metformin 2 days prior to surgery    ____ Take 1/2 of usual insulin dose the night before surgery and none on the morning  of surgery.   ____ Stop Coumadin/Plavix/aspirin-N/A  _X___ Stop Anti-inflammatories-NO NSAIDS OR ASA PRODUCTS-TYLENOL OK TO TAKE   ____ Stop supplements until after surgery.    ____ Bring C-Pap to the hospital.

## 2016-05-30 ENCOUNTER — Ambulatory Visit: Payer: BLUE CROSS/BLUE SHIELD | Admitting: Anesthesiology

## 2016-05-30 ENCOUNTER — Encounter: Payer: Self-pay | Admitting: *Deleted

## 2016-05-30 ENCOUNTER — Ambulatory Visit
Admission: RE | Admit: 2016-05-30 | Discharge: 2016-05-30 | Disposition: A | Discharge status: Home / Self Care | Payer: BLUE CROSS/BLUE SHIELD | Source: Ambulatory Visit | Attending: Urology | Admitting: Urology

## 2016-05-30 ENCOUNTER — Encounter
Admission: RE | Disposition: A | Discharge status: Home / Self Care | Payer: Self-pay | Source: Ambulatory Visit | Attending: Urology

## 2016-05-30 DIAGNOSIS — M545 Low back pain: Secondary | ICD-10-CM | POA: Diagnosis not present

## 2016-05-30 DIAGNOSIS — N400 Enlarged prostate without lower urinary tract symptoms: Secondary | ICD-10-CM | POA: Diagnosis not present

## 2016-05-30 DIAGNOSIS — E785 Hyperlipidemia, unspecified: Secondary | ICD-10-CM | POA: Insufficient documentation

## 2016-05-30 DIAGNOSIS — R3912 Poor urinary stream: Secondary | ICD-10-CM | POA: Diagnosis not present

## 2016-05-30 DIAGNOSIS — R3915 Urgency of urination: Secondary | ICD-10-CM | POA: Diagnosis not present

## 2016-05-30 DIAGNOSIS — N401 Enlarged prostate with lower urinary tract symptoms: Secondary | ICD-10-CM | POA: Diagnosis not present

## 2016-05-30 DIAGNOSIS — F17200 Nicotine dependence, unspecified, uncomplicated: Secondary | ICD-10-CM | POA: Insufficient documentation

## 2016-05-30 DIAGNOSIS — Z8042 Family history of malignant neoplasm of prostate: Secondary | ICD-10-CM | POA: Insufficient documentation

## 2016-05-30 DIAGNOSIS — H905 Unspecified sensorineural hearing loss: Secondary | ICD-10-CM | POA: Diagnosis not present

## 2016-05-30 DIAGNOSIS — Z79899 Other long term (current) drug therapy: Secondary | ICD-10-CM | POA: Diagnosis not present

## 2016-05-30 DIAGNOSIS — Z8249 Family history of ischemic heart disease and other diseases of the circulatory system: Secondary | ICD-10-CM | POA: Diagnosis not present

## 2016-05-30 DIAGNOSIS — R3914 Feeling of incomplete bladder emptying: Secondary | ICD-10-CM | POA: Diagnosis not present

## 2016-05-30 DIAGNOSIS — K219 Gastro-esophageal reflux disease without esophagitis: Secondary | ICD-10-CM | POA: Insufficient documentation

## 2016-05-30 DIAGNOSIS — R351 Nocturia: Secondary | ICD-10-CM | POA: Insufficient documentation

## 2016-05-30 DIAGNOSIS — R350 Frequency of micturition: Secondary | ICD-10-CM | POA: Diagnosis not present

## 2016-05-30 DIAGNOSIS — N138 Other obstructive and reflux uropathy: Secondary | ICD-10-CM

## 2016-05-30 HISTORY — PX: GREEN LIGHT LASER TURP (TRANSURETHRAL RESECTION OF PROSTATE: SHX6260

## 2016-05-30 SURGERY — GREEN LIGHT LASER TURP (TRANSURETHRAL RESECTION OF PROSTATE
Anesthesia: General | Wound class: Clean Contaminated

## 2016-05-30 MED ORDER — DEXAMETHASONE SODIUM PHOSPHATE 10 MG/ML IJ SOLN
INTRAMUSCULAR | Status: DC | PRN
  Administered 2016-05-30: 10 mg via INTRAVENOUS

## 2016-05-30 MED ORDER — ONDANSETRON HCL 4 MG/2ML IJ SOLN
INTRAMUSCULAR | Status: AC
  Filled 2016-05-30: qty 2

## 2016-05-30 MED ORDER — ONDANSETRON HCL 4 MG/2ML IJ SOLN
4.0000 mg | Freq: Once | INTRAMUSCULAR | Status: AC | PRN
  Administered 2016-05-30: 4 mg via INTRAVENOUS

## 2016-05-30 MED ORDER — ONDANSETRON HCL 4 MG/2ML IJ SOLN
INTRAMUSCULAR | Status: DC | PRN
  Administered 2016-05-30: 4 mg via INTRAVENOUS

## 2016-05-30 MED ORDER — UROGESIC-BLUE 81.6 MG PO TABS: 1.0000 | ORAL_TABLET | Freq: Four times a day (QID) | ORAL | Status: DC | PRN

## 2016-05-30 MED ORDER — FENTANYL CITRATE (PF) 100 MCG/2ML IJ SOLN
INTRAMUSCULAR | Status: DC | PRN
  Administered 2016-05-30 (×4): 25 ug via INTRAVENOUS
  Administered 2016-05-30: 100 ug via INTRAVENOUS
  Administered 2016-05-30 (×4): 25 ug via INTRAVENOUS

## 2016-05-30 MED ORDER — NUCYNTA 50 MG PO TABS: 50.0000 mg | ORAL_TABLET | Freq: Four times a day (QID) | ORAL | Status: DC | PRN

## 2016-05-30 MED ORDER — IPRATROPIUM-ALBUTEROL 0.5-2.5 (3) MG/3ML IN SOLN
3.0000 mL | Freq: Once | RESPIRATORY_TRACT | Status: AC
  Administered 2016-05-30: 3 mL via RESPIRATORY_TRACT

## 2016-05-30 MED ORDER — BELLADONNA ALKALOIDS-OPIUM 16.2-60 MG RE SUPP
RECTAL | Status: DC | PRN
  Administered 2016-05-30: 1 via RECTAL

## 2016-05-30 MED ORDER — IPRATROPIUM-ALBUTEROL 0.5-2.5 (3) MG/3ML IN SOLN
RESPIRATORY_TRACT | Status: AC
  Filled 2016-05-30: qty 3

## 2016-05-30 MED ORDER — PHENYLEPHRINE HCL 10 MG/ML IJ SOLN
INTRAMUSCULAR | Status: DC | PRN
  Administered 2016-05-30 (×3): 100 ug via INTRAVENOUS

## 2016-05-30 MED ORDER — DEXMEDETOMIDINE HCL 200 MCG/2ML IV SOLN
INTRAVENOUS | Status: DC | PRN
  Administered 2016-05-30: 80 ug via INTRAVENOUS

## 2016-05-30 MED ORDER — FENTANYL CITRATE (PF) 100 MCG/2ML IJ SOLN
INTRAMUSCULAR | Status: AC
  Filled 2016-05-30: qty 2

## 2016-05-30 MED ORDER — BELLADONNA ALKALOIDS-OPIUM 16.2-60 MG RE SUPP
RECTAL | Status: AC
  Filled 2016-05-30: qty 1

## 2016-05-30 MED ORDER — LIDOCAINE HCL (CARDIAC) 20 MG/ML IV SOLN
INTRAVENOUS | Status: DC | PRN
  Administered 2016-05-30: 100 mg via INTRAVENOUS

## 2016-05-30 MED ORDER — LACTATED RINGERS IV SOLN
INTRAVENOUS | Status: DC
  Administered 2016-05-30 (×2): via INTRAVENOUS

## 2016-05-30 MED ORDER — LEVOFLOXACIN IN D5W 500 MG/100ML IV SOLN
INTRAVENOUS | Status: AC
  Filled 2016-05-30: qty 100

## 2016-05-30 MED ORDER — LEVOFLOXACIN 500 MG PO TABS: 500.0000 mg | ORAL_TABLET | Freq: Every day | ORAL | Status: DC

## 2016-05-30 MED ORDER — PROPOFOL 10 MG/ML IV BOLUS
INTRAVENOUS | Status: DC | PRN
  Administered 2016-05-30: 200 mg via INTRAVENOUS

## 2016-05-30 MED ORDER — SODIUM CHLORIDE 0.9 % IR SOLN
Status: DC | PRN
  Administered 2016-05-30: 30000 mL

## 2016-05-30 MED ORDER — FENTANYL CITRATE (PF) 100 MCG/2ML IJ SOLN
25.0000 ug | INTRAMUSCULAR | Status: DC | PRN
  Administered 2016-05-30: 25 ug via INTRAVENOUS
  Administered 2016-05-30: 50 ug via INTRAVENOUS
  Administered 2016-05-30: 25 ug via INTRAVENOUS
  Administered 2016-05-30: 50 ug via INTRAVENOUS

## 2016-05-30 MED ORDER — DOCUSATE SODIUM 100 MG PO CAPS: 200.0000 mg | ORAL_CAPSULE | Freq: Two times a day (BID) | ORAL | Status: DC

## 2016-05-30 MED ORDER — LIDOCAINE HCL 2 % EX GEL
CUTANEOUS | Status: AC
  Filled 2016-05-30: qty 10

## 2016-05-30 MED ORDER — GLYCOPYRROLATE 0.2 MG/ML IJ SOLN
INTRAMUSCULAR | Status: DC | PRN
  Administered 2016-05-30: 0.2 mg via INTRAVENOUS

## 2016-05-30 MED ORDER — LEVOFLOXACIN IN D5W 500 MG/100ML IV SOLN: 500.0000 mg | INTRAVENOUS | Status: DC

## 2016-05-30 MED ORDER — LIDOCAINE HCL 2 % EX GEL
CUTANEOUS | Status: DC | PRN
  Administered 2016-05-30: 1

## 2016-05-30 MED ORDER — MIDAZOLAM HCL 2 MG/2ML IJ SOLN
INTRAMUSCULAR | Status: DC | PRN
  Administered 2016-05-30: 2 mg via INTRAVENOUS

## 2016-05-30 SURGICAL SUPPLY — 32 items
ADAPTER IRRIG TUBE 2 SPIKE SOL (ADAPTER) ×4 IMPLANT
ADPR TBG 2 SPK PMP STRL ASCP (ADAPTER) ×2
BAG URO DRAIN 2000ML W/SPOUT (MISCELLANEOUS) ×2 IMPLANT
CATH FOLEY 2WAY  5CC 20FR SIL (CATHETERS)
CATH FOLEY 2WAY 5CC 20FR SIL (CATHETERS) ×1 IMPLANT
ELECT COAG MONO 22-24F ROLLER (MISCELLANEOUS) ×2
ELECTRODE COAG MONO 22-24F RLR (MISCELLANEOUS) IMPLANT
GLOVE BIO SURGEON STRL SZ7 (GLOVE) ×5 IMPLANT
GLOVE BIO SURGEON STRL SZ7.5 (GLOVE) ×4 IMPLANT
GOWN STRL REUS W/ TWL LRG LVL3 (GOWN DISPOSABLE) ×1 IMPLANT
GOWN STRL REUS W/ TWL XL LVL3 (GOWN DISPOSABLE) ×1 IMPLANT
GOWN STRL REUS W/TWL LRG LVL3 (GOWN DISPOSABLE) ×4
GOWN STRL REUS W/TWL XL LVL3 (GOWN DISPOSABLE) ×2
IV NS 1000ML (IV SOLUTION) ×2
IV NS 1000ML BAXH (IV SOLUTION) ×1 IMPLANT
IV SET PRIMARY 15D 139IN B9900 (IV SETS) ×2 IMPLANT
KIT RM TURNOVER CYSTO AR (KITS) ×2 IMPLANT
LASER GREENLIGHT XPS PROCEDURE (MISCELLANEOUS) ×1 IMPLANT
LASER GRNLGT 950 (MISCELLANEOUS) ×2 IMPLANT
LASER GRNLGT MOXY FIBER 750UM (MISCELLANEOUS) ×2 IMPLANT
PACK CYSTO AR (MISCELLANEOUS) ×2 IMPLANT
PREP PVP WINGED SPONGE (MISCELLANEOUS) ×1 IMPLANT
SET IRRIG Y TYPE TUR BLADDER L (SET/KITS/TRAYS/PACK) ×2 IMPLANT
SOL .9 NS 3000ML IRR  AL (IV SOLUTION) ×11
SOL .9 NS 3000ML IRR AL (IV SOLUTION) ×11
SOL .9 NS 3000ML IRR UROMATIC (IV SOLUTION) ×4 IMPLANT
SOL PREP PVP 2OZ (MISCELLANEOUS) ×2
SOLUTION PREP PVP 2OZ (MISCELLANEOUS) ×1 IMPLANT
SURGILUBE 2OZ TUBE FLIPTOP (MISCELLANEOUS) ×2 IMPLANT
SYRINGE IRR TOOMEY STRL 70CC (SYRINGE) ×2 IMPLANT
TUBING CONNECTING 10 (TUBING) ×2 IMPLANT
WATER STERILE IRR 1000ML POUR (IV SOLUTION) ×3 IMPLANT

## 2016-05-30 NOTE — H&P (Signed)
Date of Initial H&P: 05/23/16  History reviewed, patient examined, no change in status, stable for surgery.

## 2016-05-30 NOTE — Op Note (Signed)
Preoperative diagnosis: BPH with bladder outlet obstruction Postoperative diagnosis: Same  Procedure: Photovaporization of the prostate with greenlight laser and TURP   Surgeon: Otelia Limes. Yves Dill MD, FACS Anesthesia: Gen.  Indications:See the history and physical. After informed consent the above procedure(s) were requested     Technique and findings: After adequate general anesthesia been obtained the patient was placed into dorsal lithotomy position and the perineum was prepped and draped in the usual fashion. The laser scope was coupled to the camera and visually advanced into the bladder. The bladder was mildly trabeculated. Both ureteral orifices were identified and had clear efflux. No bladder tumors were identified. The patient had trilobar BPH with visual obstruction. Estimated prostate gland size was 70 g. At this point the XPS greenlight laser fiber was introduced through the scope and power set at 41 W. Bladder neck tissue and median lobe was vaporized. The power was increased to 120 W and prosthetic tissue from the bladder neck to the verumontanum was vaporized. Several bleeders were encountered at the bladder neck and near the apex which could not be controlled with the laser. The laser scope was then exchanged for the bipolar resectoscope. The bleeders were controlled with cautery. Remaining obstructive tissue was resected and prostate fragments submitted to pathology. All areas of bleeding were attended to with cautery. The resectoscope was removed and 10 cc of viscous Xylocaine instilled within the urethra and the bladder. A 22 French Foley catheter was placed and irrigated until clear. A B&O suppository was placed. Procedure was then terminated and patient transferred to the recovery room stable condition.

## 2016-05-30 NOTE — Discharge Instructions (Signed)
Benign Prostatic Hyperplasia An enlarged prostate (benign prostatic hyperplasia) is common in older men. You may experience the following:  Weak urine stream.  Dribbling.  Feeling like the bladder has not emptied completely.  Difficulty starting urination.  Getting up frequently at night to urinate.  Urinating more frequently during the day. HOME CARE INSTRUCTIONS  Monitor your prostatic hyperplasia for any changes. The following actions may help to alleviate any discomfort you are experiencing:  Give yourself time when you urinate.  Stay away from alcohol.  Avoid beverages containing caffeine, such as coffee, tea, and colas, because they can make the problem worse.  Avoid decongestants, antihistamines, and some prescription medicines that can make the problem worse.  Follow up with your health care provider for further treatment as recommended. SEEK MEDICAL CARE IF:  You are experiencing progressive difficulty voiding.  Your urine stream is progressively getting narrower.  You are awaking from sleep with the urge to void more frequently.  You are constantly feeling the need to void.  You experience loss of urine, especially in small amounts. SEEK IMMEDIATE MEDICAL CARE IF:   You develop increased pain with urination or are unable to urinate.  You develop severe abdominal pain, vomiting, a high fever, or fainting.  You develop back pain or blood in your urine. MAKE SURE YOU:   Understand these instructions.  Will watch your condition.  Will get help right away if you are not doing well or get worse.   This information is not intended to replace advice given to you by your health care provider. Make sure you discuss any questions you have with your health care provider.   Document Released: 11/27/2005 Document Revised: 12/18/2014 Document Reviewed: 04/29/2013 Elsevier Interactive Patient Education 2016 [facility] Light Laser Prostate Treatment Green  light laser therapy is a procedure that uses a special high-energy laser for vaporizing extra prostate tissue. It is less invasive than traditional methods of prostate surgery, which involve cutting out the prostate tissue. Because the tissue is vaporized rather than cut out there is generally less blood loss. LET [facility] CARE PROVIDER KNOW ABOUT:  Any allergies you have.  Any medicines you are taking, including vitamins, herbs, eye drops, creams, and over-the-counter medication.  Previous problems you or members of your family have had with the use of anesthetics.  Any blood disorders you have.  Previous surgeries you have had.  Medical conditions you have. RISKS AND COMPLICATIONS Generally, green light laser prostate treatment is a safe procedure. However, as with any procedure, complications can occur. Possible complications include:  Urinary tract infection.  Erectile dysfunction (rare).  Dry ejaculation--Semen is not released when you reach sexual climax.  Scar tissue in the urinary passage. BEFORE THE PROCEDURE   Your health care provider may discuss medicines you are taking and may advise you to stop taking specific ones.  You may be given antibiotic medicine to take as a precaution against bacterial infection.  Do not eat or drink anything for 8 hours before your procedure or as directed by your health care provider. You may have a sip of water to take any necessary medicines. PROCEDURE Depending on the size and shape of your prostate, the procedure may take 30-60 minutes. You will be given one of the following:   A medicine that makes you go to sleep (general anesthetic).  A medicine injected into your spine that numbs your body below the waist (spinal anesthetic). Sedation is usually given with spinal anesthetic so  you will be relaxed. A tube containing viewing scopes and instruments will be inserted through your penis so that no cuts (incisions) are needed. A thin  fiber is put through the tube and positioned next to the excess prostate tissue. Pulses of laser light come from the end of the fiber and are projected onto the excess tissue. The laser beam is absorbed by your blood, which becomes hot enough to vaporize the excess prostate tissue. This laser beam will seal off the blood vessels, decreasing bleeding. The tube with the viewing scopes, instruments, and thin fiber will be removed and replaced with a temporary catheter. AFTER THE PROCEDURE  After the surgery, you will be sent to the recovery room for a short time. Depending on factors such as the amount of prostate tissue vaporized, the strength of your bladder, and the amount of bleeding expected, the catheter may be removed. Generally, overnight stay is not needed and you will be sent home on the same day as the procedure. You may be sent home with elastic support stockings to help prevent blood clots in your legs.    This information is not intended to replace advice given to you by your health care provider. Make sure you discuss any questions you have with your health care provider.   Document Released: 03/05/2008 Document Revised: 12/02/2013 Document Reviewed: 05/19/2013 Elsevier Interactive Patient Education 2016 [employer] Inc[employer]  Benign Prostatic Hypertrophy The prostate gland is part of the reproductive system of men. A normal prostate is about the size and shape of a walnut. The prostate gland produces a fluid that is mixed with sperm to make semen. This gland surrounds the urethra and is located in front of the rectum and just below the bladder. The bladder is where urine is stored. The urethra is the tube through which urine passes from the bladder to get out of the body. The prostate grows as a man ages. An enlarged prostate not caused by cancer is called benign prostatic hypertrophy (BPH). An enlarged prostate can press on the urethra. This can make it harder to pass urine. In the early stages of  enlargement, the bladder can get by with a narrowed urethra by forcing the urine through. If the problem gets worse, medical or surgical treatment may be required.  This condition should be followed by your health care provider. The accumulation of urine in the bladder can cause infection. Back pressure and infection can progress to bladder damage and kidney (renal) failure. If needed, your health care provider may refer you to a specialist in kidney and prostate disease (urologist). CAUSES  BPH is a common health problem in men older than 50 years. This condition is a normal part of aging. However, not all men will develop problems from this condition. If the enlargement grows away from the urethra, then there will not be any compression of the urethra and resistance to urine flow.If the growth is toward the urethra and compresses it, you will experience difficulty urinating.  SYMPTOMS   Not able to completely empty your bladder.  Getting up often during the night to urinate.  Need to urinate frequently during the day.  Difficultly starting urine flow.  Decrease in size and strength of your urine stream.  Dribbling after urination.  Pain on urination (more common with infection).  Inability to pass urine. This needs immediate treatment.  The development of a urinary tract infection. DIAGNOSIS  These tests will help your health care provider understand your problem:  A  thorough history and physical examination.  A urination history, with the number of times you urinate, the amounts of urine, the strength of the urine stream, and the feeling of emptiness or fullness after urinating.  A postvoid bladder scan that measures any amount of urine that may remain in your bladder after you finish urinating.  Digital rectal exam. In a rectal exam, your health care provider checks your prostate by putting a gloved, lubricated finger into your rectum to feel the back of your prostate gland. This  exam detects the size of your gland and abnormal lumps or growths.  Exam of your urine (urinalysis).  Prostate specific antigen (PSA) screening. This is a blood test used to screen for prostate cancer.  Rectal ultrasonography. This test uses sound waves to electronically produce a picture of your prostate gland. TREATMENT  Once symptoms begin, your health care provider will monitor your condition. Of the men with this condition, one third will have symptoms that stabilize, one third will have symptoms that improve, and one third will have symptoms that progress in the first year. Mild symptoms may not need treatment. Simple observation and yearly exams may be all that is required. Medicines and surgery are options for more severe problems. Your health care provider can help you make an informed decision for what is best. Two classes of medicines are available for relief of prostate symptoms:  Medicines that shrink the prostate. This helps relieve symptoms. These medicines take time to work, and it may be months before any improvement is seen.  Uncommon side effects include problems with sexual function.  Medicines to relax the muscle of the prostate. This also relieves the obstruction by reducing any compression on the urethra.This group of medicines work much faster than those that reduce the size of the prostate gland. Usually, one can experience improvement in days to weeks..  Side effects can include dizziness, fatigue, lightheadedness, and retrograde ejaculation (diminished volume of ejaculate). Several types of surgical treatments are available for relief of prostate symptoms:  Transurethral resection of the prostate (TURP)--In this treatment, an instrument is inserted through opening at the tip of the penis. It is used to cut away pieces of the inner core of the prostate. The pieces are removed through the same opening of the penis. This removes the obstruction and helps get rid of the  symptoms.  Transurethral incision (TUIP)--In this procedure, small cuts are made in the prostate. This lessens the prostates pressure on the urethra.  Transurethral microwave thermotherapy (TUMT)--This procedure uses microwaves to create heat. The heat destroys and removes a small amount of prostate tissue.  Transurethral needle ablation (TUNA)--This is a procedure that uses radio frequencies to do the same as TUMT.  Interstitial laser coagulation (ILC)--This is a procedure that uses a laser to do the same as TUMT and TUNA.  Transurethral electrovaporization (TUVP)--This is a procedure that uses electrodes to do the same as the procedures listed above. SEEK MEDICAL CARE IF:   You develop a fever.  There is unexplained back pain.  Symptoms are not helped by medicines prescribed.  You develop side effects from the medicine you are taking.  Your urine becomes very dark or has a bad smell.  Your lower abdomen becomes distended and you have difficulty passing your urine. SEEK IMMEDIATE MEDICAL CARE IF:   You are suddenly unable to urinate. This is an emergency. You should be seen immediately.  There are large amounts of blood or clots in the urine.  Your  urinary problems become unmanageable.  You develop lightheadedness, severe dizziness, or you feel faint.  You develop moderate to severe low back or flank pain.  You develop chills or fever.   This information is not intended to replace advice given to you by your health care provider. Make sure you discuss any questions you have with your health care provider.   Document Released: 11/27/2005 Document Revised: 12/02/2013 Document Reviewed: 06/12/2013 Elsevier Interactive Patient Education 2016 Elsevier Inc.  Transurethral Resection of the Prostate Transurethral resection of the prostate (TURP) is the removal of part of your prostate to treat noncancerous (benign) prostatic hyperplasia (BPH). BPH typically occurs in men older  than 40 years. It is the abnormal growth of cells in your prostate. Specifically, it is an abnormal increase in the number of cells that make up your prostate tissue. This causes an increase in the size of your prostate. Often, in the case of BPH, the prostate becomes so large that it compresses the tube that drains urine out of your body from your bladder (urethra). Eventually, this compression can obstruct the flow of urine from your bladder. This obstruction can cause recurrent bladder infection and difficulties with bladder control and bladder emptying. The goal of TURP is to remove enough prostate tissue to allow for an unobstructed flow of urine, which often resolves the associated conditions. LET YOUR CAREGIVER KNOW ABOUT:  Any allergies you have.  Any medicines you are taking, including herbs, eye drops, over-the-counter medicines, and creams.  Any problems you have had with the use of anesthetics.  Any blood disorders you have, including bleeding problems and clotting problems.  Previous surgeries you have had.  Any prostate infections you have had. RISKS AND COMPLICATIONS Generally, TURP is a safe procedure. However, as with any surgical procedure, complications can occur. Possible complications associated with TURP include:  Difficulty getting an erection.  Scarring, which may cause problems with the flow of your urine.  Injury to your urethra.  Incontinence from injury to the muscle that surrounds your prostate, which controls urine flow.  Infection.  Bleeding.  Injury to your bladder (rare). BEFORE THE PROCEDURE  Your caregiver will tell you when you need to stop eating and drinking. If you take any medicines, your caregiver will tell you which ones you may keep taking and which ones you will have to stop taking and when.  Just before the procedure you will also receive medicine to make you fall asleep (general anesthetic). This will be given through a tube that is  inserted into one of your veins (intravenous [IV] tube). PROCEDURE Your surgeon inserts an instrument that is similar to a telescope with an electric cutting edge (resectoscope) through your urethra to the area of the prostate gland. The cutting edge is used to remove enlarged pieces of your prostate, one piece at a time. At the end of your procedure, a flexible tube (catheter) will be inserted into your urethra to drain your bladder. Special plastic bags filled with solution will be connected to the end of the catheter. The solution will be used to irrigate blood from your bladder while you heal.  AFTER THE PROCEDURE You will be taken to the recovery area. Once you are awake, stable, and taking fluids well, you will be taken to your hospital room. Typically, you will stay in the hospital 1-2 days after this procedure. The catheter usually is removed before discharge from the hospital.   This information is not intended to replace advice given to  you by your health care provider. Make sure you discuss any questions you have with your health care provider.   Document Released: 11/27/2005 Document Revised: 12/18/2014 Document Reviewed: 04/29/2012 Elsevier Interactive Patient Education 2016 Elsevier Inc.  Prostate Laser Surgery Prostate laser surgery is a procedure to eliminate prostate tissue. There are two types of prostate laser surgery: ablation (prostate tissue is melted away) and enucleation (prostate tissue is cut out). LET [facility]OUR HEALTH CARE PROVIDER KNOW ABOUT:  Any allergies you have.  All medicines you are taking, including vitamins, herbs, eye drops, creams, and over-the-counter medicines.  Previous problems you or members of your family have had with the use of anesthetics.  Any blood disorders you have.  Previous surgeries you have had.  Medical conditions you have. RISKS AND COMPLICATIONS  Generally prostate laser surgery is a safe procedure. However, as with any procedure,  problems can occur. Possible problems include:  Bleeding and the need for a blood transfusion.   Urinary tract infection.  Erectile dysfunction.  Narrowing (scar or stricture) of the urethra, which blocks the flow of urine.  Dry ejaculation (semen is not released when you reach sexual climax). BEFORE THE PROCEDURE   If you are on blood thinners, such as warfarin or clopidogrel, or nonprescription pain-relieving medicines, such as naproxen sodium or ibuprofen, you may be asked to stop taking them before the procedure.  Your health care provider may ask you to start taking antibiotic medicines before the procedure as a precaution against a bacterial infection. The procedure will not be performed if your urine is infected.  You should have nothing to eat or drink for at least 8 hours before your procedure, or as suggested by your health care provider. You may have a sip of water to take medications not stopped for the procedure. PROCEDURE  You will be given one of the following:   A medicine that numbs the area (local anesthetic).  A medicine injected into your spine that numbs your body below the waist (spinal anesthetic). A sedative is usually given with spinal anesthetic so you will be relaxed during the procedure. A viewing scope and instruments will be placed in a tube that is inserted through your penis, so no incisions will be needed to insert the scope and instruments. Depending on the type of laser used, the prostate tissue will either be vaporized or cut away. The laser beam will coagulate any small bleeding areas. At the end of the surgery, a special tube will be inserted into your bladder to drain the urine from your bladder (urinary catheter). AFTER THE PROCEDURE You will be sent to the recovery room for a short time. In the recovery room, you will receive fluids through an IV tube inserted in one of your veins. Your blood pressure and pulse will be checked frequently to make  sure that they stabilize. Once you are eating and drinking fluids appropriately, the IV tube will be removed.  Depending on your specific needs, you may be admitted to the hospital or you will be sent home after the procedure. If you are sent home:  You may be sent home with elastic support stockings to help prevent blood clots in your legs.  You will also probably be given an antibiotic medicine.  Unless told otherwise, you may restart your other medications.  You may be given a stool softener.   This information is not intended to replace advice given to you by your health care provider. Make sure you discuss any  questions you have with your health care provider.   Document Released: 11/27/2005 Document Revised: 12/02/2013 Document Reviewed: 05/19/2013 Elsevier Interactive Patient Education 2016 [facility], Care After Refer to this sheet in the next few weeks. These instructions provide you with information on caring for yourself after your procedure. Your health care provider may also give you more specific instructions. Your treatment has been planned according to current medical practices, but problems sometimes occur. Call your health care provider if you have any problems or questions after your procedure. WHAT TO EXPECT AFTER THE PROCEDURE  You may notice blood in your urine that could last up to 3 weeks.  After your catheter is removed, you will have burning (especially at the tip of your penis) when you urinate, especially at the end of urination. For the first few weeks after your procedure, you will feel the need to urinate often. HOME CARE INSTRUCTIONS   Do not perform vigorous exercise, especially heavy lifting, for 1 week or as directed by your health care provider.  Avoid sexual activity for 4-6 weeks or as directed by your health care provider.  Do not ride in a car for extended periods for 1 month or as directed by your health care  provider.  Do not strain to have a bowel movement. Drink a lot of fluids and and make sure you get enough fiber in your diet.  Drink enough fluids to keep your urine clear or pale yellow. SEEK IMMEDIATE MEDICAL CARE IF:   Your catheter has been removed and you are suddenly unable to urinate.  Your catheter has not been removed, and it develops a blockage.  You start to have blood clots in your urine.  The blood in your urine becomes persistent or gets thick.  Your temperature is greater than 100.63F (38.1C).  You develop chest pains.  You develop shortness of breath.  You develop leg swelling or pain. MAKE SURE YOU:  Understand these instructions.  Will watch your condition.  Will get help right away if you are not doing well or get worse.   This information is not intended to replace advice given to you by your health care provider. Make sure you discuss any questions you have with your health care provider.   Document Released: 11/27/2005 Document Revised: 12/02/2013 Document Reviewed: 05/19/2013 Elsevier Interactive Patient Education [employer].

## 2016-05-30 NOTE — Anesthesia Procedure Notes (Signed)
Procedure Name: LMA Insertion Date/Time: 05/30/2016 5:24 PM Performed by: Nelda Marseille Pre-anesthesia Checklist: Patient identified, Patient being monitored, Timeout performed, Emergency Drugs available and Suction available Patient Re-evaluated:Patient Re-evaluated prior to inductionOxygen Delivery Method: Circle system utilized Preoxygenation: Pre-oxygenation with 100% oxygen Intubation Type: IV induction Ventilation: Mask ventilation without difficulty LMA: LMA inserted LMA Size: 4.5 Tube type: Oral Number of attempts: 1 Placement Confirmation: positive ETCO2 and breath sounds checked- equal and bilateral Tube secured with: Tape Dental Injury: Teeth and Oropharynx as per pre-operative assessment

## 2016-05-30 NOTE — Anesthesia Preprocedure Evaluation (Addendum)
Anesthesia Evaluation  Patient identified by MRN, date of birth, ID band Patient awake    Reviewed: Allergy & Precautions, H&P , NPO status , Patient's Chart, lab work & pertinent test results, reviewed documented beta blocker date and time   History of Anesthesia Complications Negative for: history of anesthetic complications  Airway Mallampati: II  TM Distance: >3 FB Neck ROM: full    Dental no notable dental hx. (+) Caps, Teeth Intact   Pulmonary neg shortness of breath, neg sleep apnea, COPD (very mild), neg recent URI, Current Smoker,    Pulmonary exam normal breath sounds clear to auscultation       Cardiovascular Exercise Tolerance: Good negative cardio ROS Normal cardiovascular exam Rhythm:regular Rate:Normal     Neuro/Psych negative neurological ROS  negative psych ROS   GI/Hepatic Neg liver ROS, GERD  ,  Endo/Other  negative endocrine ROS  Renal/GU negative Renal ROS  negative genitourinary   Musculoskeletal   Abdominal   Peds  Hematology negative hematology ROS (+)   Anesthesia Other Findings Past Medical History:   History of chest pain                           2010           Comment:stress test WNL   GERD (gastroesophageal reflux disease)                         Comment:mild   Chronic LBP                                                  Recurrent sinus infections                                   HLD (hyperlipidemia)                                           Comment:mild   Tear of left biceps muscle                      01/19/2014     Sensorineural hearing loss (SNHL) of both ears  12/2015         Comment:mild-mod, no need for aides, rec melatoonin and              lipoflavonoid Therapist, occupational([facility])   COPD (chronic obstructive pulmonary disease) (*                Comment:minimal emphysema on CT scan   Reproductive/Obstetrics negative OB ROS                             Anesthesia  Physical Anesthesia Plan  ASA: II  Anesthesia Plan: General   Post-op Pain Management:    Induction:   Airway Management Planned:   Additional Equipment:   Intra-op Plan:   Post-operative Plan:   Informed Consent: I have reviewed the patients History and Physical, chart, labs and discussed the procedure including the risks, benefits and alternatives for the proposed anesthesia with the patient or  authorized representative who has indicated his/her understanding and acceptance.   Dental Advisory Given  Plan Discussed with: Anesthesiologist, CRNA and Surgeon  Anesthesia Plan Comments:         Anesthesia Quick Evaluation

## 2016-05-30 NOTE — Transfer of Care (Addendum)
Immediate Anesthesia Transfer of Care Note  Patient: Derrick Horn  Procedure(s) Performed: Procedure(s): GREEN LIGHT LASER TURP (TRANSURETHRAL RESECTION OF PROSTATE (N/A)  Patient Location: PACU  Anesthesia Type:General  Level of Consciousness: awake and confused  Airway & Oxygen Therapy: Patient Spontanous Breathing and Patient connected to face mask oxygen  Post-op Assessment: Report given to RN  Post vital signs: Reviewed and stable  Last Vitals:  Filed Vitals:   05/30/16 1401  BP: 136/104  Pulse: 44  Temp: 36.8 C  Resp: 16    Last Pain: There were no vitals filed for this visit.       Complications: No apparent anesthesia complications O2 sat reading artifact due to shivering. Pt. Talking and taking deep breaths.

## 2016-05-31 ENCOUNTER — Encounter: Payer: Self-pay | Admitting: Urology

## 2016-05-31 NOTE — Anesthesia Postprocedure Evaluation (Signed)
Anesthesia Post Note  Patient: Derrick Horn  Procedure(s) Performed: Procedure(s) (LRB): GREEN LIGHT LASER TURP (TRANSURETHRAL RESECTION OF PROSTATE (N/A)  Patient location during evaluation: PACU Anesthesia Type: General Level of consciousness: awake and alert Pain management: pain level controlled Vital Signs Assessment: post-procedure vital signs reviewed and stable Respiratory status: spontaneous breathing, nonlabored ventilation, respiratory function stable and patient connected to nasal cannula oxygen Cardiovascular status: blood pressure returned to baseline and stable Postop Assessment: no signs of nausea or vomiting Anesthetic complications: no    Last Vitals:  Filed Vitals:   05/30/16 2155 05/30/16 2200  BP:  110/78  Pulse: 89 92  Temp:    Resp:      Last Pain:  Filed Vitals:   05/30/16 2233  PainSc: 2                  Martha Clan

## 2016-06-02 DIAGNOSIS — R300 Dysuria: Secondary | ICD-10-CM | POA: Diagnosis not present

## 2016-06-02 DIAGNOSIS — R310 Gross hematuria: Secondary | ICD-10-CM | POA: Diagnosis not present

## 2016-06-02 DIAGNOSIS — N401 Enlarged prostate with lower urinary tract symptoms: Secondary | ICD-10-CM | POA: Diagnosis not present

## 2016-06-02 LAB — SURGICAL PATHOLOGY

## 2016-06-20 DIAGNOSIS — R972 Elevated prostate specific antigen [PSA]: Secondary | ICD-10-CM | POA: Diagnosis not present

## 2016-06-20 DIAGNOSIS — N401 Enlarged prostate with lower urinary tract symptoms: Secondary | ICD-10-CM | POA: Diagnosis not present

## 2016-07-19 ENCOUNTER — Ambulatory Visit (INDEPENDENT_AMBULATORY_CARE_PROVIDER_SITE_OTHER): Payer: BLUE CROSS/BLUE SHIELD | Admitting: Family Medicine

## 2016-07-19 ENCOUNTER — Encounter: Payer: Self-pay | Admitting: Family Medicine

## 2016-07-19 VITALS — BP 126/92 | HR 80 | Temp 99.4°F | Ht 70.0 in | Wt 180.8 lb

## 2016-07-19 DIAGNOSIS — J069 Acute upper respiratory infection, unspecified: Secondary | ICD-10-CM

## 2016-07-19 DIAGNOSIS — R059 Cough, unspecified: Secondary | ICD-10-CM

## 2016-07-19 DIAGNOSIS — R05 Cough: Secondary | ICD-10-CM | POA: Diagnosis not present

## 2016-07-19 MED ORDER — AMOXICILLIN 875 MG PO TABS: 875.0000 mg | ORAL_TABLET | Freq: Two times a day (BID) | ORAL | 0 refills | Status: DC

## 2016-07-19 MED ORDER — BENZONATATE 100 MG PO CAPS: 100.0000 mg | ORAL_CAPSULE | Freq: Three times a day (TID) | ORAL | 0 refills | Status: DC | PRN

## 2016-07-19 NOTE — Progress Notes (Addendum)
Subjective:    Patient ID: Derrick Horn, male    DOB: 01-29-1961, 55 y.o.   MRN: [id number]  HPI This is a 55 yo male who presents today with 2 days of runny nose, cough, muscle aches. Scratchy throat, watery/green nasal drainage. Sinus headache over eyes. No known sick contacts. Has been taking some advil cold and sinus and loratadine daily without relief. Occasional cough, non productive, no wheeze or SOB. Smokes 2 cigarettes a day. Has recurrent URIs, has had decrease in incidence since he has decreased smoking. History of COPD. He will be flying next week and is concerned about continued symptoms. He does not tolerate Sudafed- makes him "jacked up."    Past Medical History:  Diagnosis Date  . Chronic LBP   . COPD (chronic obstructive pulmonary disease) ([place])    minimal emphysema on CT scan  . GERD (gastroesophageal reflux disease)    mild  . History of chest pain 2010   stress test WNL  . HLD (hyperlipidemia)    mild  . Recurrent sinus infections   . Sensorineural hearing loss (SNHL) of both ears 12/2015   mild-mod, no need for aides, rec melatoonin and lipoflavonoid [facility])  . Tear of left biceps muscle 01/19/2014   Past Surgical History:  Procedure Laterality Date  . BICEPS TENDON REPAIR  2015   [name]  . CARPAL TUNNEL RELEASE     right  . chest CT  2011   no pulm nodules, mild apical scarring/emphysematous changes  . COLONOSCOPY  09/2010   2 hyperplastic polyps [name])  . ETT  2010   WNL (Dr. Johnsie Cancel)  . GREEN LIGHT LASER TURP (TRANSURETHRAL RESECTION OF PROSTATE N/A 05/30/2016   Procedure: GREEN LIGHT LASER TURP (TRANSURETHRAL RESECTION OF PROSTATE;  Surgeon: Royston Cowper, MD;  Location: [facility] ORS;  Service: Urology;  Laterality: N/A;   Family History  Problem Relation Age of Onset  . Coronary artery disease Maternal Grandfather 35  . Stroke Maternal Grandfather   . Coronary artery disease Father 58    s/p stents, CABG  . Cancer Maternal Grandmother    uterine  . Diabetes Neg Hx    Social History  Substance Use Topics  . Smoking status: Current Every Day Smoker    Packs/day: 0.50    Years: 30.00    Types: Cigarettes  . Smokeless tobacco: Never Used  . Alcohol use No      Review of Systems Per HPI    Objective:   Physical Exam  Constitutional: He appears well-developed and well-nourished.  HENT:  Head: Normocephalic and atraumatic.  Right Ear: Tympanic membrane, external ear and ear canal normal.  Left Ear: Tympanic membrane, external ear and ear canal normal.  Nose: Mucosal edema and rhinorrhea present. Right sinus exhibits no maxillary sinus tenderness and no frontal sinus tenderness. Left sinus exhibits no maxillary sinus tenderness and no frontal sinus tenderness.  Mouth/Throat: Uvula is midline. Posterior oropharyngeal erythema present.  Eyes: Conjunctivae are normal.  Neck: Normal range of motion. Neck supple.  Cardiovascular: Normal rate, regular rhythm and normal heart sounds.   Lymphadenopathy:    He has no cervical adenopathy.  Skin: Skin is warm and dry.  Psychiatric: He has a normal mood and affect. His behavior is normal. Judgment and thought content normal.  Vitals reviewed.     BP (!) 126/92 (BP Location: Left Arm, Patient Position: Sitting, Cuff Size: Large)   Pulse 80   Temp 99.4 F (37.4 C) (Oral)  Ht 5\' 10"  (1.778 m)   Wt 180 lb 12.8 oz (82 kg)   SpO2 97%   BMI 25.94 kg/m  Wt Readings from Last 3 Encounters:  07/19/16 180 lb 12.8 oz (82 kg)  05/30/16 178 lb (80.7 kg)  03/31/16 183 lb (83 kg)       Assessment & Plan:  1. Acute upper respiratory infection - will go ahead and treat with antibiotic due to history of recurrent URI, long standing smoking history and COPD history.  - amoxicillin (AMOXIL) 875 MG tablet; Take 1 tablet (875 mg total) by mouth 2 (two) times daily.  Dispense: 14 tablet; Refill: 0 - Provided written and verbal information regarding diagnosis and treatment. -  provided suggestions for symptomatic treatment relief measures  - RTC precautions reviewed  2. Cough - benzonatate (TESSALON) 100 MG capsule; Take 1-2 capsules (100-200 mg total) by mouth 3 (three) times daily as needed for cough.  Dispense: 20 capsule; Refill: 0   Clarene Reamer, FNP-BC   [place] [facility] [place]  07/19/2016 4:41 PM

## 2016-07-19 NOTE — Patient Instructions (Signed)
Please get a thermometer to monitor your temperature when you are sick. For muscle aches, headache, sore throat you can take over the counter acetaminophen or ibuprofen as directed on the package For nasal congestion you can use Afrin nasal spray twice a day for up to 3 days, and /or sudafed, and/or saline nasal spray For cough you can use Delsym cough syrup Please come back to see [place] or go to the emergency department if you are not better in 5 to 7 days or if you develop fever over 101 for more than 48 hours or if you develop wheezing or shortness of breath.

## 2016-09-04 DIAGNOSIS — L723 Sebaceous cyst: Secondary | ICD-10-CM | POA: Diagnosis not present

## 2016-09-04 DIAGNOSIS — R972 Elevated prostate specific antigen [PSA]: Secondary | ICD-10-CM | POA: Diagnosis not present

## 2016-09-09 DIAGNOSIS — Z23 Encounter for immunization: Secondary | ICD-10-CM | POA: Diagnosis not present

## 2016-10-10 DIAGNOSIS — N401 Enlarged prostate with lower urinary tract symptoms: Secondary | ICD-10-CM | POA: Diagnosis not present

## 2016-10-10 DIAGNOSIS — R972 Elevated prostate specific antigen [PSA]: Secondary | ICD-10-CM | POA: Diagnosis not present

## 2017-02-27 DIAGNOSIS — R300 Dysuria: Secondary | ICD-10-CM | POA: Diagnosis not present

## 2017-02-27 DIAGNOSIS — N401 Enlarged prostate with lower urinary tract symptoms: Secondary | ICD-10-CM | POA: Diagnosis not present

## 2017-03-06 ENCOUNTER — Encounter: Payer: Self-pay | Admitting: Family

## 2017-03-06 ENCOUNTER — Ambulatory Visit (INDEPENDENT_AMBULATORY_CARE_PROVIDER_SITE_OTHER): Payer: BLUE CROSS/BLUE SHIELD | Admitting: Family

## 2017-03-06 VITALS — BP 120/84 | HR 67 | Temp 97.6°F | Ht 70.0 in | Wt 186.2 lb

## 2017-03-06 DIAGNOSIS — J069 Acute upper respiratory infection, unspecified: Secondary | ICD-10-CM | POA: Diagnosis not present

## 2017-03-06 MED ORDER — HYDROCODONE-HOMATROPINE 5-1.5 MG/5ML PO SYRP: 5.0000 mL | ORAL_SOLUTION | Freq: Every evening | ORAL | 0 refills | Status: DC | PRN

## 2017-03-06 MED ORDER — AMOXICILLIN 500 MG PO CAPS: 500.0000 mg | ORAL_CAPSULE | Freq: Two times a day (BID) | ORAL | 0 refills | Status: DC

## 2017-03-06 NOTE — Progress Notes (Signed)
Pre visit review using our clinic review tool, if applicable. No additional management support is needed unless otherwise documented below in the visit note. 

## 2017-03-06 NOTE — Patient Instructions (Signed)
I suspect that your infection is viral in nature.  Stay on mucinex and drink plenty of water  As discussed, I advise that you wait to fill the antibiotic after 1-2 days of symptom management to see if your symptoms improve. If you do not show improvement, you may take the antibiotic as prescribed.   Please take cough medication at night only as needed. As we discussed, I do not recommend dosing throughout the day as coughing is a protective mechanism . It also helps to break up thick mucous.  Do not take cough suppressants with alcohol as can lead to trouble breathing. Advise caution if taking cough suppressant and operating machinery ( i.e driving a car) as you may feel very tired.    Let me know if you are not better

## 2017-03-06 NOTE — Progress Notes (Signed)
Subjective:    Patient ID: Derrick Horn, male    DOB: 1961/04/30, 56 y.o.   MRN: [id number]  CC: Derrick Horn is a 56 y.o. male who presents today for an acute visit.    HPI: CC: cough, thick nasal drainage x 5 days, unchanged. Endorses sore throat, post nasal drip.  Tried mucinex with some relief. Sudafed helped more. No fever, chills, SOB, HA. Augemtin works well for him. Delsyn not helping.    Former smoker    HISTORY:  Past Medical History:  Diagnosis Date  . Chronic LBP   . COPD (chronic obstructive pulmonary disease) ([place])    minimal emphysema on CT scan  . GERD (gastroesophageal reflux disease)    mild  . History of chest pain 2010   stress test WNL  . HLD (hyperlipidemia)    mild  . Recurrent sinus infections   . Sensorineural hearing loss (SNHL) of both ears 12/2015   mild-mod, no need for aides, rec melatoonin and lipoflavonoid [facility])  . Tear of left biceps muscle 01/19/2014   Past Surgical History:  Procedure Laterality Date  . BICEPS TENDON REPAIR  2015   [name]  . CARPAL TUNNEL RELEASE     right  . chest CT  2011   no pulm nodules, mild apical scarring/emphysematous changes  . COLONOSCOPY  09/2010   2 hyperplastic polyps [name])  . ETT  2010   WNL (Dr. Johnsie Cancel)  . GREEN LIGHT LASER TURP (TRANSURETHRAL RESECTION OF PROSTATE N/A 05/30/2016   Procedure: GREEN LIGHT LASER TURP (TRANSURETHRAL RESECTION OF PROSTATE;  Surgeon: Royston Cowper, MD;  Location: [facility] ORS;  Service: Urology;  Laterality: N/A;   Family History  Problem Relation Age of Onset  . Coronary artery disease Maternal Grandfather 35  . Stroke Maternal Grandfather   . Coronary artery disease Father 94    s/p stents, CABG  . Cancer Maternal Grandmother     uterine  . Diabetes Neg Hx     Allergies: Azithromycin No current outpatient prescriptions on file prior to visit.   No current facility-administered medications on file prior to visit.     Social History  Substance  Use Topics  . Smoking status: Current Every Day Smoker    Packs/day: 0.50    Years: 30.00    Types: Cigarettes  . Smokeless tobacco: Never Used  . Alcohol use No    Review of Systems  Constitutional: Negative for chills and fever.  HENT: Positive for congestion and sore throat. Negative for ear pain, sinus pain and sinus pressure.   Respiratory: Positive for cough. Negative for shortness of breath and wheezing.   Cardiovascular: Negative for chest pain and palpitations.  Gastrointestinal: Negative for nausea and vomiting.      Objective:    BP 120/84   Pulse 67   Temp 97.6 F (36.4 C) (Oral)   Ht 5\' 10"  (1.778 m)   Wt 186 lb 3.2 oz (84.5 kg)   SpO2 96%   BMI 26.72 kg/m    Physical Exam  Constitutional: Vital signs are normal. He appears well-developed and well-nourished.  HENT:  Head: Normocephalic and atraumatic.  Right Ear: Hearing, tympanic membrane, external ear and ear canal normal. No drainage, swelling or tenderness. Tympanic membrane is not injected, not erythematous and not bulging. No middle ear effusion. No decreased hearing is noted.  Left Ear: Hearing, tympanic membrane, external ear and ear canal normal. No drainage, swelling or tenderness. Tympanic membrane is not injected, not  erythematous and not bulging.  No middle ear effusion. No decreased hearing is noted.  Nose: Nose normal. Right sinus exhibits no maxillary sinus tenderness and no frontal sinus tenderness. Left sinus exhibits no maxillary sinus tenderness and no frontal sinus tenderness.  Mouth/Throat: Uvula is midline, oropharynx is clear and moist and mucous membranes are normal. No oropharyngeal exudate, posterior oropharyngeal edema, posterior oropharyngeal erythema or tonsillar abscesses.  Eyes: Conjunctivae are normal.  Cardiovascular: Regular rhythm and normal heart sounds.   Pulmonary/Chest: Effort normal and breath sounds normal. No respiratory distress. He has no wheezes. He has no rhonchi. He  has no rales.  Lymphadenopathy:       Head (right side): No submental, no submandibular, no tonsillar, no preauricular, no posterior auricular and no occipital adenopathy present.       Head (left side): No submental, no submandibular, no tonsillar, no preauricular, no posterior auricular and no occipital adenopathy present.    He has no cervical adenopathy.  Neurological: He is alert.  Skin: Skin is warm and dry.  Psychiatric: He has a normal mood and affect. His speech is normal and behavior is normal.  Vitals reviewed.      Assessment & Plan:   1. Acute upper respiratory infection Working diagnosis of viral URI x 6 days. No acute respiratory distress. Afebrile. Advised to wait 1-2 days prior to filling antibiotic. Cough syrup cautiously at bedtime                     - HYDROcodone-homatropine (HYCODAN) 5-1.5 MG/5ML syrup; Take 5 mLs by mouth at bedtime as needed for cough.  Dispense: 30 mL; Refill: 0 - amoxicillin (AMOXIL) 500 MG capsule; Take 1 capsule (500 mg total) by mouth 2 (two) times daily.  Dispense: 14 capsule; Refill: 0   I have discontinued [name] amoxicillin and benzonatate.   No orders of the defined types were placed in this encounter.   Return precautions given.   Risks, benefits, and alternatives of the medications and treatment plan prescribed today were discussed, and patient expressed understanding.   Education regarding symptom management and diagnosis given to patient on AVS.  Continue to follow with Ria Bush, MD for routine health maintenance.   Derrick Horn and I agreed with plan.   Mable Paris, FNP

## 2017-04-29 DIAGNOSIS — H00019 Hordeolum externum unspecified eye, unspecified eyelid: Secondary | ICD-10-CM | POA: Diagnosis not present

## 2017-07-12 DIAGNOSIS — R972 Elevated prostate specific antigen [PSA]: Secondary | ICD-10-CM | POA: Diagnosis not present

## 2017-07-17 DIAGNOSIS — N401 Enlarged prostate with lower urinary tract symptoms: Secondary | ICD-10-CM | POA: Diagnosis not present

## 2017-07-17 DIAGNOSIS — R300 Dysuria: Secondary | ICD-10-CM | POA: Diagnosis not present

## 2017-07-17 DIAGNOSIS — R102 Pelvic and perineal pain: Secondary | ICD-10-CM | POA: Diagnosis not present

## 2018-02-12 DIAGNOSIS — M25511 Pain in right shoulder: Secondary | ICD-10-CM | POA: Diagnosis not present

## 2018-02-20 DIAGNOSIS — M25511 Pain in right shoulder: Secondary | ICD-10-CM | POA: Diagnosis not present

## 2018-03-01 DIAGNOSIS — M25511 Pain in right shoulder: Secondary | ICD-10-CM | POA: Diagnosis not present

## 2018-03-01 DIAGNOSIS — M75121 Complete rotator cuff tear or rupture of right shoulder, not specified as traumatic: Secondary | ICD-10-CM | POA: Diagnosis not present

## 2018-03-03 ENCOUNTER — Encounter: Payer: Self-pay | Admitting: Family Medicine

## 2018-03-03 DIAGNOSIS — M75101 Unspecified rotator cuff tear or rupture of right shoulder, not specified as traumatic: Secondary | ICD-10-CM | POA: Insufficient documentation

## 2018-03-03 DIAGNOSIS — M12811 Other specific arthropathies, not elsewhere classified, right shoulder: Secondary | ICD-10-CM | POA: Insufficient documentation

## 2018-03-27 DIAGNOSIS — S46011D Strain of muscle(s) and tendon(s) of the rotator cuff of right shoulder, subsequent encounter: Secondary | ICD-10-CM | POA: Diagnosis not present

## 2018-03-27 DIAGNOSIS — M25511 Pain in right shoulder: Secondary | ICD-10-CM | POA: Diagnosis not present

## 2018-04-10 HISTORY — PX: SHOULDER ARTHROSCOPY WITH SUBACROMIAL DECOMPRESSION, ROTATOR CUFF REPAIR AND BICEP TENDON REPAIR: SHX5687

## 2018-05-03 DIAGNOSIS — M7541 Impingement syndrome of right shoulder: Secondary | ICD-10-CM | POA: Diagnosis not present

## 2018-05-03 DIAGNOSIS — S43011D Anterior subluxation of right humerus, subsequent encounter: Secondary | ICD-10-CM | POA: Diagnosis not present

## 2018-05-03 DIAGNOSIS — S46011D Strain of muscle(s) and tendon(s) of the rotator cuff of right shoulder, subsequent encounter: Secondary | ICD-10-CM | POA: Diagnosis not present

## 2018-05-03 DIAGNOSIS — S46291A Other injury of muscle, fascia and tendon of other parts of biceps, right arm, initial encounter: Secondary | ICD-10-CM | POA: Diagnosis not present

## 2018-05-03 DIAGNOSIS — M75121 Complete rotator cuff tear or rupture of right shoulder, not specified as traumatic: Secondary | ICD-10-CM | POA: Diagnosis not present

## 2018-05-03 DIAGNOSIS — R600 Localized edema: Secondary | ICD-10-CM | POA: Diagnosis not present

## 2018-05-03 DIAGNOSIS — S46011A Strain of muscle(s) and tendon(s) of the rotator cuff of right shoulder, initial encounter: Secondary | ICD-10-CM | POA: Diagnosis not present

## 2018-05-03 DIAGNOSIS — Z4789 Encounter for other orthopedic aftercare: Secondary | ICD-10-CM | POA: Diagnosis not present

## 2018-05-03 DIAGNOSIS — S43431A Superior glenoid labrum lesion of right shoulder, initial encounter: Secondary | ICD-10-CM | POA: Diagnosis not present

## 2018-05-03 DIAGNOSIS — M19011 Primary osteoarthritis, right shoulder: Secondary | ICD-10-CM | POA: Diagnosis not present

## 2018-05-03 DIAGNOSIS — M7521 Bicipital tendinitis, right shoulder: Secondary | ICD-10-CM | POA: Diagnosis not present

## 2018-05-03 DIAGNOSIS — G8918 Other acute postprocedural pain: Secondary | ICD-10-CM | POA: Diagnosis not present

## 2018-05-15 DIAGNOSIS — M25611 Stiffness of right shoulder, not elsewhere classified: Secondary | ICD-10-CM | POA: Diagnosis not present

## 2018-05-21 ENCOUNTER — Encounter: Payer: Self-pay | Admitting: Family Medicine

## 2018-05-21 DIAGNOSIS — M25611 Stiffness of right shoulder, not elsewhere classified: Secondary | ICD-10-CM | POA: Diagnosis not present

## 2018-05-28 DIAGNOSIS — M25611 Stiffness of right shoulder, not elsewhere classified: Secondary | ICD-10-CM | POA: Diagnosis not present

## 2018-06-04 DIAGNOSIS — M25611 Stiffness of right shoulder, not elsewhere classified: Secondary | ICD-10-CM | POA: Diagnosis not present

## 2018-06-11 DIAGNOSIS — M25611 Stiffness of right shoulder, not elsewhere classified: Secondary | ICD-10-CM | POA: Diagnosis not present

## 2018-06-15 ENCOUNTER — Encounter: Payer: Self-pay | Admitting: Family Medicine

## 2018-06-19 DIAGNOSIS — M25611 Stiffness of right shoulder, not elsewhere classified: Secondary | ICD-10-CM | POA: Diagnosis not present

## 2018-06-27 DIAGNOSIS — M25611 Stiffness of right shoulder, not elsewhere classified: Secondary | ICD-10-CM | POA: Diagnosis not present

## 2018-07-26 DIAGNOSIS — M25611 Stiffness of right shoulder, not elsewhere classified: Secondary | ICD-10-CM | POA: Diagnosis not present

## 2018-08-06 DIAGNOSIS — M25611 Stiffness of right shoulder, not elsewhere classified: Secondary | ICD-10-CM | POA: Diagnosis not present

## 2018-08-23 DIAGNOSIS — M25611 Stiffness of right shoulder, not elsewhere classified: Secondary | ICD-10-CM | POA: Diagnosis not present

## 2018-09-05 DIAGNOSIS — M25611 Stiffness of right shoulder, not elsewhere classified: Secondary | ICD-10-CM | POA: Diagnosis not present

## 2018-10-23 ENCOUNTER — Encounter: Payer: Self-pay | Admitting: Family Medicine

## 2018-10-23 ENCOUNTER — Telehealth: Payer: Self-pay | Admitting: *Deleted

## 2018-10-23 ENCOUNTER — Ambulatory Visit: Payer: BLUE CROSS/BLUE SHIELD | Admitting: Family Medicine

## 2018-10-23 VITALS — BP 118/82 | HR 47 | Temp 97.6°F | Ht 68.5 in | Wt 186.8 lb

## 2018-10-23 DIAGNOSIS — H8111 Benign paroxysmal vertigo, right ear: Secondary | ICD-10-CM | POA: Diagnosis not present

## 2018-10-23 NOTE — Progress Notes (Signed)
Subjective:    Patient ID: Derrick Horn, male    DOB: 12/26/1960, 57 y.o.   MRN: [id number]  HPI This is a 57 yo male who presents today with dizziness that started this morning. Shortly after awakening this morning had a sensation of spinning. Went back to sleep and sensation not as strong. Headache at temples. A little pressure at back of neck. Took 1/2 Advil cold and sinus earlier today- no relief. Took two baby aspirin.Has felt well over last couple of days. Intermittent nausea and sensation of spinning throughout the day. One episode of blurred vision and light sensitivity. Longstanding tinnitus, a little louder than usual.  Has felt otherwise normal in last couple of days.   Past Medical History:  Diagnosis Date  . Chronic LBP   . COPD (chronic obstructive pulmonary disease) ([place])    minimal emphysema on CT scan  . GERD (gastroesophageal reflux disease)    mild  . History of chest pain 2010   stress test WNL  . HLD (hyperlipidemia)    mild  . Recurrent sinus infections   . Sensorineural hearing loss (SNHL) of both ears 12/2015   mild-mod, no need for aides, rec melatoonin and lipoflavonoid [facility])  . Tear of left biceps muscle 01/19/2014   Past Surgical History:  Procedure Laterality Date  . BICEPS TENDON REPAIR  2015   [name]  . CARPAL TUNNEL RELEASE     right  . chest CT  2011   no pulm nodules, mild apical scarring/emphysematous changes  . COLONOSCOPY  09/2010   2 hyperplastic polyps [name])  . ETT  2010   WNL (Dr. Johnsie Cancel)  . GREEN LIGHT LASER TURP (TRANSURETHRAL RESECTION OF PROSTATE N/A 05/30/2016   Procedure: GREEN LIGHT LASER TURP (TRANSURETHRAL RESECTION OF PROSTATE;  Surgeon: Royston Cowper, MD;  Location: [facility] ORS;  Service: Urology;  Laterality: N/A;  . SHOULDER ARTHROSCOPY WITH SUBACROMIAL DECOMPRESSION, ROTATOR CUFF REPAIR AND BICEP TENDON REPAIR  04/2018   Dr Onnie Graham   Family History  Problem Relation Age of Onset  . Coronary artery disease  Maternal Grandfather 35  . Stroke Maternal Grandfather   . Coronary artery disease Father 21       s/p stents, CABG  . Cancer Maternal Grandmother        uterine  . Diabetes Neg Hx    Social History   Tobacco Use  . Smoking status: Former Smoker    Packs/day: 0.50    Years: 30.00    Pack years: 15.00    Types: Cigarettes  . Smokeless tobacco: Never Used  Substance Use Topics  . Alcohol use: No    Alcohol/week: 0.0 standard drinks  . Drug use: No      Review of Systems Per HPI    Objective:   Physical Exam  Constitutional: He is oriented to person, place, and time. He appears well-developed and well-nourished. No distress.  HENT:  Head: Normocephalic and atraumatic.  Nose: Nose normal.  Mouth/Throat: Oropharynx is clear and moist.  Eyes: Pupils are equal, round, and reactive to light. Conjunctivae and EOM are normal.  Neck: Normal range of motion. Neck supple.  Cardiovascular: Normal rate, regular rhythm and normal heart sounds.  Pulmonary/Chest: Effort normal and breath sounds normal.  Lymphadenopathy:    He has no cervical adenopathy.  Neurological: He is alert and oriented to person, place, and time. He displays normal reflexes. He exhibits normal muscle tone. Coordination normal.  Cranial nerves: CN I: not tested CN  II: pupils equal, round and reactive to light, visual fields intact, fundi unremarkable, without vessel changes, exudates, hemorrhages or papilledema. CN III, IV, VI: full range of motion, no nystagmus, no ptosis CN V: facial sensation intact CN VII: upper and lower face symmetric CN VIII: hearing intact CN IX, X: gag intact, uvula midline CN XI: sternocleidomastoid and trapezius muscles intact CN XII: tongue midline Bulk & Tone: normal, no fasciculations. Motor: 5/5 throughout. Deep Tendon Reflexes: 2+ throughout  Finger to nose testing: Without dysmetria.  Heel to shin: Without dysmetria.  Gait: Normal station and stride. Able to  turn and tandem walk. Romberg negative.   Skin: Skin is warm and dry. He is not diaphoretic.  Vitals reviewed.  Dick hallpike maneuver with reproduction of symptoms to right. No nystagmus.    BP 118/82   Pulse (!) 47   Temp 97.6 F (36.4 C) (Oral)   Ht 5' 8.5" (1.74 m)   Wt 186 lb 12 oz (84.7 kg)   SpO2 98%   BMI 27.98 kg/m  Wt Readings from Last 3 Encounters:  10/23/18 186 lb 12 oz (84.7 kg)  03/06/17 186 lb 3.2 oz (84.5 kg)  07/19/16 180 lb 12.8 oz (82 kg)    Visual Acuity Screening   Right eye Left eye Both eyes  Without correction: 20/20 20/25 20/15   With correction:          Assessment & Plan:  Discussed with Dr. Danise Mina 1. Benign paroxysmal positional vertigo of right ear - no abnormal findings on exam -Provided written and verbal information regarding diagnosis and treatment. - Strict RTC precautions reviewed   Clarene Reamer, FNP-BC  [facility], [place]  10/23/2018 5:40 PM

## 2018-10-23 NOTE — Telephone Encounter (Signed)
pts wife contacted office indicating pt having dizziness as of this am. Pt has had some blurred vision and "ran into the blinds." He denies any shortness of breath or inability to speak. Confirms loss of balance in conjunction with dizziness. Pt advised no available appts today; advised to go to the ED/ UC.

## 2018-10-23 NOTE — Telephone Encounter (Signed)
Noted  

## 2018-10-23 NOTE — Patient Instructions (Addendum)
Good to see you today Symptoms should decrease over time, if they are persistant or worsen in any way, please call the office or go to ER  Benign Positional Vertigo Vertigo is the feeling that you or your surroundings are moving when they are not. Benign positional vertigo is the most common form of vertigo. The cause of this condition is not serious (is benign). This condition is triggered by certain movements and positions (is positional). This condition can be dangerous if it occurs while you are doing something that could endanger you or others, such as driving. What are the causes? In many cases, the cause of this condition is not known. It may be caused by a disturbance in an area of the inner ear that helps your brain to sense movement and balance. This disturbance can be caused by a viral infection (labyrinthitis), head injury, or repetitive motion. What increases the risk? This condition is more likely to develop in:  Women.  People who are 38 years of age or older.  What are the signs or symptoms? Symptoms of this condition usually happen when you move your head or your eyes in different directions. Symptoms may start suddenly, and they usually last for less than a minute. Symptoms may include:  Loss of balance and falling.  Feeling like you are spinning or moving.  Feeling like your surroundings are spinning or moving.  Nausea and vomiting.  Blurred vision.  Dizziness.  Involuntary eye movement (nystagmus).  Symptoms can be mild and cause only slight annoyance, or they can be severe and interfere with daily life. Episodes of benign positional vertigo may return (recur) over time, and they may be triggered by certain movements. Symptoms may improve over time. How is this diagnosed? This condition is usually diagnosed by medical history and a physical exam of the head, neck, and ears. You may be referred to a health care provider who specializes in ear, nose, and throat  (ENT) problems (otolaryngologist) or a provider who specializes in disorders of the nervous system (neurologist). You may have additional testing, including:  MRI.  A CT scan.  Eye movement tests. Your health care provider may ask you to change positions quickly while he or she watches you for symptoms of benign positional vertigo, such as nystagmus. Eye movement may be tested with an electronystagmogram (ENG), caloric stimulation, the Dix-Hallpike test, or the roll test.  An electroencephalogram (EEG). This records electrical activity in your brain.  Hearing tests.  How is this treated? Usually, your health care provider will treat this by moving your head in specific positions to adjust your inner ear back to normal. Surgery may be needed in severe cases, but this is rare. In some cases, benign positional vertigo may resolve on its own in 2-4 weeks. Follow these instructions at home: Safety  Move slowly.Avoid sudden body or head movements.  Avoid driving.  Avoid operating heavy machinery.  Avoid doing any tasks that would be dangerous to you or others if a vertigo episode would occur.  If you have trouble walking or keeping your balance, try using a cane for stability. If you feel dizzy or unstable, sit down right away.  Return to your normal activities as told by your health care provider. Ask your health care provider what activities are safe for you. General instructions  Take over-the-counter and prescription medicines only as told by your health care provider.  Avoid certain positions or movements as told by your health care provider.  Drink enough  fluid to keep your urine clear or pale yellow.  Keep all follow-up visits as told by your health care provider. This is important. Contact a health care provider if:  You have a fever.  Your condition gets worse or you develop new symptoms.  Your family or friends notice any behavioral changes.  Your nausea or vomiting  gets worse.  You have numbness or a "pins and needles" sensation. Get help right away if:  You have difficulty speaking or moving.  You are always dizzy.  You faint.  You develop severe headaches.  You have weakness in your legs or arms.  You have changes in your hearing or vision.  You develop a stiff neck.  You develop sensitivity to light. This information is not intended to replace advice given to you by your health care provider. Make sure you discuss any questions you have with your health care provider. Document Released: 09/04/2006 Document Revised: 05/04/2016 Document Reviewed: 03/22/2015 Elsevier Interactive Patient Education  [employer].

## 2018-10-23 NOTE — Telephone Encounter (Signed)
Pt wife said he will not go to ED or UC; pt thinks may be equilibrium issue or something like that; explained new onset of dizziness, blurred vision and being off balance could indicate other possible health issues including stroke. Symptoms started earlier today. Pt is on no medications and no hx of heart issues, CVA or diabetes. Pt still will not go to ED or UC. I spoke with Glenda Chroman FNP and she will see pt today at 4:00 PM. pts wife appreciative and was advised if pt condition worsens prior to appt pt should go to ED. pts wife voiced understanding and will be at [facility] at 3:50 for ck in.FYI to Glenda Chroman FNP.

## 2018-11-16 ENCOUNTER — Encounter: Payer: Self-pay | Admitting: Family Medicine

## 2018-11-16 NOTE — Progress Notes (Addendum)
BP 120/70 (BP Location: Left Arm, Patient Position: Sitting, Cuff Size: Normal)   Pulse (!) 51   Temp 97.9 F (36.6 C) (Oral)   Ht 5' 8.5" (1.74 m)   Wt 186 lb (84.4 kg)   SpO2 96%   BMI 27.87 kg/m    CC: memory concerns "something's not right" Subjective:    Patient ID: Derrick Horn, male    DOB: [birth year], 57 y.o.   MRN: [id number]  HPI: Derrick Horn is a 57 y.o. male presenting on 11/18/2018 for Memory Loss (C/o short-term memory issues for about 1 mo. No problems with long-term memories. Pt is accompanied by his wife, Derrick Horn. ) and Tinnitus (C/o that ringing in ears is worse. )   Vertigo 1 month ago dx BPPV. This lasted 2.5 days.  Noticing multiple concerning findings with memory - forgets when he turns on water, forgot address this morning, drove back from airport and arrived at wrong house, short term memory loss noted. "not functioning right".  Continues working, just returned from trip to [place], [place]. Unable to fix thinks he normal has trouble doing.   Off and on tinnitus, much worse since episode of vertigo last month. constant ringing.   No fevers/chills, vision changes, headache, neck pain. Peripheral field sometimes gets cloudy.   Started using focus factor supplement.  No alcohol. Cutting down on smoking. Quit 3 days ago. Prior a few cigarettes/day. Started nicotine patches.   Elevated PSA - prostate biopsy was normal per patient. Had ?Greenlight laser treatment Eliberto Ivory) 2017. Was under GETA for prolonged period of time, difficult awakening. Chronic urethral pain since procedure. This did help urinary flow, but notes urgency. Bad experience.   Relevant past medical, surgical, family and social history reviewed and updated as indicated. Interim medical history since our last visit reviewed. Allergies and medications reviewed and updated. No outpatient medications prior to visit.   No facility-administered medications prior to visit.      Per HPI unless  specifically indicated in ROS section below Review of Systems     Objective:    BP 120/70 (BP Location: Left Arm, Patient Position: Sitting, Cuff Size: Normal)   Pulse (!) 51   Temp 97.9 F (36.6 C) (Oral)   Ht 5' 8.5" (1.74 m)   Wt 186 lb (84.4 kg)   SpO2 96%   BMI 27.87 kg/m   Wt Readings from Last 3 Encounters:  11/18/18 186 lb (84.4 kg)  10/23/18 186 lb 12 oz (84.7 kg)  03/06/17 186 lb 3.2 oz (84.5 kg)    Physical Exam  Constitutional: He appears well-developed and well-nourished. No distress.  HENT:  Head: Normocephalic and atraumatic.  Mouth/Throat: Oropharynx is clear and moist. No oropharyngeal exudate.  Eyes: Pupils are equal, round, and reactive to light. Conjunctivae and EOM are normal. No scleral icterus.  Neck: Normal range of motion. Neck supple. Carotid bruit is not present.  Cardiovascular: Normal rate, regular rhythm, normal heart sounds and intact distal pulses.  No murmur heard. Pulmonary/Chest: Breath sounds normal. No respiratory distress. He has no wheezes. He has no rales.  Neurological: He is alert. He has normal strength. No cranial nerve deficit or sensory deficit. He displays a negative Romberg sign. Coordination and gait normal.  CN 2-12 intact EOMI FTN and HTS intact No pronator drift No dysdiadokokinesia   Skin: Skin is warm and dry. No rash noted.  Vitals reviewed.  Lab Results  Component Value Date   CHOL 186 03/09/2016   HDL 45.00  03/09/2016   LDLCALC 123 (H) 03/09/2016   LDLDIRECT 137 06/03/2011   TRIG 92.0 03/09/2016   CHOLHDL 4 03/09/2016       Assessment & Plan:   Problem List Items Addressed This Visit    Vertigo    Episode lasted 2.5 days last month, ?central cause. See below.       Relevant Orders   MR Brain Wo Contrast   Tinnitus of both ears    Chronic, acutely worse this past month. Not pulsatile, no signs of neck artery bruits noted today.       Relevant Orders   MR Brain Wo Contrast   Memory loss - Primary     New, concerning after prolonged episode of vertigo 1 month ago. Will check labwork and brain imaging looking for signs of stroke. Discussed further stroke workup vs neuro referral pending initial results. In interim, rec start aspirin 81mg  daily. Pt and wife agree.       Relevant Orders   Lipid panel   Comprehensive metabolic panel   TSH   CBC with Differential/Platelet   Vitamin B12   RPR   MR Brain Wo Contrast   Elevated PSA    S/p benign prostate biopsy then greenfield laser treatment for BPH complicated by residual urethral pain.  Update PSA.       Relevant Orders   PSA       Meds ordered this encounter  Medications  . aspirin EC 81 MG tablet    Sig: Take 1 tablet (81 mg total) by mouth daily.   Orders Placed This Encounter  Procedures  . MR Brain Wo Contrast    Ht: 5'8 / wt: 186 / not claus / no metal removed from eyes from eyes by dr / no bullet wounds, shrapnel / no sx on brain heart eyes ears / no implanted devices / no needs / [employer] order/ miriam w pt    Standing Status:   Future    Standing Expiration Date:   01/20/2020    Order Specific Question:   ** REASON FOR EXAM (FREE TEXT)    Answer:   cognitive impairment, memory loss, tinnitus    Order Specific Question:   What is the patient's sedation requirement?    Answer:   No Sedation    Order Specific Question:   Does the patient have a pacemaker or implanted devices?    Answer:   No    Order Specific Question:   Preferred imaging location?    Answer:   [facility] (table limit-550lbs)    Order Specific Question:   Radiology Contrast Protocol - do NOT remove file path    Answer:   \\[email or web address]  . Lipid panel  . Comprehensive metabolic panel  . TSH  . CBC with Differential/Platelet  . PSA  . Vitamin B12  . RPR    Follow up plan: Return if symptoms worsen or fail to improve.  Ria Bush, MD

## 2018-11-18 ENCOUNTER — Ambulatory Visit: Payer: BLUE CROSS/BLUE SHIELD | Admitting: Family Medicine

## 2018-11-18 ENCOUNTER — Encounter: Payer: Self-pay | Admitting: Family Medicine

## 2018-11-18 ENCOUNTER — Other Ambulatory Visit: Payer: Self-pay | Admitting: Family Medicine

## 2018-11-18 VITALS — BP 120/70 | HR 51 | Temp 97.9°F | Ht 68.5 in | Wt 186.0 lb

## 2018-11-18 DIAGNOSIS — R972 Elevated prostate specific antigen [PSA]: Secondary | ICD-10-CM

## 2018-11-18 DIAGNOSIS — R42 Dizziness and giddiness: Secondary | ICD-10-CM | POA: Diagnosis not present

## 2018-11-18 DIAGNOSIS — R413 Other amnesia: Secondary | ICD-10-CM | POA: Diagnosis not present

## 2018-11-18 DIAGNOSIS — H9313 Tinnitus, bilateral: Secondary | ICD-10-CM | POA: Diagnosis not present

## 2018-11-18 LAB — LIPID PANEL
Cholesterol: 159 mg/dL (ref 0–200)
HDL: 44.2 mg/dL (ref >39.00)
LDL Cholesterol: 101 mg/dL — ABNORMAL HIGH (ref 0–99)
NonHDL: 114.34
Total CHOL/HDL Ratio: 4
Triglycerides: 68 mg/dL (ref 0.0–149.0)
VLDL: 13.6 mg/dL (ref 0.0–40.0)

## 2018-11-18 LAB — CBC WITH DIFFERENTIAL/PLATELET
Basophils Absolute: 0 10*3/uL (ref 0.0–0.1)
Basophils Relative: 0.5 % (ref 0.0–3.0)
Eosinophils Absolute: 0.3 10*3/uL (ref 0.0–0.7)
Eosinophils Relative: 5 % (ref 0.0–5.0)
HCT: 47.1 % (ref 39.0–52.0)
Hemoglobin: 16.2 g/dL (ref 13.0–17.0)
Lymphocytes Relative: 31.9 % (ref 12.0–46.0)
Lymphs Abs: 1.6 10*3/uL (ref 0.7–4.0)
MCHC: 34.3 g/dL (ref 30.0–36.0)
MCV: 90 fl (ref 78.0–100.0)
Monocytes Absolute: 0.4 10*3/uL (ref 0.1–1.0)
Monocytes Relative: 8.6 % (ref 3.0–12.0)
Neutro Abs: 2.8 10*3/uL (ref 1.4–7.7)
Neutrophils Relative %: 54 % (ref 43.0–77.0)
Platelets: 187 10*3/uL (ref 150.0–400.0)
RBC: 5.24 Mil/uL (ref 4.22–5.81)
RDW: 13.9 % (ref 11.5–15.5)
WBC: 5.1 10*3/uL (ref 4.0–10.5)

## 2018-11-18 LAB — COMPREHENSIVE METABOLIC PANEL
ALT: 16 U/L (ref 0–53)
AST: 15 U/L (ref 0–37)
Albumin: 4.4 g/dL (ref 3.5–5.2)
Alkaline Phosphatase: 73 U/L (ref 39–117)
BUN: 19 mg/dL (ref 6–23)
CO2: 27 mEq/L (ref 19–32)
Calcium: 9.4 mg/dL (ref 8.4–10.5)
Chloride: 106 mEq/L (ref 96–112)
Creatinine, Ser: 0.98 mg/dL (ref 0.40–1.50)
GFR: 83.36 mL/min (ref >60.00)
Glucose, Bld: 102 mg/dL — ABNORMAL HIGH (ref 70–99)
Potassium: 3.9 mEq/L (ref 3.5–5.1)
Sodium: 140 mEq/L (ref 135–145)
Total Bilirubin: 1 mg/dL (ref 0.2–1.2)
Total Protein: 6.7 g/dL (ref 6.0–8.3)

## 2018-11-18 LAB — TSH: TSH: 2.31 u[IU]/mL (ref 0.35–4.50)

## 2018-11-18 LAB — VITAMIN B12: Vitamin B-12: 196 pg/mL — ABNORMAL LOW (ref 211–911)

## 2018-11-18 LAB — PSA: PSA: 2.01 ng/mL (ref 0.10–4.00)

## 2018-11-18 MED ORDER — ASPIRIN EC 81 MG PO TBEC: 81.0000 mg | DELAYED_RELEASE_TABLET | Freq: Every day | ORAL | Status: AC

## 2018-11-18 MED ORDER — B-12 1000 MCG SL SUBL: 1.0000 | SUBLINGUAL_TABLET | Freq: Every day | SUBLINGUAL | Status: DC

## 2018-11-18 NOTE — Assessment & Plan Note (Signed)
Chronic, acutely worse this past month. Not pulsatile, no signs of neck artery bruits noted today.

## 2018-11-18 NOTE — Assessment & Plan Note (Addendum)
S/p benign prostate biopsy then greenfield laser treatment for BPH complicated by residual urethral pain.  Update PSA.

## 2018-11-18 NOTE — Assessment & Plan Note (Signed)
Episode lasted 2.5 days last month, ?central cause. See below.

## 2018-11-18 NOTE — Patient Instructions (Signed)
Labs today. I'd like to check MRI. See Rosaria Ferries to get this scheduled.  Start aspirin 81mg  daily. Good job with quitting smoking.

## 2018-11-18 NOTE — Assessment & Plan Note (Addendum)
New, concerning after prolonged episode of vertigo 1 month ago. Will check labwork and brain imaging looking for signs of stroke. Discussed further stroke workup vs neuro referral pending initial results. In interim, rec start aspirin 81mg  daily. Pt and wife agree.

## 2018-11-19 LAB — RPR: RPR Ser Ql: NONREACTIVE

## 2018-11-21 ENCOUNTER — Other Ambulatory Visit: Payer: Self-pay | Admitting: Family Medicine

## 2018-11-21 ENCOUNTER — Ambulatory Visit
Admission: RE | Admit: 2018-11-21 | Discharge: 2018-11-21 | Disposition: A | Discharge status: Home / Self Care | Payer: BLUE CROSS/BLUE SHIELD | Source: Ambulatory Visit | Attending: Family Medicine | Admitting: Family Medicine

## 2018-11-21 DIAGNOSIS — I679 Cerebrovascular disease, unspecified: Secondary | ICD-10-CM

## 2018-11-21 DIAGNOSIS — R413 Other amnesia: Secondary | ICD-10-CM

## 2018-11-21 DIAGNOSIS — R42 Dizziness and giddiness: Secondary | ICD-10-CM | POA: Diagnosis not present

## 2018-11-21 DIAGNOSIS — H9313 Tinnitus, bilateral: Secondary | ICD-10-CM

## 2018-11-22 ENCOUNTER — Encounter: Payer: Self-pay | Admitting: Family Medicine

## 2018-11-23 ENCOUNTER — Other Ambulatory Visit: Payer: BLUE CROSS/BLUE SHIELD

## 2018-11-24 ENCOUNTER — Other Ambulatory Visit: Payer: BLUE CROSS/BLUE SHIELD

## 2018-11-26 ENCOUNTER — Ambulatory Visit (INDEPENDENT_AMBULATORY_CARE_PROVIDER_SITE_OTHER): Payer: BLUE CROSS/BLUE SHIELD | Admitting: Neurology

## 2018-11-26 ENCOUNTER — Encounter: Payer: Self-pay | Admitting: Neurology

## 2018-11-26 ENCOUNTER — Telehealth: Payer: Self-pay | Admitting: Family Medicine

## 2018-11-26 VITALS — BP 113/72 | HR 51 | Ht 68.0 in | Wt 186.0 lb

## 2018-11-26 DIAGNOSIS — R0683 Snoring: Secondary | ICD-10-CM | POA: Diagnosis not present

## 2018-11-26 DIAGNOSIS — R0681 Apnea, not elsewhere classified: Secondary | ICD-10-CM

## 2018-11-26 DIAGNOSIS — R413 Other amnesia: Secondary | ICD-10-CM

## 2018-11-26 DIAGNOSIS — G4733 Obstructive sleep apnea (adult) (pediatric): Secondary | ICD-10-CM | POA: Insufficient documentation

## 2018-11-26 NOTE — Progress Notes (Signed)
[facility]    Provider:  Dr Derrick Horn Referring Provider: Ria Bush, MD Primary Care Physician:  Derrick Bush, MD  CC:  Memory loss  HPI:  Derrick Horn is a 57 y.o. male here as requested by Derrick Horn for cerebrovascular small vessel disease. PMHx LBP, decreased hearing, HLD, ex-smoker, oberweight, memory loss. He reports short-term memory changes in the last 3 weeks he was snoring so badly he went to another room. He had vertigo. Was diagnosed with BPPV.  He feels like he can't remember anything short term. He can't remember hs name. 20 years ago he had a "silent migraine" attack, his right side was buzzing, he was slurring. His wife is here and provides much information. 3 weeks ago he woke up, he got very dizzy, didn't feel well, the room was spinning. It has resolved. He feels his peripheral vision will get fuzzy and wavy. The snoring is severe, wife has to leave the room, she sleeps in the other room, he sleeps "horrible", he dozes off a lot.  He wakes up tired and dry mouth. No other focal neurologic deficits, associated symptoms, inciting events or modifiable factors.  Reviewed notes, labs and imaging from outside physicians, which showed:  Personally reviewed images and agree with the following:  1. Mild chronic small vessel disease without acute abnormality. 2. No age advanced or lobar specific atrophy pattern. Quantitative, volumetric MRI of the brain may be helpful for more specific evaluation for characteristic atrophy patterns associated with dementia.  Review Derrick Horn notes patient was last seen earlier this month for memory loss complaining of short-term memory issues for about 1 month no problems with long-term memories and ringing in the ears.  He was also diagnosed with benign positional vertigo which lasted 2-1/2 days.  Noticing multiple concerns for memory forgets when he turns on water, forgot address this morning, drove back from  airport and arrived the wrong house, short-term memory loss not functioning right.  No alcohol.  He recently quit smoking 3 days ago.  Review of Systems: Patient complains of symptoms per HPI as well as the following symptoms: memory loss. Pertinent negatives and positives per HPI. All others negative.   Social History   Socioeconomic History  . Marital status: Married    Spouse name: Not on file  . Number of children: 1  . Years of education: Not on file  . Highest education level: Not on file  Occupational History  . Occupation: Press photographer  Social Needs  . Financial resource strain: Not on file  . Food insecurity:    Worry: Not on file    Inability: Not on file  . Transportation needs:    Medical: Not on file    Non-medical: Not on file  Tobacco Use  . Smoking status: Former Smoker    Packs/day: 0.25    Years: 30.00    Pack years: 7.50    Types: Cigarettes    Last attempt to quit: 11/15/2018    Years since quitting: 0.0  . Smokeless tobacco: Never Used  Substance and Sexual Activity  . Alcohol use: No    Alcohol/week: 0.0 standard drinks  . Drug use: No  . Sexual activity: Not on file  Lifestyle  . Physical activity:    Days per week: Not on file    Minutes per session: Not on file  . Stress: Not on file  Relationships  . Social connections:    Talks on phone: Not on file  Gets together: Not on file    Attends religious service: Not on file    Active member of club or organization: Not on file    Attends meetings of clubs or organizations: Not on file    Relationship status: Not on file  . Intimate partner violence:    Fear of current or ex partner: Not on file    Emotionally abused: Not on file    Physically abused: Not on file    Forced sexual activity: Not on file  Other Topics Concern  . 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    1 dog   Activity: shooting, yardwork, no regular exercise   Diet: more salads, more oatmeal, good water    Family History  Problem Relation Age of Onset  . Coronary artery disease Maternal Grandfather 35  . Stroke Maternal Grandfather   . Coronary artery disease Father 46       s/p stents, CABG  . Cancer Maternal Grandmother        uterine  . Diabetes Neg Hx     Past Medical History:  Diagnosis Date  . Chronic LBP   . COPD (chronic obstructive pulmonary disease) ([place])    minimal emphysema on CT scan  . GERD (gastroesophageal reflux disease)    mild  . Headache    silent migraine attack age 66's  . History of chest pain 2010   stress test WNL  . HLD (hyperlipidemia)    mild  . Recurrent sinus infections   . Sensorineural hearing loss (SNHL) of both ears 12/2015   mild-mod, no need for aides, rec melatoonin and lipoflavonoid [facility])  . Tear of left biceps muscle 01/19/2014    Past Surgical History:  Procedure Laterality Date  . BICEPS TENDON REPAIR  2015   [name]  . CARPAL TUNNEL RELEASE     right  . chest CT  2011   no pulm nodules, mild apical scarring/emphysematous changes  . COLONOSCOPY  09/2010   2 hyperplastic polyps Derrick Horn)  . ETT  2010   WNL (Derrick. Johnsie Horn)  . GREEN LIGHT LASER TURP (TRANSURETHRAL RESECTION OF PROSTATE N/A 05/30/2016   Procedure: GREEN LIGHT LASER TURP (TRANSURETHRAL RESECTION OF PROSTATE;  Surgeon: Derrick Cowper, MD;  Location: [facility] ORS;  Service: Urology;  Laterality: N/A;  . SHOULDER ARTHROSCOPY WITH SUBACROMIAL DECOMPRESSION, ROTATOR CUFF REPAIR AND BICEP TENDON REPAIR  04/2018   Derrick Horn    Current Outpatient Medications  Medication Sig Dispense Refill  . aspirin EC 81 MG tablet Take 1 tablet (81 mg total) by mouth daily.    . Cyanocobalamin (B-12) 1000 MCG SUBL Place 1 tablet under the tongue daily. 30 each   . nicotine (NICODERM CQ - DOSED IN MG/24 HR) 7 mg/24hr patch Place 7 mg onto the skin daily.     No current  facility-administered medications for this visit.     Allergies as of 11/26/2018 - Review Complete 11/26/2018  Allergen Reaction Noted  . Azithromycin Other (See Comments) 09/22/2010  . Pyridium [phenazopyridine hcl]  11/18/2018    Vitals: BP 113/72   Pulse (!) 51   Ht 5\' 8"  (1.727 m)   Wt 186 lb (84.4 kg)   BMI 28.28 kg/m  Last Weight:  Wt Readings from Last 1 Encounters:  11/26/18 186 lb (84.4 kg)   Last Height:  Ht Readings from Last 1 Encounters:  11/26/18 5\' 8"  (1.727 m)   Physical exam: Exam: Gen: NAD, conversant, well nourised, obese, well groomed                     CV: RRR, no MRG. No Carotid Bruits. No peripheral edema, warm, nontender Eyes: Conjunctivae clear without exudates or hemorrhage  Neuro: Detailed Neurologic Exam  Speech:    Speech is normal; fluent and spontaneous with normal comprehension.  Cognition:  Montreal Cognitive Assessment  11/26/2018  Visuospatial/ Executive (0/5) 5  Naming (0/3) 3  Attention: Read list of digits (0/2) 2  Attention: Read list of letters (0/1) 1  Attention: Serial 7 subtraction starting at 100 (0/3) 3  Language: Repeat phrase (0/2) 1  Language : Fluency (0/1) 0  Abstraction (0/2) 2  Delayed Recall (0/5) 1  Orientation (0/6) 5  Total 23    Cranial Nerves:    The pupils are equal, round, and reactive to light. The fundi are normal and spontaneous venous pulsations are present. Visual fields are full to finger confrontation. Extraocular movements are intact. Trigeminal sensation is intact and the muscles of mastication are normal. The face is symmetric. The palate elevates in the midline. Hearing intact. Voice is normal. Shoulder shrug is normal. The tongue has normal motion without fasciculations.   Coordination:    Normal finger to nose and heel to shin. Normal rapid alternating movements.   Gait:    Heel-toe and tandem gait are normal.   Motor Observation:    No asymmetry, no atrophy, and no involuntary  movements noted. Tone:    Normal muscle tone.    Posture:    Posture is normal. normal erect    Strength:    Strength is V/V in the upper and lower limbs.      Sensation: intact to LT     Reflex Exam:  DTR's:    Deep tendon reflexes in the upper and lower extremities are brisk but symmetrical bilaterally.   Toes:    The toes are equivocal bilaterally.   Clonus:    Clonus is absent.       Assessment/Plan:  57 y.o. male here as requested by Derrick Horn for cerebrovascular small vessel disease and memory loss. PMHx LBP, decreased hearing, HLD, ex-smoker, oberweight, memory loss.   - MRI of the brain unremarkable, mild chronic small vessel disease but no signs of dementia-like changes on MRI brain. He is a -smoker. Discussed managing vascular risk factors.  - MoCA 23/30. This is unusual for his age. However he states he snores so heavily that his wife sleeps in another room. The snoring is severe, wife has to leave the room, she sleeps in the other room, he sleeps "horrible", he dozes off a lot.  He wakes up tired and dry mouth. May have sleep apnea. Will refer to sleep studies  - If he has sleep apnea needs to use cpap for 6 months. If after that time he still feels his memory is impaired needs to come back to clinic and we can repeat MoCA and if declined can send to formal neurocognitive testing  - tinnitus: recommend ENT and hearing testing. Can discuss [facility].   -  The snoring is severe, she has to leave the room, she sleeps in the other room, he sleeps "horrible", he dozes off a lot.   - B12 deficiency he is now on B12 supplement. B12 196.   Orders Placed This Encounter  Procedures  . Ambulatory referral to Sleep Studies     Sarina Ill, MD  [facility] [address] [place], [place] [address]  Phone [phone] Fax [phone]

## 2018-11-26 NOTE — Patient Instructions (Signed)
Follow up with sleep team If +sleep apnea need to use cpap for 4-6 months and if feel memory still impaired return to see me If sleep apnea negative, will refer for formal neurocognitive testing (formal memory testing) Continue B12 long term   Sleep Apnea Sleep apnea is a condition in which breathing pauses or becomes shallow during sleep. Episodes of sleep apnea usually last 10 seconds or longer, and they may occur as many as 20 times an hour. Sleep apnea disrupts your sleep and keeps your body from getting the rest that it needs. This condition can increase your risk of certain health problems, including:  Heart attack.  Stroke.  Obesity.  Diabetes.  Heart failure.  Irregular heartbeat.  There are three kinds of sleep apnea:  Obstructive sleep apnea. This kind is caused by a blocked or collapsed airway.  Central sleep apnea. This kind happens when the part of the brain that controls breathing does not send the correct signals to the muscles that control breathing.  Mixed sleep apnea. This is a combination of obstructive and central sleep apnea.  What are the causes? The most common cause of this condition is a collapsed or blocked airway. An airway can collapse or become blocked if:  Your throat muscles are abnormally relaxed.  Your tongue and tonsils are larger than normal.  You are overweight.  Your airway is smaller than normal.  What increases the risk? This condition is more likely to develop in people who:  Are overweight.  Smoke.  Have a smaller than normal airway.  Are elderly.  Are male.  Drink alcohol.  Take sedatives or tranquilizers.  Have a family history of sleep apnea.  What are the signs or symptoms? Symptoms of this condition include:  Trouble staying asleep.  Daytime sleepiness and tiredness.  Irritability.  Loud snoring.  Morning headaches.  Trouble concentrating.  Forgetfulness.  Decreased interest in  sex.  Unexplained sleepiness.  Mood swings.  Personality changes.  Feelings of depression.  Waking up often during the night to urinate.  Dry mouth.  Sore throat.  How is this diagnosed? This condition may be diagnosed with:  A medical history.  A physical exam.  A series of tests that are done while you are sleeping (sleep study). These tests are usually done in a sleep lab, but they may also be done at home.  How is this treated? Treatment for this condition aims to restore normal breathing and to ease symptoms during sleep. It may involve managing health issues that can affect breathing, such as high blood pressure or obesity. Treatment may include:  Sleeping on your side.  Using a decongestant if you have nasal congestion.  Avoiding the use of depressants, including alcohol, sedatives, and narcotics.  Losing weight if you are overweight.  Making changes to your diet.  Quitting smoking.  Using a device to open your airway while you sleep, such as: ? An oral appliance. This is a custom-made mouthpiece that shifts your lower jaw forward. ? A continuous positive airway pressure (CPAP) device. This device delivers oxygen to your airway through a mask. ? A nasal expiratory positive airway pressure (EPAP) device. This device has valves that you put into each nostril. ? A bi-level positive airway pressure (BPAP) device. This device delivers oxygen to your airway through a mask.  Surgery if other treatments do not work. During surgery, excess tissue is removed to create a wider airway.  It is important to get treatment for sleep apnea.  Without treatment, this condition can lead to:  High blood pressure.  Coronary artery disease.  (Men) An inability to achieve or maintain an erection (impotence).  Reduced thinking abilities.  Follow these instructions at home:  Make any lifestyle changes that your health care provider recommends.  Eat a healthy, well-balanced  diet.  Take over-the-counter and prescription medicines only as told by your health care provider.  Avoid using depressants, including alcohol, sedatives, and narcotics.  Take steps to lose weight if you are overweight.  If you were given a device to open your airway while you sleep, use it only as told by your health care provider.  Do not use any tobacco products, such as cigarettes, chewing tobacco, and e-cigarettes. If you need help quitting, ask your health care provider.  Keep all follow-up visits as told by your health care provider. This is important. Contact a health care provider if:  The device that you received to open your airway during sleep is uncomfortable or does not seem to be working.  Your symptoms do not improve.  Your symptoms get worse. Get help right away if:  You develop chest pain.  You develop shortness of breath.  You develop discomfort in your back, arms, or stomach.  You have trouble speaking.  You have weakness on one side of your body.  You have drooping in your face. These symptoms may represent a serious problem that is an emergency. Do not wait to see if the symptoms will go away. Get medical help right away. Call your local emergency services (911 in the [place] Do not drive yourself to the hospital. This information is not intended to replace advice given to you by your health care provider. Make sure you discuss any questions you have with your health care provider. Document Released: 11/17/2002 Document Revised: 07/23/2016 Document Reviewed: 09/06/2015 Elsevier Interactive Patient Education  [employer].

## 2018-11-26 NOTE — Telephone Encounter (Signed)
I can refer him to [facility] to see one of the sleep doctors who would then order sleep study, but may still not be able to get sleep study done until after Jan. Takes a while to get in to do these. Does he want [place] to try [facility]?

## 2018-11-26 NOTE — Progress Notes (Signed)
Epworth Sleepiness Scale 0= would never doze 1= slight chance of dozing 2= moderate chance of dozing 3= high chance of dozing  Sitting and reading: 0 Watching TV: 2 Sitting inactive in a public place (ex. Theater or meeting): 0 As a passenger in a car for an hour without a break: 0 Lying down to rest in the afternoon: 3 (pt reports evening, not afternoon) Sitting and talking to someone: 0 Sitting quietly after lunch (no alcohol): 0 In a car, while stopped in traffic: 0 Total: 5

## 2018-11-26 NOTE — Telephone Encounter (Signed)
Pt went to Derrick Horn and was told that he couldn't get his sleep study until 59months. Pt want to be referred to another sleep study place before end of Jan due to insurance. Please advise.

## 2018-11-27 NOTE — Telephone Encounter (Signed)
Spoke with pt's wife, Derrick Horn (on dpr), relaying Dr. Synthia Innocent message.  States [facility] called pt back and was able to get him in due to a cancellation. Derrick Horn expresses her thanks. Fyi to Dr. Darnell Level.

## 2018-11-28 ENCOUNTER — Encounter: Payer: Self-pay | Admitting: Neurology

## 2018-11-28 ENCOUNTER — Ambulatory Visit (INDEPENDENT_AMBULATORY_CARE_PROVIDER_SITE_OTHER): Payer: BLUE CROSS/BLUE SHIELD | Admitting: Neurology

## 2018-11-28 VITALS — BP 119/80 | HR 61 | Ht 68.0 in | Wt 186.0 lb

## 2018-11-28 DIAGNOSIS — G4730 Sleep apnea, unspecified: Secondary | ICD-10-CM | POA: Diagnosis not present

## 2018-11-28 DIAGNOSIS — R0683 Snoring: Secondary | ICD-10-CM | POA: Diagnosis not present

## 2018-11-28 DIAGNOSIS — Z72821 Inadequate sleep hygiene: Secondary | ICD-10-CM | POA: Diagnosis not present

## 2018-11-28 DIAGNOSIS — G4721 Circadian rhythm sleep disorder, delayed sleep phase type: Secondary | ICD-10-CM | POA: Diagnosis not present

## 2018-11-28 NOTE — Progress Notes (Addendum)
SLEEP MEDICINE CLINIC   Provider:  Larey Seat, MD   Primary Care Physician:  Ria Bush, MD   Referring Provider: Ria Bush, MD    Chief Complaint  Patient presents with  . New Patient (Initial Visit)    pt alone, rm 10, pt was seeing Dr Jaynee Eagles recently for memory issues and during the visit he talked about how he snores. His wife will sleep in a different room due to the snoring. he states at the end of the day he is exhausted and will pass out.     HPI:  Derrick Horn is a 57 y.o. male patient, seen here on 11/28/2018 in a referral  from Dr. Jaynee Eagles for a sleep evaluation.  He is neither excessively sleepy, not fatigued, but reports vertigo and short term memory problems. Had normal MRI with Dr. Jaynee Eagles, but abnormal MOCA. 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 Travels a lot, also through time zones. He is a" low grade smoker of 5-6 cig a day ". He is snoring so loudly that is spouse has left the bedroom.  Had a shoulder surgery hindered him to sleep on his right, he was more restless.  Chief complaint according to patient :He is neither excessively sleepy, not fatigued, but reports vertigo and short term memory problems. He hopes that sleep disturbances may be the cause and can be treated.   Sleep habits are as follows:  Skips breakfast. Supper time a when at home is around 7 PM, eats horrible- no exercise but may repair or tinker with the car or do yard work. Watches TV 8-10 PM and falls asleep in front of TV if he lays down, not while in seated position. Bedroom is cool, quiet and dark, he enters just before midnight, takes 20-30 minutes to fall asleep.  Mind is busy-  Sleeps on his right with multiple pillows, wakes at 3-4 AM for nocturia. Goes back to sleep for 2 hours, wakes up when wife wakes up at 6 AM. Wife leaves the house at 7.30 and he may be sleeping until 9 AM. Works from home office, in Press [employer].   Sleep medical history and family sleep  history: shoulder surgery 05-04-2018,  Prostrate surgery 2016, for "benign "hyperplasia- PSA stayed down.   Social history: married with one daughter , age 14, one grandchild. Low grade smoker, ETOH : used to drink at  business deals, company events, but not at home.  Caffeine 1 cup of coffee, than an energy bar, 8 ounces coke.     Review of Systems: Out of a complete 14 system review, the patient complains of only the following symptoms, and all other reviewed systems are negative. Reports Urination pain.  Possible ADD/ ADHD.  Sensitive stomach memory as in attention/ retention problems.    Epworth score 3/ 24 , Fatigue severity score 12/ 63  ,    Social History   Socioeconomic History  . Marital status: Married    Spouse name: Not on file  . Number of children: 1  . Years of education: Not on file  . Highest education level: Not on file  Occupational History  . Occupation: Press photographer  Social Needs  . Financial resource strain: Not on file  . Food insecurity:    Worry: Not on file    Inability: Not on file  . Transportation needs:    Medical: Not on file    Non-medical: Not on file  Tobacco Use  . Smoking  status: Former Smoker    Packs/day: 0.25    Years: 30.00    Pack years: 7.50    Types: Cigarettes    Last attempt to quit: 11/15/2018    Years since quitting: 0.0  . Smokeless tobacco: Never Used  Substance and Sexual Activity  . Alcohol use: No    Alcohol/week: 0.0 standard drinks  . Drug use: No  . Sexual activity: Not on file  Lifestyle  . Physical activity:    Days per week: Not on file    Minutes per session: Not on file  . Stress: Not on file  Relationships  . Social connections:    Talks on phone: Not on file    Gets together: Not on file    Attends religious service: Not on file    Active member of club or organization: Not on file    Attends meetings of clubs or organizations: Not on file    Relationship status: Not on file  . Intimate partner  violence:    Fear of current or ex partner: Not on file    Emotionally abused: Not on file    Physically abused: Not on file    Forced sexual activity: Not on file  Other Topics Concern  . 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   1 dog   Activity: shooting, yardwork, no regular exercise   Diet: more salads, more oatmeal, good water    Family History  Problem Relation Age of Onset  . Coronary artery disease Maternal Grandfather 35  . Stroke Maternal Grandfather   . Coronary artery disease Father 78       s/p stents, CABG  . Cancer Maternal Grandmother        uterine  . Diabetes Neg Hx     Past Medical History:  Diagnosis Date  . Chronic LBP   . COPD (chronic obstructive pulmonary disease) ([place])    minimal emphysema on CT scan  . GERD (gastroesophageal reflux disease)    mild  . Headache    silent migraine attack age 35's  . History of chest pain 2010   stress test WNL  . HLD (hyperlipidemia)    mild  . Recurrent sinus infections   . Sensorineural hearing loss (SNHL) of both ears 12/2015   mild-mod, no need for aides, rec melatoonin and lipoflavonoid [facility])  . Tear of left biceps muscle 01/19/2014    Past Surgical History:  Procedure Laterality Date  . BICEPS TENDON REPAIR  2015   [name]  . CARPAL TUNNEL RELEASE     right  . chest CT  2011   no pulm nodules, mild apical scarring/emphysematous changes  . COLONOSCOPY  09/2010   2 hyperplastic polyps [name])  . ETT  2010   WNL (Dr. Johnsie Cancel)  . GREEN LIGHT LASER TURP (TRANSURETHRAL RESECTION OF PROSTATE N/A 05/30/2016   Procedure: GREEN LIGHT LASER TURP (TRANSURETHRAL RESECTION OF PROSTATE;  Surgeon: Royston Cowper, MD;  Location: [facility] ORS;  Service: Urology;  Laterality: N/A;  . SHOULDER ARTHROSCOPY WITH SUBACROMIAL DECOMPRESSION, ROTATOR CUFF REPAIR AND BICEP TENDON REPAIR  04/2018    Dr Onnie Graham    Current Outpatient Medications  Medication Sig Dispense Refill  . aspirin EC 81 MG tablet Take 1 tablet (81 mg total) by mouth daily.    . Cyanocobalamin (B-12) 1000 MCG SUBL  Place 1 tablet under the tongue daily. 30 each   . nicotine (NICODERM CQ - DOSED IN MG/24 HR) 7 mg/24hr patch Place 7 mg onto the skin daily.     No current facility-administered medications for this visit.     Allergies as of 11/28/2018 - Review Complete 11/28/2018  Allergen Reaction Noted  . Azithromycin Other (See Comments) 09/22/2010  . Pyridium [phenazopyridine hcl]  11/18/2018    Vitals: BP 119/80   Pulse 61   Ht 5\' 8"  (1.727 m)   Wt 186 lb (84.4 kg)   BMI 28.28 kg/m  Last Weight:  Wt Readings from Last 1 Encounters:  11/28/18 186 lb (84.4 kg)   GBT:DVVO mass index is 28.28 kg/m.     Last Height:   Ht Readings from Last 1 Encounters:  11/28/18 5\' 8"  (1.727 m)    Physical exam:  General: The patient is awake, alert and appears not in acute distress. The patient is well groomed. Head: Normocephalic, atraumatic. Neck is supple. Mallampati 4 with a thick uvula - low palate. ,  neck circumference: 16" . Nasal airflow congestion, Retrognathia is not seen.  Cardiovascular:  Regular rate and rhythm, without  murmurs or carotid bruit, and without distended neck veins. Respiratory: Lungs are clear to auscultation. Skin:  Without evidence of edema, or rash Trunk: BMI is 28.2 . The patient's posture is erect.   Neurologic exam : The patient is awake and alert, oriented to place and time.   Memory testing revealed   [place]: Montreal Cognitive Assessment  11/26/2018  Visuospatial/ Executive (0/5) 5  Naming (0/3) 3  Attention: Read list of digits (0/2) 2  Attention: Read list of letters (0/1) 1  Attention: Serial 7 subtraction starting at 100 (0/3) 3  Language: Repeat phrase (0/2) 1  Language : Fluency (0/1) 0  Abstraction (0/2) 2  Delayed Recall (0/5) 1  Orientation (0/6) 5  Total  23   MMSE:No flowsheet data found.     Attention span & concentration ability appears normal.  Speech is fluent,  without dysarthria, dysphonia or aphasia.  Mood and affect are appropriate.  Cranial nerves: Pupils are equal and briskly reactive to light. Funduscopic exam deferred- Extraocular movements  in vertical and horizontal planes intact and without nystagmus. Visual fields by finger perimetry are intact. Hearing to finger rub intact.   Facial sensation intact to fine touch.  Facial motor strength is symmetric and tongue and uvula move midline. Shoulder shrug was symmetrical.   Motor exam:   Normal tone, muscle bulk and symmetric strength in all extremities.  Sensory:  Had some trouble with right shoulder, fingers will get numb, but not lasting.  Fine touch, pinprick and vibration were tested in all extremities. Proprioception tested in the upper extremities was normal.  Coordination: Rapid alternating movements in the fingers/hands are normal. No change in penmanship. Finger-to-nose maneuver  normal without evidence of ataxia, dysmetria or tremor. Gait and station: Patient walks without assistive device  Deep tendon reflexes: in the  upper and lower extremities are symmetric and intact.    Assessment:  After physical and neurologic examination, review of laboratory studies,  Personal review of imaging studies, reports of other /same  Imaging studies, results of polysomnography and / or neurophysiology testing and pre-existing records as far as provided in visit., my assessment is   1) The reported loud snoring and pauses in breathing, the non- refreshing quality of sleep, and the "morning daze " with a dry mouth would all indicate  sleep apnea, in spite of not reporting excessive daytime sleepiness.   The patient was advised of the nature of the diagnosed disorder , the treatment options and the  risks for general health and wellness arising from not treating the condition.   I  spent more than 45 minutes of face to face time with the patient.  Greater than 50% of time was spent in counseling and coordination of care. We have discussed the diagnosis and differential and I answered the patient's questions.    Plan:  Treatment plan and additional workup : we had a very long sleep hygiene discussion, he uses his cell pone in the bedroom, up to 2 AM on week ends, etc.  Gave him the 14 day boot camp.  Urged better routines and keeping them.   Will use a watch pat HST for screening or In lab test for evaluation.    Larey Seat, MD 77/41/4239, 5:32 AM  Certified in Neurology by ABPN Certified in [place] by [facility] [address] [place], [place] [address]

## 2018-11-29 ENCOUNTER — Telehealth: Payer: Self-pay | Admitting: Family Medicine

## 2018-11-29 DIAGNOSIS — H9313 Tinnitus, bilateral: Secondary | ICD-10-CM

## 2018-11-29 NOTE — Telephone Encounter (Signed)
Pt's wife is calling about getting a referral for her husband for his tinnitus problem at the [facility]. Please advise.

## 2018-11-30 NOTE — Telephone Encounter (Signed)
Referral placed ENT

## 2018-12-05 NOTE — Telephone Encounter (Signed)
Referral faxed to [facility], patient notified.

## 2018-12-11 DIAGNOSIS — J069 Acute upper respiratory infection, unspecified: Secondary | ICD-10-CM | POA: Diagnosis not present

## 2018-12-11 DIAGNOSIS — G4730 Sleep apnea, unspecified: Secondary | ICD-10-CM

## 2018-12-11 DIAGNOSIS — R05 Cough: Secondary | ICD-10-CM | POA: Diagnosis not present

## 2018-12-11 HISTORY — DX: Sleep apnea, unspecified: G47.30

## 2018-12-18 ENCOUNTER — Encounter: Payer: Self-pay | Admitting: Family Medicine

## 2018-12-18 ENCOUNTER — Ambulatory Visit (INDEPENDENT_AMBULATORY_CARE_PROVIDER_SITE_OTHER): Payer: BLUE CROSS/BLUE SHIELD | Admitting: Family Medicine

## 2018-12-18 VITALS — BP 136/90 | HR 53 | Temp 98.1°F | Ht 68.5 in | Wt 186.2 lb

## 2018-12-18 DIAGNOSIS — L299 Pruritus, unspecified: Secondary | ICD-10-CM | POA: Diagnosis not present

## 2018-12-18 DIAGNOSIS — R600 Localized edema: Secondary | ICD-10-CM

## 2018-12-18 LAB — COMPREHENSIVE METABOLIC PANEL
ALT: 21 U/L (ref 0–53)
AST: 15 U/L (ref 0–37)
Albumin: 4.4 g/dL (ref 3.5–5.2)
Alkaline Phosphatase: 81 U/L (ref 39–117)
BUN: 13 mg/dL (ref 6–23)
CO2: 30 mEq/L (ref 19–32)
Calcium: 10 mg/dL (ref 8.4–10.5)
Chloride: 103 mEq/L (ref 96–112)
Creatinine, Ser: 0.97 mg/dL (ref 0.40–1.50)
GFR: 84.33 mL/min (ref >60.00)
Glucose, Bld: 90 mg/dL (ref 70–99)
Potassium: 3.9 mEq/L (ref 3.5–5.1)
Sodium: 138 mEq/L (ref 135–145)
Total Bilirubin: 0.8 mg/dL (ref 0.2–1.2)
Total Protein: 7.6 g/dL (ref 6.0–8.3)

## 2018-12-18 LAB — CBC WITH DIFFERENTIAL/PLATELET
Basophils Absolute: 0 10*3/uL (ref 0.0–0.1)
Basophils Relative: 0.4 % (ref 0.0–3.0)
Eosinophils Absolute: 0.4 10*3/uL (ref 0.0–0.7)
Eosinophils Relative: 4.9 % (ref 0.0–5.0)
HCT: 49.1 % (ref 39.0–52.0)
Hemoglobin: 16.9 g/dL (ref 13.0–17.0)
Lymphocytes Relative: 28.5 % (ref 12.0–46.0)
Lymphs Abs: 2.3 10*3/uL (ref 0.7–4.0)
MCHC: 34.5 g/dL (ref 30.0–36.0)
MCV: 89.3 fl (ref 78.0–100.0)
Monocytes Absolute: 0.5 10*3/uL (ref 0.1–1.0)
Monocytes Relative: 6.6 % (ref 3.0–12.0)
Neutro Abs: 4.8 10*3/uL (ref 1.4–7.7)
Neutrophils Relative %: 59.6 % (ref 43.0–77.0)
Platelets: 258 10*3/uL (ref 150.0–400.0)
RBC: 5.5 Mil/uL (ref 4.22–5.81)
RDW: 13.7 % (ref 11.5–15.5)
WBC: 8.1 10*3/uL (ref 4.0–10.5)

## 2018-12-18 LAB — BRAIN NATRIURETIC PEPTIDE: Pro B Natriuretic peptide (BNP): 39 pg/mL (ref 0.0–100.0)

## 2018-12-18 NOTE — Progress Notes (Signed)
Subjective:    Patient ID: Derrick Horn, male    DOB: 01/30/1961, 58 y.o.   MRN: [id number]  HPI 58 yo pt of Dr Darnell Level here for swelling of feet   This has happened before  Flares with no rhyme or reason  (once related to a brand of black socks) Starts with itching- arch and ball of foot Then swelling  Feels funny to walk   Ate shrimp yesterday (was never allergic)  He avoids excessive sodium  L foot is better than right  Feet are generally sore   Took benadryl last night - that usually works but not so much this time  No h/o allergy to iodine  No hx of hives or rash   Just diagnosed with b12 def  Started oral B12     Wt Readings from Last 3 Encounters:  12/18/18 186 lb 4 oz (84.5 kg)  11/28/18 186 lb (84.4 kg)  11/26/18 186 lb (84.4 kg)   27.91 kg/m    BP Readings from Last 3 Encounters:  12/18/18 136/90  11/28/18 119/80  11/26/18 113/72   Pulse Readings from Last 3 Encounters:  12/18/18 (!) 53  11/28/18 61  11/26/18 (!) 51   Lab Results  Component Value Date   CREATININE 0.98 11/18/2018   BUN 19 11/18/2018   NA 140 11/18/2018   K 3.9 11/18/2018   CL 106 11/18/2018   CO2 27 11/18/2018   No hx of allergies No hx of anaphylaxis to anything   Just had uri -treated at [facility]  Took bronmphir- pseudoephedrine DM Last dose 1/2  Feeling better   No hx of CHF or heart problems   Patient Active Problem List   Diagnosis Date Noted  . Pedal edema 12/18/2018  . Itching 12/18/2018  . Snoring 11/26/2018  . Cerebrovascular small vessel disease 11/21/2018  . Memory loss 11/18/2018  . Tinnitus of both ears 11/18/2018  . Vertigo 11/18/2018  . Rotator cuff tear arthropathy, right 03/03/2018  . Elevated PSA 03/31/2016  . Decreased hearing of both ears 12/29/2015  . Dysphagia 12/29/2015  . Sensorineural hearing loss (SNHL) of both ears 12/12/2015  . Hyperglycemia 02/20/2013  . Healthcare maintenance 07/14/2011  . Dyslipidemia 07/14/2011  . LOW BACK PAIN,  CHRONIC 08/25/2010  . Overweight 08/04/2010  . Ex-smoker 08/04/2010  . GERD 08/04/2010  . CHEST PAIN, HX OF 10/19/2007   Past Medical History:  Diagnosis Date  . Chronic LBP   . COPD (chronic obstructive pulmonary disease) ([place])    minimal emphysema on CT scan  . GERD (gastroesophageal reflux disease)    mild  . Headache    silent migraine attack age 70's  . History of chest pain 2010   stress test WNL  . HLD (hyperlipidemia)    mild  . Recurrent sinus infections   . Sensorineural hearing loss (SNHL) of both ears 12/2015   mild-mod, no need for aides, rec melatoonin and lipoflavonoid [facility])  . Tear of left biceps muscle 01/19/2014   Past Surgical History:  Procedure Laterality Date  . BICEPS TENDON REPAIR  2015   [name]  . CARPAL TUNNEL RELEASE     right  . chest CT  2011   no pulm nodules, mild apical scarring/emphysematous changes  . COLONOSCOPY  09/2010   2 hyperplastic polyps [name])  . ETT  2010   WNL (Dr. Johnsie Cancel)  . GREEN LIGHT LASER TURP (TRANSURETHRAL RESECTION OF PROSTATE N/A 05/30/2016   Procedure: GREEN LIGHT LASER TURP (TRANSURETHRAL  RESECTION OF PROSTATE;  Surgeon: Royston Cowper, MD;  Location: [facility] ORS;  Service: Urology;  Laterality: N/A;  . SHOULDER ARTHROSCOPY WITH SUBACROMIAL DECOMPRESSION, ROTATOR CUFF REPAIR AND BICEP TENDON REPAIR  04/2018   Dr Onnie Graham   Social History   Tobacco Use  . Smoking status: Former Smoker    Packs/day: 0.25    Years: 30.00    Pack years: 7.50    Types: Cigarettes    Last attempt to quit: 11/15/2018    Years since quitting: 0.0  . Smokeless tobacco: Never Used  Substance Use Topics  . Alcohol use: No    Alcohol/week: 0.0 standard drinks  . Drug use: No   Family History  Problem Relation Age of Onset  . Coronary artery disease Maternal Grandfather 35  . Stroke Maternal Grandfather   . Coronary artery disease Father 23       s/p stents, CABG  . Cancer Maternal Grandmother        uterine  . Diabetes Neg Hx     Allergies  Allergen Reactions  . Azithromycin Other (See Comments)    bradycardia, hypotension,syncope, 3 days in hospital  . Pyridium [Phenazopyridine Hcl]     Nausea/vomiting   Current Outpatient Medications on File Prior to Visit  Medication Sig Dispense Refill  . aspirin EC 81 MG tablet Take 1 tablet (81 mg total) by mouth daily.    . Cyanocobalamin (B-12) 1000 MCG SUBL Place 1 tablet under the tongue daily. 30 each   . MAGNESIUM PO Take 1 tablet by mouth daily.    . Multiple Vitamin (MULTIVITAMIN) capsule Take 1 capsule by mouth daily.     No current facility-administered medications on file prior to visit.     Review of Systems  Constitutional: Positive for fatigue. Negative for activity change, appetite change, fever and unexpected weight change.  HENT: Positive for postnasal drip. Negative for congestion, facial swelling, hearing loss, rhinorrhea, sneezing, sore throat and trouble swallowing.        Recent uti is improved   Eyes: Negative for pain, redness, itching and visual disturbance.  Respiratory: Positive for cough. Negative for chest tightness, shortness of breath and wheezing.        Recent uri  Improved cough  Cardiovascular: Positive for leg swelling. Negative for chest pain and palpitations.       Foot swelling    Gastrointestinal: Negative for abdominal pain, blood in stool, constipation, diarrhea and nausea.  Endocrine: Negative for cold intolerance, heat intolerance, polydipsia and polyuria.  Genitourinary: Negative for difficulty urinating, dysuria, frequency and urgency.  Musculoskeletal: Negative for arthralgias, joint swelling and myalgias.  Skin: Negative for pallor and rash.  Neurological: Negative for dizziness, tremors, seizures, syncope, facial asymmetry, speech difficulty, weakness, light-headedness, numbness and headaches.  Hematological: Negative for adenopathy. Does not bruise/bleed easily.  Psychiatric/Behavioral: Negative for decreased  concentration and dysphoric mood. The patient is not nervous/anxious.        Short term memory and concentration problems lately        Objective:   Physical Exam Constitutional:      General: He is not in acute distress.    Appearance: Normal appearance. He is normal weight. He is not ill-appearing or diaphoretic.  HENT:     Head: Normocephalic and atraumatic.     Right Ear: Tympanic membrane and ear canal normal.     Left Ear: Tympanic membrane and ear canal normal.     Nose: Nose normal.     Mouth/Throat:  Mouth: Mucous membranes are moist.     Pharynx: Oropharynx is clear.     Comments: No mouth or throat swelling  Eyes:     General:        Right eye: No discharge.        Left eye: No discharge.     Extraocular Movements: Extraocular movements intact.     Conjunctiva/sclera: Conjunctivae normal.  Neck:     Musculoskeletal: Normal range of motion.  Cardiovascular:     Rate and Rhythm: Regular rhythm. Bradycardia present.     Pulses: Normal pulses.     Comments: Nl pedal pulses  Pulmonary:     Effort: Pulmonary effort is normal. No respiratory distress.     Breath sounds: Normal breath sounds. No stridor. No wheezing or rhonchi.  Abdominal:     General: Abdomen is flat. There is no distension.  Musculoskeletal: Normal range of motion.        General: No deformity.     Comments: Foot swelling (not pitting) bilateral worse in R dorsal foot  No tenderness No leg tenderness or palp cords  Neg homan's sign    Lymphadenopathy:     Cervical: No cervical adenopathy.  Skin:    General: Skin is warm and dry.     Capillary Refill: Capillary refill takes less than 2 seconds.     Findings: No bruising, erythema or rash.     Comments: Pink skin on feet w/o whelps No peeling or excoriation  No evidence of tinea   Neurological:     General: No focal deficit present.     Mental Status: He is alert.     Cranial Nerves: No cranial nerve deficit.     Sensory: No sensory  deficit.     Motor: No weakness.     Coordination: Coordination normal.     Deep Tendon Reflexes: Reflexes normal.  Psychiatric:        Mood and Affect: Mood normal.           Assessment & Plan:   Problem List Items Addressed This Visit      Other   Pedal edema - Primary    Not pitting  Assoc with flares of itching but no whelps  This episode did follow shrimp intake  More symptomatic in R foot today  Some improvement with 1 benadryl last night  No other symptoms of angioedema   Lab today for cmet and cbc and BMP  Trial of zyrtec  Will notify PCP  ? If will need allergy ref       Relevant Orders   CBC with Differential/Platelet (Completed)   Comprehensive metabolic panel (Completed)   Brain natriuretic peptide (Completed)   Itching    Of feet with swelling Intermittent flares -usually improved with benadryl  May need allergy eval in the future Recently ate shrimp (no prior rxn but did react to a specific pr of socks in the past)  Adv to get zyrtec 10 mg daily and update if worse or no imp        Relevant Orders   Comprehensive metabolic panel (Completed)

## 2018-12-18 NOTE — Assessment & Plan Note (Signed)
Not pitting  Assoc with flares of itching but no whelps  This episode did follow shrimp intake  More symptomatic in R foot today  Some improvement with 1 benadryl last night  No other symptoms of angioedema   Lab today for cmet and cbc and BMP  Trial of zyrtec  Will notify PCP  ? If will need allergy ref

## 2018-12-18 NOTE — Assessment & Plan Note (Signed)
Of feet with swelling Intermittent flares -usually improved with benadryl  May need allergy eval in the future Recently ate shrimp (no prior rxn but did react to a specific pr of socks in the past)  Adv to get zyrtec 10 mg daily and update if worse or no imp

## 2018-12-18 NOTE — Patient Instructions (Signed)
Elevate feet and keep cool   Get zyrtec 10 mg and take one daily  Avoid other over the counter medicines   Labs today   Keep a journal of food or new exposures to track symptoms   Alert [place] if symptoms worsen  If any shortness of breath or throat swelling-get to the ER

## 2018-12-19 DIAGNOSIS — H9313 Tinnitus, bilateral: Secondary | ICD-10-CM | POA: Diagnosis not present

## 2018-12-19 DIAGNOSIS — H903 Sensorineural hearing loss, bilateral: Secondary | ICD-10-CM | POA: Diagnosis not present

## 2018-12-19 DIAGNOSIS — R42 Dizziness and giddiness: Secondary | ICD-10-CM | POA: Diagnosis not present

## 2018-12-24 ENCOUNTER — Telehealth: Payer: Self-pay

## 2018-12-24 NOTE — Telephone Encounter (Signed)
Pt called back; pt said could not see lab results from 12/18/18. Pt went to my chart and found comments. Pt took one Zyrtec and the swelling and itching went down in feet. Pt did not take anymore Zyrtec because did not like the way it made pt feel. Zyrtec made pt feel "weird". Zyrtec caused nausea and made him feel goofy. pts stomach is very sensitive. Pt also wants to know if got results from [facility] about his hearing. Pt is not sure what all the hearing test results meant. Fwd to Dr Glori Bickers and Dr Danise Mina. Pt request cb about [facility] results.

## 2018-12-24 NOTE — Telephone Encounter (Signed)
I saw him for the foot swelling and itching (I am out of the office today) - if zyrtec helped then it does sound allergic Since he did not tolerate it well- can try either claritin 10 mg daily or allegra 180 mg daily if needed and see if he does better  F/u with PCP

## 2018-12-25 ENCOUNTER — Ambulatory Visit (INDEPENDENT_AMBULATORY_CARE_PROVIDER_SITE_OTHER): Payer: BLUE CROSS/BLUE SHIELD | Admitting: Neurology

## 2018-12-25 DIAGNOSIS — Z72821 Inadequate sleep hygiene: Secondary | ICD-10-CM

## 2018-12-25 DIAGNOSIS — G4721 Circadian rhythm sleep disorder, delayed sleep phase type: Secondary | ICD-10-CM

## 2018-12-25 DIAGNOSIS — G4733 Obstructive sleep apnea (adult) (pediatric): Secondary | ICD-10-CM

## 2018-12-25 DIAGNOSIS — R0683 Snoring: Secondary | ICD-10-CM

## 2018-12-25 DIAGNOSIS — G4730 Sleep apnea, unspecified: Secondary | ICD-10-CM

## 2018-12-25 NOTE — Telephone Encounter (Signed)
I can pull up [facility] evaluation - audiogram showed bilateral high frequency hearing loss - ringing in ears likely from hearing loss. ENT PA did not think he needed hearing aides at this time yet however.  They offered formal audiology evaluation but pt declined. They recommended trial of white noise machine at home for tinnitus.  They recommended continued hearing protection when exposed to loud noises.  Overall ok evaluation - did have hearing loss but to a mild degree.

## 2018-12-25 NOTE — Telephone Encounter (Signed)
Spoke with pt relaying message per Dr. Darnell Level.  Pt verbalizes understanding. Says he has sleep study scheduled next.

## 2019-01-06 ENCOUNTER — Telehealth: Payer: Self-pay | Admitting: Neurology

## 2019-01-06 NOTE — Addendum Note (Signed)
Addended by: Larey Seat on: 01/06/2019 12:49 PM   Modules accepted: Orders

## 2019-01-06 NOTE — Telephone Encounter (Signed)
-----   Message from Larey Seat, MD sent at 01/06/2019 12:49 PM EST ----- IMPRESSION: Moderate- Severe sleep apnea at AHI 27.5/h, OSA  without REM accentuation and without hypoxemia. Moderate snoring.  Sinus Bradycardia.  RECOMMENDATION: The patient can be treated with CPAP or a dental  device, given the lack of hypoxemia.  Sleep apnea may have contributed to some memory lapses, but  hypoxemia was not seen.  In order to initiate fast treatment , I will order an  autotitration- CPAP , mask of patient's choice and heated  humidity between 5 and 15 cm water with 2 cm EPR.

## 2019-01-06 NOTE — Telephone Encounter (Signed)
I called pt. I advised pt that Dr. Brett Fairy reviewed their sleep study results and found that pt has moderate to severe sleep apnea. Dr. Brett Fairy  recommends that pt starts auto CPAP or a dental device.I reviewed both devices with the patient and he questions the cost. I reviewed PAP compliance expectations with the pt. Pt is agreeable to starting a CPAP. I advised pt that an order will be sent to a DME, [facility], and [facility] will call the pt within about one week after they file with the pt's insurance. [facility] will show the pt how to use the machine, fit for masks, and troubleshoot the CPAP if needed.Advised the patient that a follow up apt within 31-90 after starting the machine is required for insurance purposes. Patient would like to weigh out both options and I informed him to call me back if he starts the auto CPAP. Pt verbalized understanding of results. Pt had no questions at this time but was encouraged to call back if questions arise. I have sent the order to [facility] and have received confirmation that they have received the order.

## 2019-01-06 NOTE — Procedures (Signed)
[facility] [address] [place], [place] [address] NAME:  Derrick Horn                                                                          DOB: Oct 28, 1961 MEDICAL RECORD No: [id number]                                                           DOS: 12/25/2018 REFERRING PHYSICIAN: Sarina Ill, MD STUDY PERFORMED: Home Sleep Test on Watch Pat HISTORY: Derrick Horn is a 58 y.o. male patient, seen here on 11/28/2018 in a referral from Dr. Jaynee Eagles for a sleep evaluation.  He is neither excessively sleepy, not fatigued, but reports vertigo and short term memory problems. Had normal MRI with Dr. Jaynee Eagles, but abnormal MOCA. 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 Travels a lot, also through time zones. He is a" low grade smoker of 5-6 cig a day ". He is snoring so loudly that is spouse has left the bedroom.  Had a shoulder surgery hindered him to sleep on his right, he was more restless with shoulder pain.  Epworth score 3/ 24 points, Fatigue severity score 12/ 63 points. BMI is 28.1 kg/m2.   STUDY RESULTS:  Total Recording Time: 9 h, 32 mins; Total Sleep Time:  8 h 32 mins. Total Apnea/Hypopnea Index (AHI):   27.5/h; RDI: 30.3 /h; REM AHI: 25.7 /h Average Oxygen Saturation:  94 %; Lowest Oxygen Desaturation: 90 %  Total Time in Oxygen Saturation Below 89 %:  0.0 minutes  Average Heart Rate:  47 bpm- varied between 39 and 82 bpm. Sinus Bradycardia.   IMPRESSION: Moderate- Severe sleep apnea at AHI 27.5/h, OSA without REM accentuation and without hypoxemia. Moderate snoring. Sinus Bradycardia.   RECOMMENDATION: The patient can be treated with CPAP or a dental device, given the lack of hypoxemia.  Sleep apnea may have contributed to some memory lapses, but hypoxemia was not seen.  In order to initiate fast treatment , I will order an autotitration- CPAP , mask of patient's choice and heated humidity between 5 and 15 cm water with 2 cm EPR.   I certify that I have reviewed the raw data recording prior to the issuance of this report in accordance with the standards of the American Academy of Sleep Medicine (AASM). Larey Seat, M.D.    01-04-2019   Medical Director of [facility] at [facility], accredited by the AASM. Diplomat of the ABPN and ABSM.

## 2019-01-09 DIAGNOSIS — G4733 Obstructive sleep apnea (adult) (pediatric): Secondary | ICD-10-CM | POA: Diagnosis not present

## 2019-01-19 ENCOUNTER — Encounter: Payer: Self-pay | Admitting: Family Medicine

## 2019-02-08 DIAGNOSIS — G4733 Obstructive sleep apnea (adult) (pediatric): Secondary | ICD-10-CM | POA: Diagnosis not present

## 2019-03-10 DIAGNOSIS — G4733 Obstructive sleep apnea (adult) (pediatric): Secondary | ICD-10-CM | POA: Diagnosis not present

## 2019-04-10 DIAGNOSIS — G4733 Obstructive sleep apnea (adult) (pediatric): Secondary | ICD-10-CM | POA: Diagnosis not present

## 2019-05-28 DIAGNOSIS — G4733 Obstructive sleep apnea (adult) (pediatric): Secondary | ICD-10-CM | POA: Diagnosis not present

## 2020-03-15 ENCOUNTER — Ambulatory Visit: Payer: BC Managed Care – PPO

## 2020-03-20 ENCOUNTER — Ambulatory Visit: Payer: BC Managed Care – PPO | Attending: Internal Medicine

## 2020-03-20 DIAGNOSIS — Z23 Encounter for immunization: Secondary | ICD-10-CM

## 2020-03-20 NOTE — Progress Notes (Signed)
   Covid-19 Vaccination Clinic  Name:  Derrick Horn    MRN: [id number] DOB: December 07, 1961  03/20/2020  Mr. Derrick Horn was observed post Covid-19 immunization for 15 minutes without incident. He was provided with Vaccine Information Sheet and instruction to access the V-Safe system.   Mr. Derrick Horn was instructed to call 911 with any severe reactions post vaccine: Marland Kitchen Difficulty breathing  . Swelling of face and throat  . A fast heartbeat  . A bad rash all over body  . Dizziness and weakness   Immunizations Administered    Name Date Dose VIS Date Route   Pfizer COVID-19 Vaccine 03/20/2020  8:52 AM 0.3 mL 11/21/2019 Intramuscular   Manufacturer: [place]   Lot: [id number]   [place]: [id number]

## 2020-04-14 ENCOUNTER — Ambulatory Visit: Payer: BC Managed Care – PPO

## 2020-04-30 ENCOUNTER — Ambulatory Visit: Payer: BC Managed Care – PPO | Attending: Internal Medicine

## 2020-04-30 DIAGNOSIS — Z23 Encounter for immunization: Secondary | ICD-10-CM

## 2020-04-30 NOTE — Progress Notes (Signed)
   Covid-19 Vaccination Clinic  Name:  MEGA BORGEN    MRN: [id number] DOB: 1961-11-08  04/30/2020  Mr. Para was observed post Covid-19 immunization for 15 minutes without incident. He was provided with Vaccine Information Sheet and instruction to access the V-Safe system.   Mr. Pretzer was instructed to call 911 with any severe reactions post vaccine: Marland Kitchen Difficulty breathing  . Swelling of face and throat  . A fast heartbeat  . A bad rash all over body  . Dizziness and weakness   Immunizations Administered    Name Date Dose VIS Date Route   Pfizer COVID-19 Vaccine 04/30/2020 11:08 AM 0.3 mL 02/04/2019 Intramuscular   Manufacturer: [place]   Lot: [id number]   [place]: [id number]

## 2020-06-19 ENCOUNTER — Emergency Department: Payer: BC Managed Care – PPO

## 2020-06-19 ENCOUNTER — Encounter: Payer: Self-pay | Admitting: Emergency Medicine

## 2020-06-19 ENCOUNTER — Emergency Department
Admission: EM | Admit: 2020-06-19 | Discharge: 2020-06-19 | Disposition: A | Discharge status: Home / Self Care | Payer: BC Managed Care – PPO | Attending: Emergency Medicine | Admitting: Emergency Medicine

## 2020-06-19 ENCOUNTER — Other Ambulatory Visit: Payer: Self-pay

## 2020-06-19 DIAGNOSIS — Z7982 Long term (current) use of aspirin: Secondary | ICD-10-CM | POA: Diagnosis not present

## 2020-06-19 DIAGNOSIS — Z87891 Personal history of nicotine dependence: Secondary | ICD-10-CM | POA: Insufficient documentation

## 2020-06-19 DIAGNOSIS — J449 Chronic obstructive pulmonary disease, unspecified: Secondary | ICD-10-CM | POA: Diagnosis not present

## 2020-06-19 DIAGNOSIS — R0789 Other chest pain: Secondary | ICD-10-CM

## 2020-06-19 DIAGNOSIS — R079 Chest pain, unspecified: Secondary | ICD-10-CM | POA: Diagnosis not present

## 2020-06-19 DIAGNOSIS — R0602 Shortness of breath: Secondary | ICD-10-CM | POA: Diagnosis not present

## 2020-06-19 LAB — CBC
HCT: 48.2 % (ref 39.0–52.0)
Hemoglobin: 16.7 g/dL (ref 13.0–17.0)
MCH: 30.3 pg (ref 26.0–34.0)
MCHC: 34.6 g/dL (ref 30.0–36.0)
MCV: 87.5 fL (ref 80.0–100.0)
Platelets: 180 10*3/uL (ref 150–400)
RBC: 5.51 MIL/uL (ref 4.22–5.81)
RDW: 13.4 % (ref 11.5–15.5)
WBC: 7.5 10*3/uL (ref 4.0–10.5)
nRBC: 0 % (ref 0.0–0.2)

## 2020-06-19 LAB — BASIC METABOLIC PANEL
Anion gap: 10 (ref 5–15)
BUN: 19 mg/dL (ref 6–20)
CO2: 23 mmol/L (ref 22–32)
Calcium: 9.5 mg/dL (ref 8.9–10.3)
Chloride: 106 mmol/L (ref 98–111)
Creatinine, Ser: 1.04 mg/dL (ref 0.61–1.24)
GFR calc Af Amer: >60 mL/min (ref >60)
GFR calc non Af Amer: >60 mL/min (ref >60)
Glucose, Bld: 86 mg/dL (ref 70–99)
Potassium: 3.7 mmol/L (ref 3.5–5.1)
Sodium: 139 mmol/L (ref 135–145)

## 2020-06-19 LAB — TROPONIN I (HIGH SENSITIVITY)
Troponin I (High Sensitivity): 4 ng/L (ref <18)
Troponin I (High Sensitivity): 3 ng/L (ref <18)

## 2020-06-19 MED ORDER — SODIUM CHLORIDE 0.9% FLUSH: 3.0000 mL | Freq: Once | INTRAVENOUS | Status: DC

## 2020-06-19 MED ORDER — ASPIRIN 81 MG PO CHEW
324.0000 mg | CHEWABLE_TABLET | Freq: Once | ORAL | Status: AC
  Administered 2020-06-19: 324 mg via ORAL
  Filled 2020-06-19: qty 4

## 2020-06-19 NOTE — ED Provider Notes (Signed)
[facility] Emergency Department Provider Note   ____________________________________________   First MD Initiated Contact with Patient 06/19/20 1251     (approximate)  I have reviewed the triage vital signs and the nursing notes.   HISTORY  Chief Complaint Chest Pain    HPI Derrick Horn is a 59 y.o. male reports intermittent twinges of chest pain and tightness.  Seems to be getting worse over the course of the week.  He is also a somewhat short of breath is worse with exercise.  Today he laid down and got short of breath lying down had to sit up and down.  He just turned 45 and has been healthy and exercising a lot up until very recently.  Patient is also having some nausea and today dry heaves with the chest discomfort.  He reports his father began having symptomatic heart disease needed a stent at age 44.     Past Medical History:  Diagnosis Date  . Chronic LBP   . COPD (chronic obstructive pulmonary disease) ([place])    minimal emphysema on CT scan  . GERD (gastroesophageal reflux disease)    mild  . Headache    silent migraine attack age 73's  . History of chest pain 2010   stress test WNL  . HLD (hyperlipidemia)    mild  . Recurrent sinus infections   . Sensorineural hearing loss (SNHL) of both ears 12/2015   mild-mod, no need for aides, rec melatoonin and lipoflavonoid [facility])  . Tear of left biceps muscle 01/19/2014    Patient Active Problem List   Diagnosis Date Noted  . Pedal edema 12/18/2018  . Itching 12/18/2018  . OSA (obstructive sleep apnea) 11/26/2018  . Cerebrovascular small vessel disease 11/21/2018  . Memory loss 11/18/2018  . Tinnitus of both ears 11/18/2018  . Vertigo 11/18/2018  . Rotator cuff tear arthropathy, right 03/03/2018  . Elevated PSA 03/31/2016  . Decreased hearing of both ears 12/29/2015  . Dysphagia 12/29/2015  . Sensorineural hearing loss (SNHL) of both ears 12/12/2015  . Hyperglycemia 02/20/2013  .  Healthcare maintenance 07/14/2011  . Dyslipidemia 07/14/2011  . LOW BACK PAIN, CHRONIC 08/25/2010  . Overweight 08/04/2010  . Ex-smoker 08/04/2010  . GERD 08/04/2010  . CHEST PAIN, HX OF 10/19/2007    Past Surgical History:  Procedure Laterality Date  . BICEPS TENDON REPAIR  2015   [name]  . CARPAL TUNNEL RELEASE     right  . chest CT  2011   no pulm nodules, mild apical scarring/emphysematous changes  . COLONOSCOPY  09/2010   2 hyperplastic polyps [name])  . ETT  2010   WNL (Dr. Johnsie Cancel)  . GREEN LIGHT LASER TURP (TRANSURETHRAL RESECTION OF PROSTATE N/A 05/30/2016   Procedure: GREEN LIGHT LASER TURP (TRANSURETHRAL RESECTION OF PROSTATE;  Surgeon: Royston Cowper, MD;  Location: [facility] ORS;  Service: Urology;  Laterality: N/A;  . SHOULDER ARTHROSCOPY WITH SUBACROMIAL DECOMPRESSION, ROTATOR CUFF REPAIR AND BICEP TENDON REPAIR  04/2018   Dr Onnie Graham    Prior to Admission medications   Medication Sig Start Date End Date Taking? Authorizing Provider  aspirin EC 81 MG tablet Take 1 tablet (81 mg total) by mouth daily. 11/18/18   Ria Bush, MD  Cyanocobalamin (B-12) 1000 MCG SUBL Place 1 tablet under the tongue daily. 11/18/18   Ria Bush, MD  MAGNESIUM PO Take 1 tablet by mouth daily.    [provider]  Multiple Vitamin (MULTIVITAMIN) capsule Take 1 capsule by  mouth daily.    [provider]    Allergies Azithromycin and Pyridium [phenazopyridine hcl]  Family History  Problem Relation Age of Onset  . Coronary artery disease Maternal Grandfather 35  . Stroke Maternal Grandfather   . Coronary artery disease Father 34       s/p stents, CABG  . Cancer Maternal Grandmother        uterine  . Diabetes Neg Hx     Social History Social History   Tobacco Use  . Smoking status: Former Smoker    Packs/day: 0.25    Years: 30.00    Pack years: 7.50    Types: Cigarettes    Quit date: 11/15/2018    Years since quitting: 1.5  . Smokeless tobacco:  Never Used  Substance Use Topics  . Alcohol use: No    Alcohol/week: 0.0 standard drinks  . Drug use: No    Review of Systems  Constitutional: No fever/chills Eyes: No visual changes. ENT: No sore throat. Cardiovascular:  chest pain. Respiratory: Some shortness of breath. Gastrointestinal: No abdominal pain.  No nausea, no vomiting.  No diarrhea.  No constipation. Genitourinary: Negative for dysuria. Musculoskeletal: Negative for back pain. Skin: Negative for rash. Neurological: Negative for headaches, focal weakness   ____________________________________________   PHYSICAL EXAM:  VITAL SIGNS: ED Triage Vitals  Enc Vitals Group     BP 06/19/20 1119 (!) 157/87     Pulse Rate 06/19/20 1119 (!) 50     Resp 06/19/20 1119 18     Temp 06/19/20 1119 98.4 F (36.9 C)     Temp Source 06/19/20 1119 Oral     SpO2 06/19/20 1119 99 %     Weight 06/19/20 1039 185 lb (83.9 kg)     Height 06/19/20 1039 5\' 10"  (1.778 m)     Head Circumference --      Peak Flow --      Pain Score 06/19/20 1039 4     Pain Loc --      Pain Edu? --      Excl. in Social Circle? --     Constitutional: Alert and oriented. Well appearing and in no acute distress. Head: Atraumatic. Nose: No congestion/rhinnorhea. Mouth/Throat: Mucous membranes are moist.  Oropharynx non-erythematous. Neck: No stridor.   Cardiovascular: Normal rate, regular rhythm. Grossly normal heart sounds.  Good peripheral circulation. Respiratory: Normal respiratory effort.  No retractions. Lungs CTAB. Gastrointestinal: Soft and nontender. No distention. No abdominal bruits. No CVA tenderness. Musculoskeletal: No lower extremity tenderness nor edema.   Neurologic:  Normal speech and language. No gross focal neurologic deficits are appreciated.  Skin:  Skin is warm, dry and intact. No rash noted.   ____________________________________________   LABS (all labs ordered are listed, but only abnormal results are displayed)  Labs Reviewed    BASIC METABOLIC PANEL  CBC  TROPONIN I (HIGH SENSITIVITY)  TROPONIN I (HIGH SENSITIVITY)   ____________________________________________  EKG  EKG read interpreted by me shows sinus bradycardia rate of 55.  Left axis no acute disease ____________________________________________  RADIOLOGY  ED MD interpretation: Chest x-ray read by radiology reviewed by me shows what appears to be an increasingly tortuous torturous thoracic aorta.  Official radiology report(s): DG Chest 2 View  Result Date: 06/19/2020 CLINICAL DATA:  Chest pain. EXAM: CHEST - 2 VIEW COMPARISON:  August 25, 2010 FINDINGS: There is a tortuous thoracic aorta, mildly more prominent the interval. No pneumothorax. The lungs are clear. The cardiomediastinal silhouette is otherwise normal. IMPRESSION: There  is a tortuous thoracic aorta which is mildly more prominent in the 10 year interval. No acute abnormalities identified. Electronically Signed   By: Dorise Bullion III M.D   On: 06/19/2020 11:42    ____________________________________________   PROCEDURES  Procedure(s) performed (including Critical Care):  Procedures   ____________________________________________   INITIAL IMPRESSION / ASSESSMENT AND PLAN / ED COURSE  Offered patient admission.  He prefers to go home.  This is also acceptable.  His troponins are negative pain is not lasting more than a minute or 2 at a time he says but it comes back frequently.  It is associated with the shortness of breath and nausea.  I have talked to Dr. Angelena Form about this gentleman he will follow him up early next week.              ____________________________________________   FINAL CLINICAL IMPRESSION(S) / ED DIAGNOSES  Final diagnoses:  Atypical chest pain     ED Discharge Orders    None       Note:  This document was prepared using Dragon voice recognition software and may include unintentional dictation errors.    Nena Polio,  MD 06/19/20 [phone]

## 2020-06-19 NOTE — Discharge Instructions (Addendum)
Please take an aspirin every day.  I gave you today's aspirin here in the emergency room.  Please follow-up with cardiology.  I spoken to Dr. Angelena Form about you he will be expecting you.  Please return here if you get pain that is worse or last longer than a minute or 2.

## 2020-06-19 NOTE — ED Notes (Signed)
Pt given crackers, OK per Dr. Cinda Quest

## 2020-06-19 NOTE — ED Triage Notes (Signed)
Pt to ER with c/o chest pain that started 3 days ago.  States this AM pain has moved to a more central pain with SHOB, diaphoresis and nausea/vomiting.

## 2020-06-19 NOTE — ED Notes (Signed)
Pt states that he has had increasing shortness of breath and chest pain for the past 4-7 days. Pt reports that shortness of breath is worse when laying flat. Pain is in the center of his chest and radiates throughout chest, pain is not worse on palpation. Pt reports having "hot flashes" as well and some nausea.

## 2020-06-21 ENCOUNTER — Ambulatory Visit: Payer: BC Managed Care – PPO | Admitting: Family Medicine

## 2020-06-21 ENCOUNTER — Other Ambulatory Visit: Payer: Self-pay

## 2020-06-21 ENCOUNTER — Encounter: Payer: Self-pay | Admitting: Family Medicine

## 2020-06-21 VITALS — BP 122/78 | HR 70 | Temp 97.9°F | Ht 70.0 in | Wt 189.1 lb

## 2020-06-21 DIAGNOSIS — I771 Stricture of artery: Secondary | ICD-10-CM

## 2020-06-21 DIAGNOSIS — R079 Chest pain, unspecified: Secondary | ICD-10-CM

## 2020-06-21 DIAGNOSIS — R1310 Dysphagia, unspecified: Secondary | ICD-10-CM

## 2020-06-21 DIAGNOSIS — R0789 Other chest pain: Secondary | ICD-10-CM | POA: Diagnosis not present

## 2020-06-21 DIAGNOSIS — E538 Deficiency of other specified B group vitamins: Secondary | ICD-10-CM

## 2020-06-21 DIAGNOSIS — R600 Localized edema: Secondary | ICD-10-CM | POA: Diagnosis not present

## 2020-06-21 DIAGNOSIS — Z87891 Personal history of nicotine dependence: Secondary | ICD-10-CM

## 2020-06-21 DIAGNOSIS — E785 Hyperlipidemia, unspecified: Secondary | ICD-10-CM

## 2020-06-21 DIAGNOSIS — Z125 Encounter for screening for malignant neoplasm of prostate: Secondary | ICD-10-CM

## 2020-06-21 DIAGNOSIS — G4733 Obstructive sleep apnea (adult) (pediatric): Secondary | ICD-10-CM

## 2020-06-21 DIAGNOSIS — K219 Gastro-esophageal reflux disease without esophagitis: Secondary | ICD-10-CM

## 2020-06-21 MED ORDER — CYANOCOBALAMIN 1000 MCG/ML IJ SOLN
1000.0000 ug | Freq: Once | INTRAMUSCULAR | Status: AC
  Administered 2020-06-21: 1000 ug via INTRAMUSCULAR

## 2020-06-21 MED ORDER — OMEPRAZOLE 40 MG PO CPDR: 40.0000 mg | DELAYED_RELEASE_CAPSULE | Freq: Every day | ORAL | 3 refills | Status: DC

## 2020-06-21 MED ORDER — B-12 1000 MCG SL SUBL: 1.0000 | SUBLINGUAL_TABLET | Freq: Every day | SUBLINGUAL | Status: DC

## 2020-06-21 NOTE — Patient Instructions (Addendum)
Labs today then B12 shot.  Keep heart doctor appointment.  I would also like to refer you to GI doctor. Start omeprazole 40mg  daily for the next 3 weeks.  Continue 325mg  aspirin daily while we get you in to see cardiology.  Try hydroxyzine 10mg  1/2-1 tablet as needed for stress/anxiety

## 2020-06-21 NOTE — Progress Notes (Signed)
Cardiology Office Note    Date:  06/23/2020   ID:  [name], DOB [birth year], MRN [id number]  PCP:  Ria Bush, MD  Cardiologist: Lauree Chandler, MD EPS: None  No chief complaint on file.   History of Present Illness:  Derrick Horn is a 59 y.o. male with history of HLD, family history of CAD, history of chest pain with normal stress test in 2010, CVA vs silent migraine 1990's.  Epic has conversion notes from Derrick Horn in 2007 but are not visible. Has OSA on CPAP.   Patient was seen in [place] emergency room 06/19/2020 with twinges of chest pain and tightness and shortness of breath with exercise.  Strong family history of heart disease with his father having a stent at age 65 and CABG 80..  Troponins negative other labs stable.  EKG sinus bradycardia with left axis deviation, no acute change.  ER physician discussed with Dr. Angelena Form who said we follow-up in the office.  Patient comes in for f/u accompanied by his wife. He has had trouble swallowing followed by chest pressure. Also under a lot of stress and has chest pain with anxiety.Wife just had CABG and father diagnosed with cancer, work very busy. Had dizziness, nausea, vomiting. No regular exercise. Active in yard and walking the dog and no chest pain with that. Some DOE. Smoked 30 yrs, quit 3 months ago.No DM or HTN. LDL 101 in 2019.   Past Medical History:  Diagnosis Date  . Chronic LBP   . COPD (chronic obstructive pulmonary disease) ([place])    minimal emphysema on CT scan  . GERD (gastroesophageal reflux disease)    mild  . Headache    silent migraine attack age 61's  . History of chest pain 2010   stress test WNL  . HLD (hyperlipidemia)    mild  . Recurrent sinus infections   . Sensorineural hearing loss (SNHL) of both ears 12/2015   mild-mod, no need for aides, rec melatoonin and lipoflavonoid [facility])  . Tear of left biceps muscle 01/19/2014    Past Surgical History:  Procedure Laterality  Date  . BICEPS TENDON REPAIR  2015   [name]  . CARPAL TUNNEL RELEASE     right  . chest CT  2011   no pulm nodules, mild apical scarring/emphysematous changes  . COLONOSCOPY  09/2010   2 hyperplastic polyps [name])  . ETT  2010   WNL (Dr. Johnsie Cancel)  . GREEN LIGHT LASER TURP (TRANSURETHRAL RESECTION OF PROSTATE N/A 05/30/2016   Procedure: GREEN LIGHT LASER TURP (TRANSURETHRAL RESECTION OF PROSTATE;  Surgeon: Royston Cowper, MD;  Location: [facility] ORS;  Service: Urology;  Laterality: N/A;  . SHOULDER ARTHROSCOPY WITH SUBACROMIAL DECOMPRESSION, ROTATOR CUFF REPAIR AND BICEP TENDON REPAIR  04/2018   Dr Onnie Graham    Current Medications: Current Meds  Medication Sig  . aspirin EC 81 MG tablet Take 1 tablet (81 mg total) by mouth daily.  . Cyanocobalamin (B-12) 1000 MCG SUBL Place 1 tablet under the tongue daily.  . hydrOXYzine (ATARAX/VISTARIL) 10 MG tablet Take 0.5-1 tablets (5-10 mg total) by mouth 2 (two) times daily as needed for anxiety.  Marland Kitchen omeprazole (PRILOSEC) 40 MG capsule Take 1 capsule (40 mg total) by mouth daily. Daily for 3 weeks then as needed     Allergies:   Azithromycin and Pyridium [phenazopyridine hcl]   Social History   Socioeconomic History  . Marital status: Married    Spouse name: Not on file  .  Number of children: 1  . Years of education: Not on file  . Highest education level: Not on file  Occupational History  . Occupation: Sales  Tobacco Use  . Smoking status: Former Smoker    Packs/day: 0.25    Years: 30.00    Pack years: 7.50    Types: Cigarettes    Quit date: 11/15/2018    Years since quitting: 1.6  . Smokeless tobacco: Never Used  Substance and Sexual Activity  . Alcohol use: No    Alcohol/week: 0.0 standard drinks  . Drug use: No  . Sexual activity: Not on file  Other Topics Concern  . 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   1 dog   Activity: shooting, yardwork, no regular exercise   Diet: more salads, more oatmeal, good water   Social Determinants of Health   Financial Resource Strain:   . Difficulty of Paying Living Expenses:   Food Insecurity:   . Worried About Charity fundraiser in the Last Year:   . Arboriculturist in the Last Year:   Transportation Needs:   . Film/video editor (Medical):   Marland Kitchen Lack of Transportation (Non-Medical):   Physical Activity:   . Days of Exercise per Week:   . Minutes of Exercise per Session:   Stress:   . Feeling of Stress :   Social Connections:   . Frequency of Communication with Friends and Family:   . Frequency of Social Gatherings with Friends and Family:   . Attends Religious Services:   . Active Member of Clubs or Organizations:   . Attends Archivist Meetings:   Marland Kitchen Marital Status:      Family History:  The patient's   family history includes Cancer in his maternal grandmother; Coronary artery disease (age of onset: 41) in his maternal grandfather; Coronary artery disease (age of onset: 59) in his father; Prostate cancer (age of onset: 56) in his father; Stroke in his maternal grandfather.   ROS:   Please see the history of present illness.    ROS All other systems reviewed and are negative.   PHYSICAL EXAM:   VS:  BP 110/72   Pulse (!) 50   Ht 5\' 10"  (1.778 m)   Wt 191 lb (86.6 kg)   SpO2 98%   BMI 27.41 kg/m   Physical Exam  GEN: Well nourished, well developed, in no acute distress  Neck: no JVD, carotid bruits, or masses Cardiac:RRR; no murmurs, rubs, or gallops  Respiratory:  clear to auscultation bilaterally, normal work of breathing GI: soft, nontender, nondistended, + BS Ext: without cyanosis, clubbing, or edema, Good distal pulses bilaterally Neuro:  Alert and Oriented x 3 Psych: euthymic mood, full affect  Wt Readings from Last 3 Encounters:  06/23/20 191 lb (86.6 kg)  06/21/20 189 lb 1 oz (85.8 kg)   06/19/20 185 lb (83.9 kg)      Studies/Labs Reviewed:   EKG:  EKG is not ordered today.   EKG reviewed from 06/21/2020 sinus bradycardia with left axis deviation no acute change Recent Labs: 06/19/2020: BUN 19; Creatinine, Ser 1.04; Hemoglobin 16.7; Platelets 180; Potassium 3.7; Sodium 139 06/21/2020: ALT 19; Pro B Natriuretic peptide (BNP) 27.0; TSH 3.31   Lipid Panel    Component Value Date/Time   CHOL 159 11/18/2018 0854  CHOL 221 06/03/2011 0000   TRIG 68.0 11/18/2018 0854   TRIG 83 06/03/2011 0000   HDL 44.20 11/18/2018 0854   CHOLHDL 4 11/18/2018 0854   VLDL 13.6 11/18/2018 0854   LDLCALC 101 (H) 11/18/2018 0854   LDLDIRECT 137 06/03/2011 0000    Additional studies/ records that were reviewed today include:      ASSESSMENT:    1. Chest pain, unspecified type   2. Family history of early CAD   3. Hyperlipidemia, unspecified hyperlipidemia type      PLAN:  In order of problems listed above:  Chest pain with swallowing and anxiety. EKG and troponins at [place] ok. Multiple CV risk factors. Will order Coronary CTA. No beta blocker as HR in 30's in am per patient and baseline bradycardia  Family history of CAD father with stent and CABG  HLD-LD 101 2019 will recheck  History of CVA vs migraine 1990's  Tobacco abuse-quit smoking recently but over 30 pk yrs  Tortuous aorta on CXR-check echo.    Medication Adjustments/Labs and Tests Ordered: Current medicines are reviewed at length with the patient today.  Concerns regarding medicines are outlined above.  Medication changes, Labs and Tests ordered today are listed in the Patient Instructions below. There are no Patient Instructions on file for this visit.   Sumner Boast, PA-C  06/23/2020 10:42 AM    [facility] [place], [place], [place]  [address] Phone: [phone]; Fax: [phone]

## 2020-06-21 NOTE — Progress Notes (Addendum)
This visit was conducted in person.  BP 122/78 (BP Location: Left Arm, Patient Position: Sitting, Cuff Size: Normal)   Pulse 70   Temp 97.9 F (36.6 C) (Temporal)   Ht 5\' 10"  (1.778 m)   Wt 189 lb 1 oz (85.8 kg)   SpO2 97%   BMI 27.13 kg/m    CC: ER f/u visit  Subjective:    Patient ID: Derrick Horn, male    DOB: [birth year], 60 y.o.   MRN: [id number]  HPI: Derrick Horn is a 59 y.o. male presenting on 06/21/2020 for Hospitalization Follow-up (Seen at [facility] ED on 06/19/20.  Pt accompanied by wife, [name]- temp 97.6.) and Joint Swelling (C/o bilateral ankle swelling, worse in left.  Started 06/19/20. )    Here with wife.  Seen at [facility] ER over the weekend with intermittent chest pain/tightness associated with worsening exertional dyspnea over the past month. He was hypertensive in the ER. Workup largely stable - EKG, CXR (tortuous thoracic aorta) and troponins normal x2. Has appt with cardiology later week.   Describes substernal chest discomfort that radiates to bilateral breasts described as dull ache. Can be associated with lightheadedness, dyspnea, pedal edema preceded by itching of feet, diaphoresis above the neck. Episode of nausea/vomiting on Saturday. Marked fatigue, low energy. Just not feeling well. Also notes significant dysphagia with any solid foods, even with liquids with associated odynophagia. Chronic GERD, recently doing better, manages with alka seltzer chew a few times a week. Some cold intolerance.   No new joint pains, fevers/chills, new rashes, recent tick bites. No unexpected weight loss or early satiety.  Memory has improved after starting CPAP for OSA.  No cigarette since earlier this year (after wife's CABG).   Fmhx heart disease - father age 77yo.  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      Relevant past medical, surgical, family and social history reviewed and updated as indicated. Interim medical history since our last visit reviewed. Allergies and medications reviewed and updated. Outpatient Medications Prior to Visit  Medication Sig Dispense Refill  . aspirin EC 81 MG tablet Take 1 tablet (81 mg total) by mouth daily.    . Cyanocobalamin (B-12) 1000 MCG SUBL Place 1 tablet under the tongue daily. 30 each   . MAGNESIUM PO Take 1 tablet by mouth daily.    . Multiple Vitamin (MULTIVITAMIN) capsule Take 1 capsule by mouth daily.     No facility-administered medications prior to visit.     Per HPI unless specifically indicated in ROS section below Review of Systems Objective:  BP 122/78 (BP Location: Left Arm, Patient Position: Sitting, Cuff Size: Normal)   Pulse 70   Temp 97.9 F (36.6 C) (Temporal)   Ht 5\' 10"  (1.778 m)   Wt 189 lb 1 oz (85.8 kg)   SpO2 97%   BMI 27.13 kg/m   Wt Readings from Last 3 Encounters:  06/21/20 189 lb 1 oz (85.8 kg)  06/19/20 185 lb (83.9 kg)  12/18/18 186 lb 4 oz (84.5 kg)      Physical Exam Vitals and nursing note reviewed.  Constitutional:      Appearance: Normal appearance. He is not ill-appearing.  Eyes:     Extraocular Movements: Extraocular movements intact.     Pupils: Pupils are equal, round, and reactive  to light.  Neck:     Thyroid: No thyroid mass, thyromegaly or thyroid tenderness.  Cardiovascular:     Rate and Rhythm: Normal rate and regular rhythm.     Pulses: Normal pulses.     Heart sounds: Normal heart sounds. No murmur heard.   Pulmonary:     Effort: Pulmonary effort is normal. No respiratory distress.     Breath sounds: Normal breath sounds. No wheezing, rhonchi or rales.  Chest:     Chest wall: Tenderness (discomfort to palpation R 2nd costochondral junction) present.  Abdominal:     General: Abdomen is protuberant. Bowel sounds are normal. There is no distension.     Palpations:  Abdomen is soft. There is no mass.     Tenderness: There is no abdominal tenderness. There is no right CVA tenderness, left CVA tenderness, guarding or rebound. Negative signs include Murphy's sign.     Hernia: No hernia is present.  Musculoskeletal:     Right lower leg: No edema.     Left lower leg: Edema (1+ pedal edema to foot) present.  Skin:    General: Skin is warm and dry.     Findings: No rash.  Neurological:     Mental Status: He is alert.  Psychiatric:        Mood and Affect: Mood normal.        Behavior: Behavior normal.       Results for orders placed or performed in visit on 06/21/20  Vitamin B12  Result Value Ref Range   Vitamin B-12 296 211 - 911 pg/mL  TSH  Result Value Ref Range   TSH 3.31 0.35 - 4.50 uIU/mL  Brain natriuretic peptide  Result Value Ref Range   Pro B Natriuretic peptide (BNP) 27.0 0.0 - 100.0 pg/mL  Hepatic function panel  Result Value Ref Range   Total Bilirubin 0.7 0.2 - 1.2 mg/dL   Bilirubin, Direct 0.1 0.0 - 0.3 mg/dL   Alkaline Phosphatase 91 39 - 117 U/L   AST 14 0 - 37 U/L   ALT 19 0 - 53 U/L   Total Protein 7.3 6.0 - 8.3 g/dL   Albumin 4.5 3.5 - 5.2 g/dL  PSA  Result Value Ref Range   PSA 1.89 0.10 - 4.00 ng/mL   Lab Results  Component Value Date   PSA1 4.3 (H) 03/31/2016   PSA 1.89 06/21/2020   PSA 2.01 11/18/2018   PSA 5.61 (H) 03/09/2016   Lab Results  Component Value Date   CHOL 159 11/18/2018   HDL 44.20 11/18/2018   LDLCALC 101 (H) 11/18/2018   LDLDIRECT 137 06/03/2011   TRIG 68.0 11/18/2018   CHOLHDL 4 11/18/2018    Assessment & Plan:  This visit occurred during the SARS-CoV-2 public health emergency.  Safety protocols were in place, including screening questions prior to the visit, additional usage of staff PPE, and extensive cleaning of exam room while observing appropriate contact time as indicated for disinfecting solutions.   Problem List Items Addressed This Visit    Tortuous aorta (HCC)    Slight  progression noted on CXR.       Pedal edema    New - first right now today left. Not consistent with DVT. ?sodium intake related. rec elevation of legs and increased water intake as well as avoiding salt in diet. Check LFTs, BNP and TSH.       Relevant Orders   TSH (Completed)   Brain natriuretic peptide (Completed)  OSA (obstructive sleep apnea)    Continues CPAP followed by neurology.       GERD    Start omeprazole 40mg  daily x 3 wks.  Refer to GI for associated dysphagia and odynophagia.       Relevant Medications   omeprazole (PRILOSEC) 40 MG capsule   Other Relevant Orders   Ambulatory referral to Gastroenterology   Ex-smoker    Congratulated on smoking cessation since earlier this year (02/2020)      Dysphagia    Present for several months, acutely worsenning over last 2 weeks associated with odynophagia - difficulty to both solids and liquids. Will need further GI eval - will refer and start PPI in interim.       Relevant Orders   Ambulatory referral to Gastroenterology   Dyslipidemia    Overdue for CPE/FLP - check next fasting labs. Not on statin.  The 10-year ASCVD risk score Mikey Bussing DC Brooke Bonito., et al., 2013) is: 7%   Values used to calculate the score:     Age: 59 years     Sex: Male     Is Non-Hispanic African American: No     Diabetic: No     Tobacco smoker: No     Systolic Blood Pressure: 631 mmHg     Is BP treated: No     HDL Cholesterol: 44.2 mg/dL     Total Cholesterol: 159 mg/dL       Chest discomfort - Primary    Stable evaluation in hospital recently with EKG and troponin x2. Agree with further cardiac evaluation already scheduled. ?GERD - start omeprazole, refer to GI. ?tortuous aorta contribution - doubtful. ?anxiety component - will trial hydroxyzine 5-10mg  PRN (he is very sensitive to benadryl sedation) ASA 325mg  daily started in ER - advised continue this until seen by cards later this week.       Relevant Orders   TSH (Completed)   Brain  natriuretic peptide (Completed)   Hepatic function panel (Completed)   B12 deficiency    H/o this in the past - has not been taking B12 replacement. Will check B12 levels then provide with IM B12 shot today.       Relevant Orders   Vitamin B12 (Completed)    Other Visit Diagnoses    Special screening for malignant neoplasm of prostate       Relevant Orders   PSA (Completed)       Meds ordered this encounter  Medications  . Cyanocobalamin (B-12) 1000 MCG SUBL    Sig: Place 1 tablet under the tongue daily.  Marland Kitchen omeprazole (PRILOSEC) 40 MG capsule    Sig: Take 1 capsule (40 mg total) by mouth daily. Daily for 3 weeks then as needed    Dispense:  30 capsule    Refill:  3  . cyanocobalamin ((VITAMIN B-12)) injection 1,000 mcg  . hydrOXYzine (ATARAX/VISTARIL) 10 MG tablet    Sig: Take 0.5-1 tablets (5-10 mg total) by mouth 2 (two) times daily as needed for anxiety.    Dispense:  30 tablet    Refill:  0   Orders Placed This Encounter  Procedures  . Vitamin B12  . TSH  . Brain natriuretic peptide  . Hepatic function panel  . PSA  . Ambulatory referral to Gastroenterology    Referral Priority:   Routine    Referral Type:   Consultation    Referral Reason:   Specialty Services Required    Number of Visits Requested:   1  Patient Instructions  Labs today then B12 shot.  Keep heart doctor appointment.  I would also like to refer you to GI doctor. Start omeprazole 40mg  daily for the next 3 weeks.  Continue 325mg  aspirin daily while we get you in to see cardiology.  Try hydroxyzine 10mg  1/2-1 tablet as needed for stress/anxiety   Follow up plan: No follow-ups on file.  Ria Bush, MD

## 2020-06-22 ENCOUNTER — Telehealth: Payer: Self-pay | Admitting: Internal Medicine

## 2020-06-22 ENCOUNTER — Telehealth: Payer: Self-pay

## 2020-06-22 ENCOUNTER — Telehealth: Payer: Self-pay | Admitting: *Deleted

## 2020-06-22 DIAGNOSIS — R1310 Dysphagia, unspecified: Secondary | ICD-10-CM

## 2020-06-22 LAB — HEPATIC FUNCTION PANEL
ALT: 19 U/L (ref 0–53)
AST: 14 U/L (ref 0–37)
Albumin: 4.5 g/dL (ref 3.5–5.2)
Alkaline Phosphatase: 91 U/L (ref 39–117)
Bilirubin, Direct: 0.1 mg/dL (ref 0.0–0.3)
Total Bilirubin: 0.7 mg/dL (ref 0.2–1.2)
Total Protein: 7.3 g/dL (ref 6.0–8.3)

## 2020-06-22 LAB — VITAMIN B12: Vitamin B-12: 296 pg/mL (ref 211–911)

## 2020-06-22 LAB — BRAIN NATRIURETIC PEPTIDE: Pro B Natriuretic peptide (BNP): 27 pg/mL (ref 0.0–100.0)

## 2020-06-22 LAB — TSH: TSH: 3.31 u[IU]/mL (ref 0.35–4.50)

## 2020-06-22 LAB — PSA: PSA: 1.89 ng/mL (ref 0.10–4.00)

## 2020-06-22 MED ORDER — HYDROXYZINE HCL 10 MG PO TABS: 5.0000 mg | ORAL_TABLET | Freq: Two times a day (BID) | ORAL | 0 refills | Status: DC | PRN

## 2020-06-22 NOTE — Telephone Encounter (Signed)
I'm sorry I did not send in hydroxyzine. I have sent. Ok to try gummy B12 vitamins.

## 2020-06-22 NOTE — Telephone Encounter (Signed)
Called wife back, and she states her husband has been having intermittent difficultly swallowing for about 6 months, but the last 2 weeks it has gotten a lot worse. He went to the ED on 06/19/20 with chest pain and for the difficulty swallowing. His initial cardiac workup she states was negative and he has a F/U appt. with cardiology tomorrow. He was last seen in our office by Dr. [name] for a colonoscopy in 2011. States now, anything he eats or drinks hurts going down and causes pain to radiate across his chest. I asked him to stay on soft foods until further recommendations. Please advise.

## 2020-06-22 NOTE — Telephone Encounter (Signed)
L MOM to schedule ED follow up

## 2020-06-22 NOTE — Telephone Encounter (Signed)
-----   Message from Rise Mu, PA-C sent at 06/20/2020 11:01 AM EDT ----- Please see message from Dr. Angelena Form ----- Message ----- From: Burnell Blanks, MD Sent: 06/20/2020  10:35 AM EDT To: Theora Gianotti, NP, Jeannette How, #  This patient was in the ED Saturday and [facility] and did not wish to stay for workup. He will need a new patient appointment in the [place] office if possible this week for chest pain. Thanks, Gerald Stabs

## 2020-06-22 NOTE — Telephone Encounter (Signed)
Spoke with pt's wife, [name], relaying Dr. Synthia Innocent message.  She verbalizes understanding and will inform pt.

## 2020-06-22 NOTE — Telephone Encounter (Signed)
Chart reviewed Highly doubt cardiac cause of pain but want to see what they think - have cced Ms. Bonnell Public who will see him tomorrow.  Unless they think he needs cardiac testing I am ok to scope him  I have 330 PM July 20 and we can add him there as a direct - he is immunized to Covid  Dx is odynophagia  If he needs cardiac w/u then likely need to reschedul an EGD  If he is deteriorating before then call back - would need to do at hospital but more costly and may get denied by insurance - we could do that but he was just started on PPI and think that may help as well as diet modification

## 2020-06-22 NOTE — Telephone Encounter (Signed)
Called patient and spoke to wife [name]) let her know Dr. [name] wants to see how the cardiology visit goes tomorrow, and if they do not feel the patient needs further cardiac workup, then he is OK with [place] scheduling a direct colonoscopy for him

## 2020-06-22 NOTE — Telephone Encounter (Signed)
Patient's wife (on [place]) called stating that her husband saw you yesterday. [name] stated under the instructions patient was advised to try Hydroxyzine but they did not get a script for that. [name] also stated that her husband was told to take B-12 SL, but she got him gummies and wants to know if that is okay? Pharmacy [facility]

## 2020-06-22 NOTE — Telephone Encounter (Signed)
Pt's spouse is requesting a call back from a nurse to schedule an appt since pt is experiencing trouble swallowing food along with chest pain.   Pt is a former Dr [name] pt.

## 2020-06-23 ENCOUNTER — Other Ambulatory Visit: Payer: Self-pay

## 2020-06-23 ENCOUNTER — Telehealth: Payer: Self-pay | Admitting: Family Medicine

## 2020-06-23 ENCOUNTER — Encounter: Payer: Self-pay | Admitting: Physician Assistant

## 2020-06-23 ENCOUNTER — Ambulatory Visit (INDEPENDENT_AMBULATORY_CARE_PROVIDER_SITE_OTHER): Payer: BC Managed Care – PPO | Admitting: Physician Assistant

## 2020-06-23 VITALS — BP 110/72 | HR 50 | Ht 70.0 in | Wt 191.0 lb

## 2020-06-23 DIAGNOSIS — I7121 Aneurysm of the ascending aorta, without rupture: Secondary | ICD-10-CM | POA: Insufficient documentation

## 2020-06-23 DIAGNOSIS — Z8249 Family history of ischemic heart disease and other diseases of the circulatory system: Secondary | ICD-10-CM | POA: Diagnosis not present

## 2020-06-23 DIAGNOSIS — I771 Stricture of artery: Secondary | ICD-10-CM

## 2020-06-23 DIAGNOSIS — R079 Chest pain, unspecified: Secondary | ICD-10-CM

## 2020-06-23 DIAGNOSIS — E785 Hyperlipidemia, unspecified: Secondary | ICD-10-CM

## 2020-06-23 DIAGNOSIS — Z720 Tobacco use: Secondary | ICD-10-CM

## 2020-06-23 DIAGNOSIS — E538 Deficiency of other specified B group vitamins: Secondary | ICD-10-CM | POA: Insufficient documentation

## 2020-06-23 NOTE — Assessment & Plan Note (Addendum)
Stable evaluation in hospital recently with EKG and troponin x2. Agree with further cardiac evaluation already scheduled. ?GERD - start omeprazole, refer to GI. ?tortuous aorta contribution - doubtful. ?anxiety component - will trial hydroxyzine 5-10mg  PRN (he is very sensitive to benadryl sedation) ASA 325mg  daily started in ER - advised continue this until seen by cards later this week.

## 2020-06-23 NOTE — Assessment & Plan Note (Signed)
Continues CPAP followed by neurology.

## 2020-06-23 NOTE — Assessment & Plan Note (Signed)
Start omeprazole 40mg  daily x 3 wks.  Refer to GI for associated dysphagia and odynophagia.

## 2020-06-23 NOTE — Assessment & Plan Note (Signed)
H/o this in the past - has not been taking B12 replacement. Will check B12 levels then provide with IM B12 shot today.

## 2020-06-23 NOTE — Telephone Encounter (Signed)
Cardiology ordered a CT of coronaries  Please find out how he is doing and let e know  We will not schedule anything yet but if swallowing is deterioating may need to scope before those results are in - that will require some extra coordination I think  Please let me know

## 2020-06-23 NOTE — Telephone Encounter (Signed)
Spoke with pt scheduling lab visit tomorrow at 7:45.

## 2020-06-23 NOTE — Assessment & Plan Note (Signed)
Overdue for CPE/FLP - check next fasting labs. Not on statin.  The 10-year ASCVD risk score Mikey Bussing DC Brooke Bonito., et al., 2013) is: 7%   Values used to calculate the score:     Age: 59 years     Sex: Male     Is Non-Hispanic African American: No     Diabetic: No     Tobacco smoker: No     Systolic Blood Pressure: 828 mmHg     Is BP treated: No     HDL Cholesterol: 44.2 mg/dL     Total Cholesterol: 159 mg/dL

## 2020-06-23 NOTE — Assessment & Plan Note (Signed)
Slight progression noted on CXR.

## 2020-06-23 NOTE — Patient Instructions (Addendum)
Medication Instructions:  Your physician recommends that you continue on your current medications as directed. Please refer to the Current Medication list given to you today.  *If you need a refill on your cardiac medications before your next appointment, please call your pharmacy*   Lab Work: Fasting Lipids   If you have labs (blood work) drawn today and your tests are completely normal, you will receive your results only by: Marland Kitchen MyChart Message (if you have MyChart) OR . A paper copy in the mail If you have any lab test that is abnormal or we need to change your treatment, we will call you to review the results.   Testing/Procedures: Your physician has requested that you have an echocardiogram. Echocardiography is a painless test that uses sound waves to create images of your heart. It provides your doctor with information about the size and shape of your heart and how well your heart's chambers and valves are working. This procedure takes approximately one hour. There are no restrictions for this procedure.     Follow-Up: At [facility], you and your health needs are our priority.  As part of our continuing mission to provide you with exceptional heart care, we have created designated Provider Care Teams.  These Care Teams include your primary Cardiologist (physician) and Advanced Practice Providers (APPs -  Physician Assistants and Nurse Practitioners) who all work together to provide you with the care you need, when you need it.  We recommend signing up for the patient portal called "MyChart".  Sign up information is provided on this After Visit Summary.  MyChart is used to connect with patients for Virtual Visits (Telemedicine).  Patients are able to view lab/test results, encounter notes, upcoming appointments, etc.  Non-urgent messages can be sent to your provider as well.   To learn more about what you can do with MyChart, go to [email or web address].    Your next appointment:    F/U after Testing  Provider:   Lauree Chandler, MD   Other Instructions  Your cardiac CT will be scheduled at one of the below locations:   [facility] [address] [place], [place] [address] [phone]  [place] [address] [place], [place] [address] [phone]  If scheduled at [facility], please arrive at the [facility] main entrance of [facility] 30 minutes prior to test start time. Proceed to the [facility] Radiology Department (first floor) to check-in and test prep.  If scheduled at [facility], please arrive 15 mins early for check-in and test prep.  Please follow these instructions carefully (unless otherwise directed):  Hold all erectile dysfunction medications at least 3 days (72 hrs) prior to test.  On the Night Before the Test: . Be sure to Drink plenty of water. . Do not consume any caffeinated/decaffeinated beverages or chocolate 12 hours prior to your test. . Do not take any antihistamines 12 hours prior to your test.   On the Day of the Test: . Drink plenty of water. Do not drink any water within one hour of the test. . Do not eat any food 4 hours prior to the test. . You may take your regular medications prior to the test.        After the Test: . Drink plenty of water. . After receiving IV contrast, you may experience a mild flushed feeling. This is normal. . On occasion, you may experience a mild  rash up to 24 hours after the test. This is not dangerous. If this occurs, you can take Benadryl 25 mg and increase your fluid intake. . If you experience trouble breathing, this can be serious. If it is severe call 911 IMMEDIATELY. If it is mild, please call our office. . If you take any of these medications: Glipizide/Metformin, Avandament, Glucavance, please do not take 48 hours after completing test unless otherwise  instructed.   Once we have confirmed authorization from your insurance company, we will call you to set up a date and time for your test. Based on how quickly your insurance processes prior authorizations requests, please allow up to 4 weeks to be contacted for scheduling your Cardiac CT appointment. Be advised that routine Cardiac CT appointments could be scheduled as many as 8 weeks after your provider has ordered it.  For non-scheduling related questions, please contact the cardiac imaging nurse navigator should you have any questions/concerns: Marchia Bond, Cardiac Imaging Nurse Navigator Burley Saver, Interim Cardiac Imaging Nurse [place] [facility] Direct Office Dial: [phone]   For scheduling needs, including cancellations and rescheduling, please call Vivien Rota at [phone], option 3.

## 2020-06-23 NOTE — Telephone Encounter (Signed)
Ok to get here. plz schedule fasting lab visit for lipid panel.  I have ordered and will forward to cards.

## 2020-06-23 NOTE — Telephone Encounter (Signed)
Patient called in stating cardiologist is wanting to do a routine lipid panel on patient. Patient is wondering if PCP would be able to approve having lipid panel done in office, as his cardiologist is not [place]. Orders already in system. Please advise.

## 2020-06-23 NOTE — Assessment & Plan Note (Signed)
Congratulated on smoking cessation since earlier this year (02/2020)

## 2020-06-23 NOTE — Assessment & Plan Note (Signed)
New - first right now today left. Not consistent with DVT. ?sodium intake related. rec elevation of legs and increased water intake as well as avoiding salt in diet. Check LFTs, BNP and TSH.

## 2020-06-23 NOTE — Assessment & Plan Note (Signed)
Present for several months, acutely worsenning over last 2 weeks associated with odynophagia - difficulty to both solids and liquids. Will need further GI eval - will refer and start PPI in interim.

## 2020-06-24 ENCOUNTER — Other Ambulatory Visit (INDEPENDENT_AMBULATORY_CARE_PROVIDER_SITE_OTHER): Payer: BC Managed Care – PPO

## 2020-06-24 DIAGNOSIS — E785 Hyperlipidemia, unspecified: Secondary | ICD-10-CM | POA: Diagnosis not present

## 2020-06-24 LAB — LIPID PANEL
Cholesterol: 148 mg/dL (ref 0–200)
HDL: 40.1 mg/dL (ref >39.00)
LDL Cholesterol: 94 mg/dL (ref 0–99)
NonHDL: 107.74
Total CHOL/HDL Ratio: 4
Triglycerides: 70 mg/dL (ref 0.0–149.0)
VLDL: 14 mg/dL (ref 0.0–40.0)

## 2020-06-24 NOTE — Telephone Encounter (Signed)
Ask him to do a barium swallow if he will - w/ tablet  We can get some info that way and do not need caridology clearance

## 2020-06-24 NOTE — Addendum Note (Signed)
Addended by: Marlon Pel on: 06/24/2020 04:25 PM   Modules accepted: Orders

## 2020-06-24 NOTE — Telephone Encounter (Signed)
Patient reports "I am about the same, no better no worse".  Is cutting his meat very small and chewing well.  Patient will have ECHO on 8/11 and coronary CT being authorized then it will get scheduled.  He will call back if his symptoms are worsening. He is also due he is due for his colonoscopy.  When he is able to set up EGD he would like to have a double.

## 2020-06-25 NOTE — Telephone Encounter (Signed)
Patient notified of the barium swallow scheduled for 07/06/26 9:30 at [facility].  He is asked to arrive at 9:15 and be NPO after midnight

## 2020-07-06 ENCOUNTER — Other Ambulatory Visit: Payer: Self-pay

## 2020-07-06 ENCOUNTER — Ambulatory Visit (HOSPITAL_COMMUNITY)
Admission: RE | Admit: 2020-07-06 | Discharge: 2020-07-06 | Disposition: A | Discharge status: Home / Self Care | Payer: BC Managed Care – PPO | Source: Ambulatory Visit | Attending: Internal Medicine | Admitting: Internal Medicine

## 2020-07-06 DIAGNOSIS — R1310 Dysphagia, unspecified: Secondary | ICD-10-CM | POA: Diagnosis not present

## 2020-07-06 DIAGNOSIS — K219 Gastro-esophageal reflux disease without esophagitis: Secondary | ICD-10-CM | POA: Diagnosis not present

## 2020-07-15 ENCOUNTER — Telehealth: Payer: Self-pay | Admitting: Cardiovascular Disease

## 2020-07-15 NOTE — Telephone Encounter (Signed)
  Wife is calling because the patient has orders for a CT but when she called [facility] they told her they were waiting on our office to handle the insurance. She called insurance company today and was told they have not received anything from [place] regarding CT approval. She would like to know where things stand with getting his CT.

## 2020-07-19 ENCOUNTER — Telehealth: Payer: Self-pay | Admitting: Cardiovascular Disease

## 2020-07-19 NOTE — Telephone Encounter (Signed)
° ° ° °  Pt's wife calling back to follow up CT auth. Secures chat Estill Bamberg and she said pt request is on the que and will worked on it this week. Pt's wife understood

## 2020-07-21 ENCOUNTER — Other Ambulatory Visit: Payer: Self-pay

## 2020-07-21 ENCOUNTER — Ambulatory Visit (INDEPENDENT_AMBULATORY_CARE_PROVIDER_SITE_OTHER): Payer: BC Managed Care – PPO

## 2020-07-21 DIAGNOSIS — I771 Stricture of artery: Secondary | ICD-10-CM | POA: Diagnosis not present

## 2020-07-21 DIAGNOSIS — R079 Chest pain, unspecified: Secondary | ICD-10-CM

## 2020-07-21 DIAGNOSIS — Z720 Tobacco use: Secondary | ICD-10-CM

## 2020-07-21 DIAGNOSIS — Z8249 Family history of ischemic heart disease and other diseases of the circulatory system: Secondary | ICD-10-CM

## 2020-07-21 DIAGNOSIS — E785 Hyperlipidemia, unspecified: Secondary | ICD-10-CM

## 2020-07-21 LAB — ECHOCARDIOGRAM COMPLETE
AR max vel: 2.88 cm2
AV Area VTI: 2.91 cm2
AV Area mean vel: 2.85 cm2
AV Mean grad: 3 mmHg
AV Peak grad: 6.3 mmHg
Ao pk vel: 1.25 m/s
Area-P 1/2: 7.51 cm2
Calc EF: 55.5 %
S' Lateral: 2.7 cm
Single Plane A2C EF: 55.5 %
Single Plane A4C EF: 55.1 %

## 2020-07-30 ENCOUNTER — Other Ambulatory Visit: Payer: Self-pay

## 2020-07-30 ENCOUNTER — Other Ambulatory Visit: Payer: BC Managed Care – PPO | Admitting: *Deleted

## 2020-07-30 DIAGNOSIS — Z8249 Family history of ischemic heart disease and other diseases of the circulatory system: Secondary | ICD-10-CM | POA: Diagnosis not present

## 2020-07-30 DIAGNOSIS — I771 Stricture of artery: Secondary | ICD-10-CM

## 2020-07-30 DIAGNOSIS — Z720 Tobacco use: Secondary | ICD-10-CM | POA: Diagnosis not present

## 2020-07-30 DIAGNOSIS — E785 Hyperlipidemia, unspecified: Secondary | ICD-10-CM | POA: Diagnosis not present

## 2020-07-30 LAB — BASIC METABOLIC PANEL
BUN/Creatinine Ratio: 20 (ref 10–24)
BUN: 21 mg/dL (ref 8–27)
CO2: 26 mmol/L (ref 20–29)
Calcium: 9.3 mg/dL (ref 8.6–10.2)
Chloride: 101 mmol/L (ref 96–106)
Creatinine, Ser: 1.06 mg/dL (ref 0.76–1.27)
GFR calc Af Amer: 88 mL/min/{1.73_m2} (ref >59)
GFR calc non Af Amer: 76 mL/min/{1.73_m2} (ref >59)
Glucose: 103 mg/dL — ABNORMAL HIGH (ref 65–99)
Potassium: 4.5 mmol/L (ref 3.5–5.2)
Sodium: 138 mmol/L (ref 134–144)

## 2020-08-02 ENCOUNTER — Telehealth (HOSPITAL_COMMUNITY): Payer: Self-pay | Admitting: Emergency Medicine

## 2020-08-02 NOTE — Telephone Encounter (Signed)
Reaching out to patient to offer assistance regarding upcoming cardiac imaging study; pt verbalizes understanding of appt date/time, parking situation and where to check in, pre-test NPO status and medications ordered, and verified current allergies; name and call back number provided for further questions should they arise Cass Vandermeulen RN Navigator Cardiac Imaging [facility] [phone] office [phone] cell 

## 2020-08-03 ENCOUNTER — Ambulatory Visit (HOSPITAL_COMMUNITY)
Admission: RE | Admit: 2020-08-03 | Discharge: 2020-08-03 | Disposition: A | Discharge status: Home / Self Care | Payer: BC Managed Care – PPO | Source: Ambulatory Visit | Attending: Physician Assistant | Admitting: Physician Assistant

## 2020-08-03 ENCOUNTER — Other Ambulatory Visit: Payer: Self-pay

## 2020-08-03 DIAGNOSIS — R079 Chest pain, unspecified: Secondary | ICD-10-CM | POA: Diagnosis not present

## 2020-08-03 MED ORDER — NITROGLYCERIN 0.4 MG SL SUBL
SUBLINGUAL_TABLET | SUBLINGUAL | Status: AC
  Filled 2020-08-03: qty 2

## 2020-08-03 MED ORDER — NITROGLYCERIN 0.4 MG SL SUBL
0.8000 mg | SUBLINGUAL_TABLET | Freq: Once | SUBLINGUAL | Status: AC
  Administered 2020-08-03: 0.8 mg via SUBLINGUAL

## 2020-08-03 MED ORDER — IOHEXOL 350 MG/ML SOLN
80.0000 mL | Freq: Once | INTRAVENOUS | Status: AC | PRN
  Administered 2020-08-03: 80 mL via INTRAVENOUS

## 2020-09-06 NOTE — Progress Notes (Signed)
Cardiology Office Note    Date:  09/08/2020   ID:  Derrick Horn, DOB 29-Aug-1961, MRN [id number]  PCP:  Ria Bush, MD  Cardiologist: Lauree Chandler, MD EPS: None  Chief Complaint  Patient presents with  . Follow-up    History of Present Illness:  Derrick Horn is a 59 y.o. male with history of HLD, family history of CAD, history of chest pain with normal stress test in 2010, CVA vs silent migraine 1990's.  Epic has conversion notes from [facility] in 2007 but are not visible. Has OSA on CPAP.    Patient was seen in [place] emergency room 06/19/2020 with twinges of chest pain and tightness and shortness of breath with exercise.  Strong family history of heart disease with his father having a stent at age 75 and CABG 69..  Troponins negative other labs stable.  EKG sinus bradycardia with left axis deviation, no acute change.  ER physician discussed with Dr. Angelena Form who said we follow-up in the office.   I saw the patient 06/23/2020 at which time he was under a lot of stress and having chest pain with anxiety.  His wife had just had CABG and father was diagnosed with cancer.  He had just quit smoking 3 months prior to that visit.  Tortuous aorta on chest x-ray so I ordered a echo which showed normal LVEF 60 to 65% aortic root was normal in size and structure.  Coronary calcium score was 0 minimal atherosclerosis in the mid LAD borderline dilated ascending thoracic aorta at 37 mm.  Consider other causes of chest pain.  Patient comes in for f/u. Since he started prilosec his chest pain has resolved. Scheduled to see GI. No exertional symptoms. Walks his dog 45 min daily.   Past Medical History:  Diagnosis Date  . Chronic LBP   . COPD (chronic obstructive pulmonary disease) ([place])    minimal emphysema on CT scan  . GERD (gastroesophageal reflux disease)    mild  . Headache    silent migraine attack age 33's  . History of chest pain 2010   stress test WNL  . HLD  (hyperlipidemia)    mild  . Recurrent sinus infections   . Sensorineural hearing loss (SNHL) of both ears 12/2015   mild-mod, no need for aides, rec melatoonin and lipoflavonoid [facility])  . Tear of left biceps muscle 01/19/2014    Past Surgical History:  Procedure Laterality Date  . BICEPS TENDON REPAIR  2015   [name]  . CARPAL TUNNEL RELEASE     right  . chest CT  2011   no pulm nodules, mild apical scarring/emphysematous changes  . COLONOSCOPY  09/2010   2 hyperplastic polyps [name])  . ETT  2010   WNL (Dr. Johnsie Cancel)  . GREEN LIGHT LASER TURP (TRANSURETHRAL RESECTION OF PROSTATE N/A 05/30/2016   Procedure: GREEN LIGHT LASER TURP (TRANSURETHRAL RESECTION OF PROSTATE;  Surgeon: Royston Cowper, MD;  Location: [facility] ORS;  Service: Urology;  Laterality: N/A;  . SHOULDER ARTHROSCOPY WITH SUBACROMIAL DECOMPRESSION, ROTATOR CUFF REPAIR AND BICEP TENDON REPAIR  04/2018   Dr Onnie Graham    Current Medications: Current Meds  Medication Sig  . aspirin EC 81 MG tablet Take 1 tablet (81 mg total) by mouth daily.  . Cyanocobalamin (B-12) 1000 MCG SUBL Place 1 tablet under the tongue daily.  . hydrOXYzine (ATARAX/VISTARIL) 10 MG tablet Take 0.5-1 tablets (5-10 mg total) by mouth 2 (two) times daily as needed for anxiety.  Marland Kitchen  omeprazole (PRILOSEC) 40 MG capsule Take 1 capsule (40 mg total) by mouth daily. Daily for 3 weeks then as needed     Allergies:   Azithromycin and Pyridium [phenazopyridine hcl]   Social History   Socioeconomic History  . Marital status: Married    Spouse name: Not on file  . Number of children: 1  . Years of education: Not on file  . Highest education level: Not on file  Occupational History  . Occupation: Sales  Tobacco Use  . Smoking status: Former Smoker    Packs/day: 0.25    Years: 30.00    Pack years: 7.50    Types: Cigarettes    Quit date: 11/15/2018    Years since quitting: 1.8  . Smokeless tobacco: Never Used  Substance and Sexual Activity  . Alcohol  use: No    Alcohol/week: 0.0 standard drinks  . Drug use: No  . Sexual activity: Not on file  Other Topics Concern  . 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   1 dog   Activity: shooting, yardwork, no regular exercise   Diet: more salads, more oatmeal, good water   Social Determinants of Health   Financial Resource Strain:   . Difficulty of Paying Living Expenses: Not on file  Food Insecurity:   . Worried About Charity fundraiser in the Last Year: Not on file  . Ran Out of Food in the Last Year: Not on file  Transportation Needs:   . Lack of Transportation (Medical): Not on file  . Lack of Transportation (Non-Medical): Not on file  Physical Activity:   . Days of Exercise per Week: Not on file  . Minutes of Exercise per Session: Not on file  Stress:   . Feeling of Stress : Not on file  Social Connections:   . Frequency of Communication with Friends and Family: Not on file  . Frequency of Social Gatherings with Friends and Family: Not on file  . Attends Religious Services: Not on file  . Active Member of Clubs or Organizations: Not on file  . Attends Archivist Meetings: Not on file  . Marital Status: Not on file     Family History:  The patient's family history includes Cancer in his maternal grandmother; Coronary artery disease (age of onset: 38) in his maternal grandfather; Coronary artery disease (age of onset: 73) in his father; Prostate cancer (age of onset: 29) in his father; Stroke in his maternal grandfather.   ROS:   Please see the history of present illness.    ROS All other systems reviewed and are negative.   PHYSICAL EXAM:   VS:  BP 118/78   Pulse (!) 50   Ht 5\' 10"  (1.778 m)   Wt 201 lb (91.2 kg)   SpO2 96%   BMI 28.84 kg/m   Physical Exam  GEN: Well nourished, well developed, in no acute distress  Neck: no JVD,  carotid bruits, or masses Cardiac:RRR; no murmurs, rubs, or gallops  Respiratory:  clear to auscultation bilaterally, normal work of breathing GI: soft, nontender, nondistended, + BS Ext: without cyanosis, clubbing, or edema, Good distal pulses bilaterally Neuro:  Alert and Oriented x 3 Psych: euthymic mood, full affect  Wt Readings from Last 3 Encounters:  09/08/20 201 lb (91.2 kg)  06/23/20 191 lb (86.6 kg)  06/21/20  189 lb 1 oz (85.8 kg)      Studies/Labs Reviewed:   EKG:  EKG is not ordered today.    Recent Labs: 06/19/2020: Hemoglobin 16.7; Platelets 180 06/21/2020: ALT 19; Pro B Natriuretic peptide (BNP) 27.0; TSH 3.31 07/30/2020: BUN 21; Creatinine, Ser 1.06; Potassium 4.5; Sodium 138   Lipid Panel    Component Value Date/Time   CHOL 148 06/24/2020 0747   CHOL 221 06/03/2011 0000   TRIG 70.0 06/24/2020 0747   TRIG 83 06/03/2011 0000   HDL 40.10 06/24/2020 0747   CHOLHDL 4 06/24/2020 0747   VLDL 14.0 06/24/2020 0747   LDLCALC 94 06/24/2020 0747   LDLDIRECT 137 06/03/2011 0000    Additional studies/ records that were reviewed today include:  Coronary CTA 08/03/2020 IMPRESSION: 1. Coronary calcium score of 0. This was 0 percentile for age and sex matched control.   2.  Normal coronary origin with right dominance.   3.  Minimal atherosclerosis of the mid LAD.  CAD RADS 1.   4.  Borderline dilated ascending thoracic aorta (9mm).   4.  Consider non-atherosclerotic causes of chest pain.   Traci Turner     Electronically Signed   By: Fransico Him   On: 08/04/2020 11:16    2D echo 8/2021IMPRESSIONS     1. Left ventricular ejection fraction, by estimation, is 60 to 65%. The  left ventricle has normal function. The left ventricle has no regional  wall motion abnormalities. Left ventricular diastolic parameters were  normal.   2. Right ventricular systolic function is normal. The right ventricular  size is mildly enlarged. There is normal pulmonary artery  systolic  pressure.   3. The mitral valve is normal in structure. No evidence of mitral valve  regurgitation. No evidence of mitral stenosis.   4. The aortic valve is normal in structure. Aortic valve regurgitation is  not visualized. No aortic stenosis is present.   5. The inferior vena cava is normal in size with greater than 50%  respiratory variability, suggesting right atrial pressure of 3 mmHg.   FINDINGS   Left Ventricle: Left ventricular ejection fraction, by estimation, is 60  to 65%. The left ventricle has normal function. The left ventricle has no  regional wall motion abnormalities. The left ventricular internal cavity  size was normal in size. There is   no left ventricular hypertrophy. Left ventricular diastolic parameters  were normal.   Right Ventricle: The right ventricular size is mildly enlarged. No  increase in right ventricular wall thickness. Right ventricular systolic  function is normal. There is normal pulmonary artery systolic pressure.  The tricuspid regurgitant velocity is 2.19   m/s, and with an assumed right atrial pressure of 3 mmHg, the estimated  right ventricular systolic pressure is 30.8 mmHg.   Left Atrium: Left atrial size was normal in size.   Right Atrium: Right atrial size was normal in size.   Pericardium: There is no evidence of pericardial effusion.   Mitral Valve: The mitral valve is normal in structure. Normal mobility of  the mitral valve leaflets. No evidence of mitral valve regurgitation. No  evidence of mitral valve stenosis.   Tricuspid Valve: The tricuspid valve is normal in structure. Tricuspid  valve regurgitation is not demonstrated. No evidence of tricuspid  stenosis.   Aortic Valve: The aortic valve is normal in structure. Aortic valve  regurgitation is not visualized. No aortic stenosis is present. Aortic  valve mean gradient measures 3.0 mmHg. Aortic valve peak gradient  measures  6.2 mmHg. Aortic valve area, by VTI    measures 2.91 cm.   Pulmonic Valve: The pulmonic valve was normal in structure. Pulmonic valve  regurgitation is not visualized. No evidence of pulmonic stenosis.   Aorta: The aortic root is normal in size and structure.   Venous: The inferior vena cava is normal in size with greater than 50%  respiratory variability, suggesting right atrial pressure of 3 mmHg.   IAS/Shunts: No atrial level shunt detected by color flow Doppler.        ASSESSMENT:    1. Chest pain, unspecified type   2. Family history of early CAD   3. Hyperlipidemia, unspecified hyperlipidemia type   4. Tortuous aorta (HCC)   5. Tobacco abuse   6. Ascending aorta dilatation (HCC)      PLAN:  In order of problems listed above:  History of chest pain with swallowing and anxiety.  Multiple CV risk factors.  Coronary CTA calcium score 0 no atherosclerosis in the mid LAD-No trouble since starting Prilosec. Has appt with GI. Continue yearly f/u  Family history of CAD father had stent CABG  Hyperlipidemia LDL 94-recommend diet and exercise. With calcium score of 0 would not commit to statin at this time.  Tortuous aorta on chest x-ray echo normal aortic root.  Tobacco abuse over 30 pack years quit   dilated ascending thoracic aorta at 37 mm. Will monitor   Medication Adjustments/Labs and Tests Ordered: Current medicines are reviewed at length with the patient today.  Concerns regarding medicines are outlined above.  Medication changes, Labs and Tests ordered today are listed in the Patient Instructions below. Patient Instructions  Medication Instructions:  Your physician recommends that you continue on your current medications as directed. Please refer to the Current Medication list given to you today.  *If you need a refill on your cardiac medications before your next appointment, please call your pharmacy*   Lab Work: None If you have labs (blood work) drawn today and your tests are completely  normal, you will receive your results only by: Marland Kitchen MyChart Message (if you have MyChart) OR . A paper copy in the mail If you have any lab test that is abnormal or we need to change your treatment, we will call you to review the results.   Testing/Procedures: None   Follow-Up: At [facility], you and your health needs are our priority.  As part of our continuing mission to provide you with exceptional heart care, we have created designated Provider Care Teams.  These Care Teams include your primary Cardiologist (physician) and Advanced Practice Providers (APPs -  Physician Assistants and Nurse Practitioners) who all work together to provide you with the care you need, when you need it.  We recommend signing up for the patient portal called "MyChart".  Sign up information is provided on this After Visit Summary.  MyChart is used to connect with patients for Virtual Visits (Telemedicine).  Patients are able to view lab/test results, encounter notes, upcoming appointments, etc.  Non-urgent messages can be sent to your provider as well.   To learn more about what you can do with MyChart, go to [email or web address].    Your next appointment:   12 month(s)  The format for your next appointment:   In Person  Provider:   You may see Lauree Chandler, MD or one of the following Advanced Practice Providers on your designated Care Team:    Melina Copa, PA-C  Ermalinda Barrios, PA-C  Other Instructions Your provider recommends that you maintain 150 minutes per week of moderate aerobic activity.      Sumner Boast, PA-C  09/08/2020 9:50 AM    [facility] [address], [place], [place]  [address] Phone: [phone]; Fax: [phone]

## 2020-09-08 ENCOUNTER — Other Ambulatory Visit: Payer: Self-pay

## 2020-09-08 ENCOUNTER — Encounter: Payer: Self-pay | Admitting: Physician Assistant

## 2020-09-08 ENCOUNTER — Ambulatory Visit (INDEPENDENT_AMBULATORY_CARE_PROVIDER_SITE_OTHER): Payer: BC Managed Care – PPO | Admitting: Physician Assistant

## 2020-09-08 VITALS — BP 118/78 | HR 50 | Ht 70.0 in | Wt 201.0 lb

## 2020-09-08 DIAGNOSIS — Z720 Tobacco use: Secondary | ICD-10-CM

## 2020-09-08 DIAGNOSIS — I77810 Thoracic aortic ectasia: Secondary | ICD-10-CM

## 2020-09-08 DIAGNOSIS — R079 Chest pain, unspecified: Secondary | ICD-10-CM

## 2020-09-08 DIAGNOSIS — I771 Stricture of artery: Secondary | ICD-10-CM | POA: Diagnosis not present

## 2020-09-08 DIAGNOSIS — Z8249 Family history of ischemic heart disease and other diseases of the circulatory system: Secondary | ICD-10-CM

## 2020-09-08 DIAGNOSIS — E785 Hyperlipidemia, unspecified: Secondary | ICD-10-CM | POA: Diagnosis not present

## 2020-09-08 NOTE — Patient Instructions (Signed)
Medication Instructions:  Your physician recommends that you continue on your current medications as directed. Please refer to the Current Medication list given to you today.  *If you need a refill on your cardiac medications before your next appointment, please call your pharmacy*   Lab Work: None If you have labs (blood work) drawn today and your tests are completely normal, you will receive your results only by: Marland Kitchen MyChart Message (if you have MyChart) OR . A paper copy in the mail If you have any lab test that is abnormal or we need to change your treatment, we will call you to review the results.   Testing/Procedures: None   Follow-Up: At [facility], you and your health needs are our priority.  As part of our continuing mission to provide you with exceptional heart care, we have created designated Provider Care Teams.  These Care Teams include your primary Cardiologist (physician) and Advanced Practice Providers (APPs -  Physician Assistants and Nurse Practitioners) who all work together to provide you with the care you need, when you need it.  We recommend signing up for the patient portal called "MyChart".  Sign up information is provided on this After Visit Summary.  MyChart is used to connect with patients for Virtual Visits (Telemedicine).  Patients are able to view lab/test results, encounter notes, upcoming appointments, etc.  Non-urgent messages can be sent to your provider as well.   To learn more about what you can do with MyChart, go to [email or web address].    Your next appointment:   12 month(s)  The format for your next appointment:   In Person  Provider:   You may see Lauree Chandler, MD or one of the following Advanced Practice Providers on your designated Care Team:    Melina Copa, PA-C  Ermalinda Barrios, PA-C    Other Instructions Your provider recommends that you maintain 150 minutes per week of moderate aerobic activity.

## 2020-09-14 ENCOUNTER — Other Ambulatory Visit: Payer: Self-pay | Admitting: Family Medicine

## 2020-09-15 NOTE — Telephone Encounter (Signed)
Requesting 90-day rx.  Is this ok?

## 2020-10-11 ENCOUNTER — Other Ambulatory Visit: Payer: Self-pay | Admitting: Family Medicine

## 2020-10-20 ENCOUNTER — Encounter: Payer: BC Managed Care – PPO | Admitting: Gastroenterology

## 2020-11-11 DIAGNOSIS — G4733 Obstructive sleep apnea (adult) (pediatric): Secondary | ICD-10-CM | POA: Diagnosis not present

## 2021-04-05 ENCOUNTER — Encounter: Payer: Self-pay | Admitting: Internal Medicine

## 2021-04-08 ENCOUNTER — Other Ambulatory Visit: Payer: Self-pay

## 2021-04-08 ENCOUNTER — Telehealth (INDEPENDENT_AMBULATORY_CARE_PROVIDER_SITE_OTHER): Payer: BC Managed Care – PPO | Admitting: Family Medicine

## 2021-04-08 VITALS — Temp 99.8°F | Ht 69.0 in | Wt 190.0 lb

## 2021-04-08 DIAGNOSIS — U071 COVID-19: Secondary | ICD-10-CM

## 2021-04-08 DIAGNOSIS — Z8709 Personal history of other diseases of the respiratory system: Secondary | ICD-10-CM

## 2021-04-08 DIAGNOSIS — E663 Overweight: Secondary | ICD-10-CM

## 2021-04-08 HISTORY — DX: COVID-19: U07.1

## 2021-04-08 MED ORDER — GUAIFENESIN-CODEINE 100-10 MG/5ML PO SYRP: 5.0000 mL | ORAL_SOLUTION | Freq: Every evening | ORAL | 0 refills | Status: DC | PRN

## 2021-04-08 MED ORDER — NIRMATRELVIR/RITONAVIR (PAXLOVID)TABLET
3.0000 | ORAL_TABLET | Freq: Two times a day (BID) | ORAL | 0 refills | Status: AC
Start: 2021-04-08 — End: 2021-04-13

## 2021-04-08 NOTE — Progress Notes (Signed)
VIRTUAL VISIT Due to national recommendations of social distancing due to [place] 19, a virtual visit is felt to be most appropriate for this patient at this time.   I connected with the patient on 04/08/21 at 10:20 AM EDT by virtual telehealth platform and verified that I am speaking with the correct person using two identifiers.   I discussed the limitations, risks, security and privacy concerns of performing an evaluation and management service by  virtual telehealth platform and the availability of in person appointments. I also discussed with the patient that there may be a patient responsible charge related to this service. The patient expressed understanding and agreed to proceed.  Patient location: Home Provider Location: [facility] Participants: Eliezer Lofts and [name]   Chief Complaint  Patient presents with  . Cough  . Generalized Body Aches  . Headache  . Covid Positive    Home test was positive this morning     History of Present Illness:  60 year old male with BMI > 25, history  of COPD patient presents with positive COVID test. He reports  Date of onset 4/28/ Wife was sick contact.  He started with headache and dry cough. No SOB, no wheeze.  Feels body ache, body pain. Neck stiff and achy.  Milld nausea.   Fever 99.73F  No ear pain, no sore throat.  Keeping up with fluids.. Normal urine output.  He is using  Sudafed and 1/2 mucinex for symptoms.. helped some.  Reviewed last labs from 07/2020 GFR 76  COVID 19 screen COVID testing: Home Test positive this AM. COVID vaccine:12/18/2020 , 04/30/2020 , 03/20/2020 COVID exposure: No recent travel or known exposure to [place]  The importance of social distancing was discussed today.     Review of Systems  Constitutional: Positive for chills, fever and malaise/fatigue.  HENT: Positive for congestion. Negative for ear pain.   Eyes: Negative for pain and redness.  Respiratory: Positive for cough.  Negative for shortness of breath.   Cardiovascular: Negative for chest pain, palpitations and leg swelling.  Gastrointestinal: Negative for abdominal pain, blood in stool, constipation, diarrhea, nausea and vomiting.  Genitourinary: Negative for dysuria.  Musculoskeletal: Negative for falls and myalgias.  Skin: Negative for rash.  Neurological: Positive for headaches. Negative for dizziness.  Psychiatric/Behavioral: Negative for depression. The patient is not nervous/anxious.       Past Medical History:  Diagnosis Date  . Chronic LBP   . COPD (chronic obstructive pulmonary disease) ([place])    minimal emphysema on CT scan  . GERD (gastroesophageal reflux disease)    mild  . Headache    silent migraine attack age 68's  . History of chest pain 2010   stress test WNL  . HLD (hyperlipidemia)    mild  . Recurrent sinus infections   . Sensorineural hearing loss (SNHL) of both ears 12/2015   mild-mod, no need for aides, rec melatoonin and lipoflavonoid [facility])  . Tear of left biceps muscle 01/19/2014    reports that he quit smoking about 2 years ago. His smoking use included cigarettes. He has a 7.50 pack-year smoking history. He has never used smokeless tobacco. He reports that he does not drink alcohol and does not use drugs.   Current Outpatient Medications:  .  aspirin EC 81 MG tablet, Take 1 tablet (81 mg total) by mouth daily., Disp: , Rfl:  .  Cyanocobalamin (B-12) 1000 MCG SUBL, Place 1 tablet under the tongue daily., Disp: ,  Rfl:  .  hydrOXYzine (ATARAX/VISTARIL) 10 MG tablet, TAKE 0.5-1 TABLETS (5-10 MG TOTAL) BY MOUTH 2 (TWO) TIMES DAILY AS NEEDED FOR ANXIETY., Disp: 180 tablet, Rfl: 1 .  omeprazole (PRILOSEC) 40 MG capsule, TAKE 1 CAPSULE (40 MG TOTAL) BY MOUTH DAILY. DAILY FOR 3 WEEKS THEN AS NEEDED, Disp: 30 capsule, Rfl: 3   Observations/Objective: Temperature 99.8 F (37.7 C), temperature source Oral.  Physical Exam  Physical Exam Constitutional:      General: The  patient is not in acute distress. Pulmonary:     Effort: Pulmonary effort is normal. No respiratory distress.  Neurological:     Mental Status: The patient is alert and oriented to person, place, and time.  Psychiatric:        Mood and Affect: Mood normal.        Behavior: Behavior normal.   Assessment and Plan Problem List Items Addressed This Visit    COVID-19 virus infection - Primary      COVID19  Infection < 5 days from onset of symptoms in triple vaccinated overweight individual with history of COPD.  No clear sign of bacterial infection at this time.   No SOB.  No red flags/need for ER visit or in-person exam at respiratory clinic at this time..    Pt higher risk for COVID complications given  History of COPD and BMI > 25.  Start paxlovid 5 day course. GFR  76 and no medication contraindications.  Symptomatic care with mucinex and cough suppressant at night. If SOB begins symptoms worsening.. have low threshold for in-person exam, if severe shortness of breath ER visit recommended.  Can monitor Oxygen saturation at home with home monitor if able to obtain.  Go to ER if O2 sat < 90% on room air.  Reviewed home care and provided information through [place].  Recommended quarantine 5 days isolation recommended. Return to work day 6 and wear mask for 4 more days to complete 10 days. Provided info about prevention of spread of COVID 19.       Relevant Medications   nirmatrelvir/ritonavir EUA (PAXLOVID) TABS   Overweight    Other Visit Diagnoses    History of COPD            I discussed the assessment and treatment plan with the patient. The patient was provided an opportunity to ask questions and all were answered. The patient agreed with the plan and demonstrated an understanding of the instructions.   The patient was advised to call back or seek an in-person evaluation if the symptoms worsen or if the condition fails to improve as anticipated.     Eliezer Lofts, MD

## 2021-04-08 NOTE — Assessment & Plan Note (Signed)
COVID19  Infection < 5 days from onset of symptoms in triple vaccinated overweight individual with history of COPD.  No clear sign of bacterial infection at this time.   No SOB.  No red flags/need for ER visit or in-person exam at respiratory clinic at this time..    Pt higher risk for COVID complications given  History of COPD and BMI > 25.  Start paxlovid 5 day course. GFR  76 and no medication contraindications.  Symptomatic care with mucinex and cough suppressant at night. If SOB begins symptoms worsening.. have low threshold for in-person exam, if severe shortness of breath ER visit recommended.  Can monitor Oxygen saturation at home with home monitor if able to obtain.  Go to ER if O2 sat < 90% on room air.  Reviewed home care and provided information through [place].  Recommended quarantine 5 days isolation recommended. Return to work day 6 and wear mask for 4 more days to complete 10 days. Provided info about prevention of spread of COVID 19.

## 2021-04-08 NOTE — Patient Instructions (Signed)
COVID19  Infection < 5 days from onset of symptoms in triple vaccinated overweight individual with history of COPD.  No clear sign of bacterial infection at this time.   No SOB.  No red flags/need for ER visit or in-person exam at respiratory clinic at this time..    Pt higher risk for COVID complications given  History of COPD and BMI > 25.  Start paxlovid 5 day course. GFR  76 and no medication contraindications.  Symptomatic care with mucinex and cough suppressant at night. If SOB begins symptoms worsening.. have low threshold for in-person exam, if severe shortness of breath ER visit recommended.  Can monitor Oxygen saturation at home with home monitor if able to obtain.  Go to ER if O2 sat < 90% on room air.  Reviewed home care and provided information through [place].  Recommended quarantine 5 days isolation recommended. Return to work day 6 and wear mask for 4 more days to complete 10 days. Provided info about prevention of spread of COVID 19.     Person Under Monitoring Name: Derrick Horn  Location: [place] [address]   Infection Prevention Recommendations for Individuals Confirmed to have, or Being Evaluated for, 2019 Novel Coronavirus (COVID-19) Infection Who Receive Care at Home  Individuals who are confirmed to have, or are being evaluated for, COVID-19 should follow the prevention steps below until a healthcare provider or local or state health department says they can return to normal activities.  Stay home except to get medical care You should restrict activities outside your home, except for getting medical care. Do not go to work, school, or public areas, and do not use public transportation or taxis.  Call ahead before visiting your doctor Before your medical appointment, call the healthcare provider and tell them that you have, or are being evaluated for, COVID-19 infection. This will help the healthcare provider's office take  steps to keep other people from getting infected. Ask your healthcare provider to call the local or state health department.  Monitor your symptoms Seek prompt medical attention if your illness is worsening (e.g., difficulty breathing). Before going to your medical appointment, call the healthcare provider and tell them that you have, or are being evaluated for, COVID-19 infection. Ask your healthcare provider to call the local or state health department.  Wear a facemask You should wear a facemask that covers your nose and mouth when you are in the same room with other people and when you visit a healthcare provider. People who live with or visit you should also wear a facemask while they are in the same room with you.  Separate yourself from other people in your home As much as possible, you should stay in a different room from other people in your home. Also, you should use a separate bathroom, if available.  Avoid sharing household items You should not share dishes, drinking glasses, cups, eating utensils, towels, bedding, or other items with other people in your home. After using these items, you should wash them thoroughly with soap and water.  Cover your coughs and sneezes Cover your mouth and nose with a tissue when you cough or sneeze, or you can cough or sneeze into your sleeve. Throw used tissues in a lined trash can, and immediately wash your hands with soap and water for at least 20 seconds or use an alcohol-based hand rub.  Wash your [facility] your hands often and thoroughly with soap and water for at  least 20 seconds. You can use an alcohol-based hand sanitizer if soap and water are not available and if your hands are not visibly dirty. Avoid touching your eyes, nose, and mouth with unwashed hands.   Prevention Steps for Caregivers and Household Members of Individuals Confirmed to have, or Being Evaluated for, COVID-19 Infection Being Cared for in the Home  If you  live with, or provide care at home for, a person confirmed to have, or being evaluated for, COVID-19 infection please follow these guidelines to prevent infection:  Follow healthcare provider's instructions Make sure that you understand and can help the patient follow any healthcare provider instructions for all care.  Provide for the patient's basic needs You should help the patient with basic needs in the home and provide support for getting groceries, prescriptions, and other personal needs.  Monitor the patient's symptoms If they are getting sicker, call his or her medical provider and tell them that the patient has, or is being evaluated for, COVID-19 infection. This will help the healthcare provider's office take steps to keep other people from getting infected. Ask the healthcare provider to call the local or state health department.  Limit the number of people who have contact with the patient  If possible, have only one caregiver for the patient.  Other household members should stay in another home or place of residence. If this is not possible, they should stay  in another room, or be separated from the patient as much as possible. Use a separate bathroom, if available.  Restrict visitors who do not have an essential need to be in the home.  Keep older adults, very young children, and other sick people away from the patient Keep older adults, very young children, and those who have compromised immune systems or chronic health conditions away from the patient. This includes people with chronic heart, lung, or kidney conditions, diabetes, and cancer.  Ensure good ventilation Make sure that shared spaces in the home have good air flow, such as from an air conditioner or an opened window, weather permitting.  Wash your hands often  Wash your hands often and thoroughly with soap and water for at least 20 seconds. You can use an alcohol based hand sanitizer if soap and water are  not available and if your hands are not visibly dirty.  Avoid touching your eyes, nose, and mouth with unwashed hands.  Use disposable paper towels to dry your hands. If not available, use dedicated cloth towels and replace them when they become wet.  Wear a facemask and gloves  Wear a disposable facemask at all times in the room and gloves when you touch or have contact with the patient's blood, body fluids, and/or secretions or excretions, such as sweat, saliva, sputum, nasal mucus, vomit, urine, or feces.  Ensure the mask fits over your nose and mouth tightly, and do not touch it during use.  Throw out disposable facemasks and gloves after using them. Do not reuse.  Wash your hands immediately after removing your facemask and gloves.  If your personal clothing becomes contaminated, carefully remove clothing and launder. Wash your hands after handling contaminated clothing.  Place all used disposable facemasks, gloves, and other waste in a lined container before disposing them with other household waste.  Remove gloves and wash your hands immediately after handling these items.  Do not share dishes, glasses, or other household items with the patient  Avoid sharing household items. You should not share dishes, drinking glasses,  cups, eating utensils, towels, bedding, or other items with a patient who is confirmed to have, or being evaluated for, COVID-19 infection.  After the person uses these items, you should wash them thoroughly with soap and water.  Wash laundry thoroughly  Immediately remove and wash clothes or bedding that have blood, body fluids, and/or secretions or excretions, such as sweat, saliva, sputum, nasal mucus, vomit, urine, or feces, on them.  Wear gloves when handling laundry from the patient.  Read and follow directions on labels of laundry or clothing items and detergent. In general, wash and dry with the warmest temperatures recommended on the label.  Clean  all areas the individual has used often  Clean all touchable surfaces, such as counters, tabletops, doorknobs, bathroom fixtures, toilets, phones, keyboards, tablets, and bedside tables, every day. Also, clean any surfaces that may have blood, body fluids, and/or secretions or excretions on them.  Wear gloves when cleaning surfaces the patient has come in contact with.  Use a diluted bleach solution (e.g., dilute bleach with 1 part bleach and 10 parts water) or a household disinfectant with a label that says EPA-registered for coronaviruses. To make a bleach solution at home, add 1 tablespoon of bleach to 1 quart (4 cups) of water. For a larger supply, add  cup of bleach to 1 gallon (16 cups) of water.  Read labels of cleaning products and follow recommendations provided on product labels. Labels contain instructions for safe and effective use of the cleaning product including precautions you should take when applying the product, such as wearing gloves or eye protection and making sure you have good ventilation during use of the product.  Remove gloves and wash hands immediately after cleaning.  Monitor yourself for signs and symptoms of illness Caregivers and household members are considered close contacts, should monitor their health, and will be asked to limit movement outside of the home to the extent possible. Follow the monitoring steps for close contacts listed on the symptom monitoring form.   ? If you have additional questions, contact your local health department or call the epidemiologist on call at [phone] (available 24/7). ? This guidance is subject to change. For the most up-to-date guidance from [facility], please refer to their website: YouBlogs.pl

## 2021-06-12 ENCOUNTER — Other Ambulatory Visit: Payer: Self-pay | Admitting: Family Medicine

## 2021-06-12 DIAGNOSIS — E785 Hyperlipidemia, unspecified: Secondary | ICD-10-CM

## 2021-06-12 DIAGNOSIS — E538 Deficiency of other specified B group vitamins: Secondary | ICD-10-CM

## 2021-06-12 DIAGNOSIS — Z125 Encounter for screening for malignant neoplasm of prostate: Secondary | ICD-10-CM

## 2021-06-17 ENCOUNTER — Ambulatory Visit: Payer: BC Managed Care – PPO | Admitting: Family Medicine

## 2021-06-17 ENCOUNTER — Other Ambulatory Visit (INDEPENDENT_AMBULATORY_CARE_PROVIDER_SITE_OTHER): Payer: BC Managed Care – PPO

## 2021-06-17 ENCOUNTER — Telehealth: Payer: Self-pay | Admitting: Family Medicine

## 2021-06-17 ENCOUNTER — Other Ambulatory Visit: Payer: Self-pay

## 2021-06-17 DIAGNOSIS — E538 Deficiency of other specified B group vitamins: Secondary | ICD-10-CM

## 2021-06-17 DIAGNOSIS — E785 Hyperlipidemia, unspecified: Secondary | ICD-10-CM

## 2021-06-17 DIAGNOSIS — Z125 Encounter for screening for malignant neoplasm of prostate: Secondary | ICD-10-CM | POA: Diagnosis not present

## 2021-06-17 LAB — COMPREHENSIVE METABOLIC PANEL
ALT: 22 U/L (ref 0–53)
AST: 16 U/L (ref 0–37)
Albumin: 4.2 g/dL (ref 3.5–5.2)
Alkaline Phosphatase: 75 U/L (ref 39–117)
BUN: 18 mg/dL (ref 6–23)
CO2: 28 mEq/L (ref 19–32)
Calcium: 9.4 mg/dL (ref 8.4–10.5)
Chloride: 105 mEq/L (ref 96–112)
Creatinine, Ser: 0.98 mg/dL (ref 0.40–1.50)
GFR: 83.51 mL/min (ref >60.00)
Glucose, Bld: 111 mg/dL — ABNORMAL HIGH (ref 70–99)
Potassium: 4.1 mEq/L (ref 3.5–5.1)
Sodium: 140 mEq/L (ref 135–145)
Total Bilirubin: 0.6 mg/dL (ref 0.2–1.2)
Total Protein: 6.7 g/dL (ref 6.0–8.3)

## 2021-06-17 LAB — LIPID PANEL
Cholesterol: 191 mg/dL (ref 0–200)
HDL: 55.6 mg/dL (ref >39.00)
LDL Cholesterol: 118 mg/dL — ABNORMAL HIGH (ref 0–99)
NonHDL: 135.6
Total CHOL/HDL Ratio: 3
Triglycerides: 88 mg/dL (ref 0.0–149.0)
VLDL: 17.6 mg/dL (ref 0.0–40.0)

## 2021-06-17 LAB — PSA: PSA: 1.45 ng/mL (ref 0.10–4.00)

## 2021-06-17 LAB — VITAMIN B12: Vitamin B-12: 1013 pg/mL — ABNORMAL HIGH (ref 211–911)

## 2021-06-17 NOTE — Telephone Encounter (Signed)
Derrick Horn called in due to Derrick Horn was here this morning for labs and when he came home his arm is red, itchy, swollen.

## 2021-06-17 NOTE — Telephone Encounter (Signed)
Mr. Mah called and stated that his arm has went down and he didn't need the appointment anymore.

## 2021-06-17 NOTE — Telephone Encounter (Signed)
Noted! Thank you

## 2021-06-17 NOTE — Telephone Encounter (Signed)
Pts arm where had blood draw done earlier this morning has red itchy rash about the size of 3/4 of arm going up and down arm and there is swelling also. Where drew blood there is a bubble the size of a nectarine. No swelling in neck, throat, mouth lips or tongue and no difficulty breathing. Never happened before with blood draw and does not think pt allergic to tape. Dr Darnell Level said cool compresses, take benadryl if can tolerate and Dr Darnell Level can see pt at [facility]. If better and not need to be seen pt can cancel 4 PM appt. Mrs Sevey notified as instructed and voiced understanding. Appt scheduled with UC & ED precautions. Sending FYI to Dr Baldwin Crown CMA.

## 2021-06-24 ENCOUNTER — Other Ambulatory Visit: Payer: Self-pay

## 2021-06-24 ENCOUNTER — Ambulatory Visit: Payer: BC Managed Care – PPO | Admitting: Family Medicine

## 2021-06-24 ENCOUNTER — Encounter: Payer: Self-pay | Admitting: Family Medicine

## 2021-06-24 ENCOUNTER — Ambulatory Visit (INDEPENDENT_AMBULATORY_CARE_PROVIDER_SITE_OTHER): Payer: BC Managed Care – PPO | Admitting: Family Medicine

## 2021-06-24 VITALS — BP 118/78 | HR 56 | Temp 98.0°F | Ht 69.0 in | Wt 191.0 lb

## 2021-06-24 DIAGNOSIS — G4733 Obstructive sleep apnea (adult) (pediatric): Secondary | ICD-10-CM

## 2021-06-24 DIAGNOSIS — I771 Stricture of artery: Secondary | ICD-10-CM

## 2021-06-24 DIAGNOSIS — Z1211 Encounter for screening for malignant neoplasm of colon: Secondary | ICD-10-CM | POA: Diagnosis not present

## 2021-06-24 DIAGNOSIS — E785 Hyperlipidemia, unspecified: Secondary | ICD-10-CM

## 2021-06-24 DIAGNOSIS — Z87891 Personal history of nicotine dependence: Secondary | ICD-10-CM

## 2021-06-24 DIAGNOSIS — Z0000 Encounter for general adult medical examination without abnormal findings: Secondary | ICD-10-CM | POA: Diagnosis not present

## 2021-06-24 DIAGNOSIS — K219 Gastro-esophageal reflux disease without esophagitis: Secondary | ICD-10-CM

## 2021-06-24 DIAGNOSIS — E538 Deficiency of other specified B group vitamins: Secondary | ICD-10-CM

## 2021-06-24 DIAGNOSIS — N400 Enlarged prostate without lower urinary tract symptoms: Secondary | ICD-10-CM

## 2021-06-24 DIAGNOSIS — Z23 Encounter for immunization: Secondary | ICD-10-CM

## 2021-06-24 DIAGNOSIS — E663 Overweight: Secondary | ICD-10-CM

## 2021-06-24 DIAGNOSIS — R413 Other amnesia: Secondary | ICD-10-CM

## 2021-06-24 MED ORDER — CYANOCOBALAMIN 500 MCG PO TABS: 500.0000 ug | ORAL_TABLET | Freq: Every day | ORAL | Status: DC

## 2021-06-24 MED ORDER — ESOMEPRAZOLE MAGNESIUM 20 MG PO CPDR: 40.0000 mg | DELAYED_RELEASE_CAPSULE | Freq: Every day | ORAL | Status: DC

## 2021-06-24 MED ORDER — MULTIVITAMIN ADULT PO TABS: 1.0000 | ORAL_TABLET | Freq: Every day | ORAL | Status: AC

## 2021-06-24 MED ORDER — PREVAGEN 10 MG PO CAPS: 1.0000 | ORAL_CAPSULE | Freq: Every day | ORAL | Status: DC

## 2021-06-24 NOTE — Assessment & Plan Note (Signed)
Levels elevated - will drop b12 dose to 590mcg daily.

## 2021-06-24 NOTE — Assessment & Plan Note (Signed)
Regular with CPAP - will refer back to neurology.

## 2021-06-24 NOTE — Assessment & Plan Note (Signed)
Discussed weight. Congratulated on 10 lb weight loss over the past year.

## 2021-06-24 NOTE — Assessment & Plan Note (Signed)
Stable period on esomeprazole 20mg  OTC 2 tab daily

## 2021-06-24 NOTE — Assessment & Plan Note (Signed)
Stable period s/p TURP 

## 2021-06-24 NOTE — Assessment & Plan Note (Signed)
Preventative protocols reviewed and updated unless pt declined. Discussed healthy diet and lifestyle.  

## 2021-06-24 NOTE — Assessment & Plan Note (Signed)
Incidental finding on chest imaging.

## 2021-06-24 NOTE — Progress Notes (Signed)
Patient ID: Derrick Horn, male    DOB: 1961-09-18, 60 y.o.   MRN: [id number]  This visit was conducted in person.  BP 118/78   Pulse (!) 56   Temp 98 F (36.7 C) (Temporal)   Ht 5\' 9"  (1.753 m)   Wt 191 lb (86.6 kg)   SpO2 97%   BMI 28.21 kg/m    CC: CPE Subjective:   HPI: Derrick Horn is a 60 y.o. male presenting on 06/24/2021 for Annual Exam   Fmhx CAD - father with stent and CABG Coronary CTA score of zero 07/2020 Echocardiogram normal 07/2020 COVID-19 infection 03/2021 - symptoms fully recovered. Does note short term memory mental fogginess. Did have some memory concerns even prior, on prevagen for this. Saw neurology previously - did improve with starting CPAP 11/2018 for OSA ([place]).  Continues sales.   Preventative: COLONOSCOPY Date: 09/2010 2 hyperplastic polyps rpt 10 yrs [name]) Prostate - yearly screening with PSA. S/p green light laser TURP for BPH (2017)  Lung cancer screening - ~20 PY hx - will refer Flu shot - yearly [place] 03/2020, 04/2020, booster 12/2020 Tdap 2011, consider updating Shingrix - discussed - will start Seat belt use discussed Sunscreen use discussed. No changing moles on skin.  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 Activity: shooting, yardwork, no regular exercise Diet: more salads, more oatmeal, good water     Relevant past medical, surgical, family and social history reviewed and updated as indicated. Interim medical history since our last visit reviewed. Allergies and medications reviewed and updated. Outpatient Medications Prior to Visit  Medication Sig Dispense Refill   aspirin EC 81 MG tablet Take 1 tablet (81 mg total) by mouth daily.     Cyanocobalamin (B-12) 1000 MCG SUBL Place 1 tablet under the tongue daily.     guaiFENesin-codeine  (ROBITUSSIN AC) 100-10 MG/5ML syrup Take 5-10 mLs by mouth at bedtime as needed for cough. 180 mL 0   hydrOXYzine (ATARAX/VISTARIL) 10 MG tablet TAKE 0.5-1 TABLETS (5-10 MG TOTAL) BY MOUTH 2 (TWO) TIMES DAILY AS NEEDED FOR ANXIETY. (Patient not taking: Reported on 06/24/2021) 180 tablet 1   omeprazole (PRILOSEC) 40 MG capsule TAKE 1 CAPSULE (40 MG TOTAL) BY MOUTH DAILY. DAILY FOR 3 WEEKS THEN AS NEEDED (Patient not taking: Reported on 06/24/2021) 30 capsule 3   No facility-administered medications prior to visit.     Per HPI unless specifically indicated in ROS section below Review of Systems  Constitutional:  Negative for activity change, appetite change, chills, fatigue, fever and unexpected weight change.  HENT:  Negative for hearing loss.   Eyes:  Negative for visual disturbance.  Respiratory:  Negative for cough, chest tightness, shortness of breath and wheezing.   Cardiovascular:  Negative for chest pain, palpitations and leg swelling.  Gastrointestinal:  Negative for abdominal distention, abdominal pain, blood in stool, constipation, diarrhea, nausea and vomiting.  Genitourinary:  Negative for difficulty urinating and hematuria.  Musculoskeletal:  Negative for arthralgias, myalgias and neck pain.  Skin:  Negative for rash.  Neurological:  Negative for dizziness, seizures, syncope and headaches.  Hematological:  Negative for adenopathy. Does not bruise/bleed easily.  Psychiatric/Behavioral:  Negative for dysphoric mood. The patient is not nervous/anxious.    Objective:  BP 118/78   Pulse Marland Kitchen)  56   Temp 98 F (36.7 C) (Temporal)   Ht 5\' 9"  (1.753 m)   Wt 191 lb (86.6 kg)   SpO2 97%   BMI 28.21 kg/m   Wt Readings from Last 3 Encounters:  06/24/21 191 lb (86.6 kg)  04/08/21 190 lb (86.2 kg)  09/08/20 201 lb (91.2 kg)      Physical Exam Vitals and nursing note reviewed.  Constitutional:      General: He is not in acute distress.    Appearance: Normal appearance. He is  well-developed. He is not ill-appearing.  HENT:     Head: Normocephalic and atraumatic.     Right Ear: Hearing, tympanic membrane, ear canal and external ear normal.     Left Ear: Hearing, tympanic membrane, ear canal and external ear normal.  Eyes:     General: No scleral icterus.    Extraocular Movements: Extraocular movements intact.     Conjunctiva/sclera: Conjunctivae normal.     Pupils: Pupils are equal, round, and reactive to light.  Neck:     Thyroid: No thyroid mass or thyromegaly.  Cardiovascular:     Rate and Rhythm: Normal rate and regular rhythm.     Pulses: Normal pulses.          Radial pulses are 2+ on the right side and 2+ on the left side.     Heart sounds: Normal heart sounds. No murmur heard. Pulmonary:     Effort: Pulmonary effort is normal. No respiratory distress.     Breath sounds: Normal breath sounds. No wheezing, rhonchi or rales.  Abdominal:     General: Bowel sounds are normal. There is no distension.     Palpations: Abdomen is soft. There is no mass.     Tenderness: There is no abdominal tenderness. There is no guarding or rebound.     Hernia: No hernia is present.  Musculoskeletal:        General: Normal range of motion.     Cervical back: Normal range of motion and neck supple.     Right lower leg: No edema.     Left lower leg: No edema.  Lymphadenopathy:     Cervical: No cervical adenopathy.  Skin:    General: Skin is warm and dry.     Findings: No rash.  Neurological:     General: No focal deficit present.     Mental Status: He is alert and oriented to person, place, and time.  Psychiatric:        Mood and Affect: Mood normal.        Behavior: Behavior normal.        Thought Content: Thought content normal.        Judgment: Judgment normal.      Results for orders placed or performed in visit on 06/17/21  PSA  Result Value Ref Range   PSA 1.45 0.10 - 4.00 ng/mL  Comprehensive metabolic panel  Result Value Ref Range   Sodium 140 135  - 145 mEq/L   Potassium 4.1 3.5 - 5.1 mEq/L   Chloride 105 96 - 112 mEq/L   CO2 28 19 - 32 mEq/L   Glucose, Bld 111 (H) 70 - 99 mg/dL   BUN 18 6 - 23 mg/dL   Creatinine, Ser 0.98 0.40 - 1.50 mg/dL   Total Bilirubin 0.6 0.2 - 1.2 mg/dL   Alkaline Phosphatase 75 39 - 117 U/L   AST 16 0 - 37 U/L   ALT 22 0 -  53 U/L   Total Protein 6.7 6.0 - 8.3 g/dL   Albumin 4.2 3.5 - 5.2 g/dL   GFR 83.51 >60.00 mL/min   Calcium 9.4 8.4 - 10.5 mg/dL  Lipid panel  Result Value Ref Range   Cholesterol 191 0 - 200 mg/dL   Triglycerides 88.0 0.0 - 149.0 mg/dL   HDL 55.60 >39.00 mg/dL   VLDL 17.6 0.0 - 40.0 mg/dL   LDL Cholesterol 118 (H) 0 - 99 mg/dL   Total CHOL/HDL Ratio 3    NonHDL 135.60   Vitamin B12  Result Value Ref Range   Vitamin B-12 1,013 (H) 211 - 911 pg/mL    Assessment & Plan:  This visit occurred during the SARS-CoV-2 public health emergency.  Safety protocols were in place, including screening questions prior to the visit, additional usage of staff PPE, and extensive cleaning of exam room while observing appropriate contact time as indicated for disinfecting solutions.   Problem List Items Addressed This Visit     Overweight    Discussed weight. Congratulated on 10 lb weight loss over the past year.        Ex-smoker    Fully quit 02/2020. Will refer for lung cancer screening discussion.        Relevant Orders   Ambulatory Referral for Lung Cancer Scre   GERD    Stable period on esomeprazole 20mg  OTC 2 tab daily       Relevant Medications   esomeprazole (NEXIUM) 20 MG capsule   Healthcare maintenance - Primary    Preventative protocols reviewed and updated unless pt declined. Discussed healthy diet and lifestyle.        Dyslipidemia    Chronic, stable period off statin. Reviewed diet choices to improve LDL  The 10-year ASCVD risk score Mikey Bussing DC Brooke Bonito., et al., 2013) is: 7.3%   Values used to calculate the score:     Age: 54 years     Sex: Male     Is Non-Hispanic  African American: No     Diabetic: No     Tobacco smoker: No     Systolic Blood Pressure: 413 mmHg     Is BP treated: No     HDL Cholesterol: 55.6 mg/dL     Total Cholesterol: 191 mg/dL        BPH (benign prostatic hyperplasia)    Stable period s/p TURP        Memory loss    Thought OSA related. Will refer back to neuro as overdue       OSA (obstructive sleep apnea)    Regular with CPAP - will refer back to neurology.        Relevant Orders   Ambulatory referral to Neurology   B12 deficiency    Levels elevated - will drop b12 dose to 558mcg daily.        Tortuous aorta (HCC)    Incidental finding on chest imaging.        Other Visit Diagnoses     Special screening for malignant neoplasms, colon       Relevant Orders   Ambulatory referral to Gastroenterology   Need for shingles vaccine       Relevant Orders   Varicella-zoster vaccine IM (Shingrix) (Completed)        Meds ordered this encounter  Medications   vitamin B-12 (CYANOCOBALAMIN) 500 MCG tablet    Sig: Take 1 tablet (500 mcg total) by mouth daily.   Multiple Vitamin (MULTIVITAMIN ADULT) TABS  Sig: Take 1 tablet by mouth daily.   Apoaequorin (PREVAGEN) 10 MG CAPS    Sig: Take 1 capsule by mouth daily.   esomeprazole (NEXIUM) 20 MG capsule    Sig: Take 2 capsules (40 mg total) by mouth daily at 12 noon.    Orders Placed This Encounter  Procedures   Varicella-zoster vaccine IM (Shingrix)   Ambulatory referral to Gastroenterology    Referral Priority:   Routine    Referral Type:   Consultation    Referral Reason:   Specialty Services Required    Number of Visits Requested:   1   Ambulatory referral to Neurology    Referral Priority:   Routine    Referral Type:   Consultation    Referral Reason:   Specialty Services Required    Requested Specialty:   Neurology    Number of Visits Requested:   1   Ambulatory Referral for Lung Cancer Scre    Referral Priority:   Routine    Referral  Type:   Consultation    Referral Reason:   Specialty Services Required    Number of Visits Requested:   1     Patient instructions: First shingrix vaccine today. Return in 2-6 months for nurse visit to complete series.  We will refer you back to Dr Brett Fairy for follow up of sleep apnea. Continue CPAP machine.  We will refer you to lung cancer screening program.  We will refer you to Dr Celesta Aver office  Reviewed 4 core lifestyle modifications to support a healthy mind:  1. Nutritious well balance diet.  2. Regular physical activity routine.  3. Regular mental activity such as reading books, word puzzles, math puzzles, jigsaw puzzles.  4. Social engagement.  Also ensure good blood pressure control, limit alcohol, no smoking.    Follow up plan: Return in about 1 year (around 06/24/2022) for annual exam, prior fasting for blood work.  Ria Bush, MD

## 2021-06-24 NOTE — Assessment & Plan Note (Signed)
Thought OSA related. Will refer back to neuro as overdue

## 2021-06-24 NOTE — Patient Instructions (Addendum)
First shingrix vaccine today. Return in 2-6 months for nurse visit to complete series.  We will refer you back to Dr Brett Fairy for follow up of sleep apnea. Continue CPAP machine.  We will refer you to lung cancer screening program.  We will refer you to Dr Celesta Aver office  4 core lifestyle modifications to support a healthy mind:  1. Nutritious well balance diet.  2. Regular physical activity routine.  3. Regular mental activity such as reading books, word puzzles, math puzzles, jigsaw puzzles.  4. Social engagement.  Also ensure good blood pressure control, limit alcohol, no smoking.    Health Maintenance, Male Adopting a healthy lifestyle and getting preventive care are important in promoting health and wellness. Ask your health care provider about: The right schedule for you to have regular tests and exams. Things you can do on your own to prevent diseases and keep yourself healthy. What should I know about diet, weight, and exercise? Eat a healthy diet  Eat a diet that includes plenty of vegetables, fruits, low-fat dairy products, and lean protein. Do not eat a lot of foods that are high in solid fats, added sugars, or sodium.  Maintain a healthy weight Body mass index (BMI) is a measurement that can be used to identify possible weight problems. It estimates body fat based on height and weight. Your health care provider can help determine your BMI and help you achieve or maintain ahealthy weight. Get regular exercise Get regular exercise. This is one of the most important things you can do for your health. Most adults should: Exercise for at least 150 minutes each week. The exercise should increase your heart rate and make you sweat (moderate-intensity exercise). Do strengthening exercises at least twice a week. This is in addition to the moderate-intensity exercise. Spend less time sitting. Even light physical activity can be beneficial. Watch cholesterol and blood lipids Have  your blood tested for lipids and cholesterol at 61 years of age, then havethis test every 5 years. You may need to have your cholesterol levels checked more often if: Your lipid or cholesterol levels are high. You are older than 61 years of age. You are at high risk for heart disease. What should I know about cancer screening? Many types of cancers can be detected early and may often be prevented. Depending on your health history and family history, you may need to have cancer screening at various ages. This may include screening for: Colorectal cancer. Prostate cancer. Skin cancer. Lung cancer. What should I know about heart disease, diabetes, and high blood pressure? Blood pressure and heart disease High blood pressure causes heart disease and increases the risk of stroke. This is more likely to develop in people who have high blood pressure readings, are of African descent, or are overweight. Talk with your health care provider about your target blood pressure readings. Have your blood pressure checked: Every 3-5 years if you are 15-2 years of age. Every year if you are 91 years old or older. If you are between the ages of 37 and 80 and are a current or former smoker, ask your health care provider if you should have a one-time screening for abdominal aortic aneurysm (AAA). Diabetes Have regular diabetes screenings. This checks your fasting blood sugar level. Have the screening done: Once every three years after age 65 if you are at a normal weight and have a low risk for diabetes. More often and at a younger age if you are overweight or  have a high risk for diabetes. What should I know about preventing infection? Hepatitis B If you have a higher risk for hepatitis B, you should be screened for this virus. Talk with your health care provider to find out if you are at risk forhepatitis B infection. Hepatitis C Blood testing is recommended for: Everyone born from [birth year] through  1966. Anyone with known risk factors for hepatitis C. Sexually transmitted infections (STIs) You should be screened each year for STIs, including gonorrhea and chlamydia, if: You are sexually active and are younger than 61 years of age. You are older than 61 years of age and your health care provider tells you that you are at risk for this type of infection. Your sexual activity has changed since you were last screened, and you are at increased risk for chlamydia or gonorrhea. Ask your health care provider if you are at risk. Ask your health care provider about whether you are at high risk for HIV. Your health care provider may recommend a prescription medicine to help prevent HIV infection. If you choose to take medicine to prevent HIV, you should first get tested for HIV. You should then be tested every 3 months for as long as you are taking the medicine. Follow these instructions at home: Lifestyle Do not use any products that contain nicotine or tobacco, such as cigarettes, e-cigarettes, and chewing tobacco. If you need help quitting, ask your health care provider. Do not use street drugs. Do not share needles. Ask your health care provider for help if you need support or information about quitting drugs. Alcohol use Do not drink alcohol if your health care provider tells you not to drink. If you drink alcohol: Limit how much you have to 0-2 drinks a day. Be aware of how much alcohol is in your drink. In the [place] one drink equals one 12 oz bottle of beer (355 mL), one 5 oz glass of wine (148 mL), or one 1 oz glass of hard liquor (44 mL). General instructions Schedule regular health, dental, and eye exams. Stay current with your vaccines. Tell your health care provider if: You often feel depressed. You have ever been abused or do not feel safe at home. Summary Adopting a healthy lifestyle and getting preventive care are important in promoting health and wellness. Follow your health  care provider's instructions about healthy diet, exercising, and getting tested or screened for diseases. Follow your health care provider's instructions on monitoring your cholesterol and blood pressure. This information is not intended to replace advice given to you by your health care provider. Make sure you discuss any questions you have with your healthcare provider. Document Revised: 11/20/2018 Document Reviewed: 11/20/2018 Elsevier Patient Education  2022 [employer].

## 2021-06-24 NOTE — Assessment & Plan Note (Signed)
Chronic, stable period off statin. Reviewed diet choices to improve LDL  The 10-year ASCVD risk score Mikey Bussing DC Brooke Bonito., et al., 2013) is: 7.3%   Values used to calculate the score:     Age: 60 years     Sex: Male     Is Non-Hispanic African American: No     Diabetic: No     Tobacco smoker: No     Systolic Blood Pressure: 290 mmHg     Is BP treated: No     HDL Cholesterol: 55.6 mg/dL     Total Cholesterol: 191 mg/dL

## 2021-06-24 NOTE — Assessment & Plan Note (Signed)
Fully quit 02/2020. Will refer for lung cancer screening discussion.

## 2021-07-14 ENCOUNTER — Other Ambulatory Visit: Payer: Self-pay | Admitting: *Deleted

## 2021-07-14 ENCOUNTER — Encounter: Payer: Self-pay | Admitting: Internal Medicine

## 2021-07-14 DIAGNOSIS — Z87891 Personal history of nicotine dependence: Secondary | ICD-10-CM

## 2021-09-01 ENCOUNTER — Ambulatory Visit (AMBULATORY_SURGERY_CENTER): Payer: BC Managed Care – PPO

## 2021-09-01 ENCOUNTER — Encounter: Payer: Self-pay | Admitting: Internal Medicine

## 2021-09-01 VITALS — Ht 69.0 in | Wt 185.0 lb

## 2021-09-01 DIAGNOSIS — Z1211 Encounter for screening for malignant neoplasm of colon: Secondary | ICD-10-CM

## 2021-09-01 NOTE — Progress Notes (Signed)
No egg or soy allergy known to patient  No issues known to pt with past sedation with any surgeries or procedures Patient denies ever being told they had issues or difficulty with intubation  No FH of Malignant Hyperthermia Pt is not on diet pills Pt is not on  home 02  Pt is not on blood thinners  Pt denies issues with constipation  No A fib or A flutter  EMMI video to pt or via [place] 19 guidelines implemented in PV today with Pt and RN   Pt is fully vaccinated  for Covid   Virtual previsit  miralax   Due to the COVID-19 pandemic we are asking patients to follow certain guidelines.  Pt aware of COVID protocols and [facility] guidelines

## 2021-09-07 ENCOUNTER — Ambulatory Visit
Admission: RE | Admit: 2021-09-07 | Discharge: 2021-09-07 | Disposition: A | Discharge status: Home / Self Care | Payer: BC Managed Care – PPO | Source: Ambulatory Visit | Attending: Acute Care | Admitting: Acute Care

## 2021-09-07 ENCOUNTER — Telehealth (INDEPENDENT_AMBULATORY_CARE_PROVIDER_SITE_OTHER): Payer: BC Managed Care – PPO | Admitting: Acute Care

## 2021-09-07 ENCOUNTER — Encounter: Payer: Self-pay | Admitting: Acute Care

## 2021-09-07 ENCOUNTER — Other Ambulatory Visit: Payer: Self-pay

## 2021-09-07 DIAGNOSIS — Z87891 Personal history of nicotine dependence: Secondary | ICD-10-CM

## 2021-09-07 NOTE — Progress Notes (Signed)
Virtual Visit via Video Note  I connected with Derrick Horn on 09/07/21 at  9:00 AM EDT by a video enabled telemedicine application and verified that I am speaking with the correct person using two identifiers.  Location: Patient: At home Provider: [place], [place], [place], [address]    I discussed the limitations of evaluation and management by telemedicine and the availability of in person appointments. The patient expressed understanding and agreed to proceed.  Shared Decision Making Visit Lung Cancer Screening Program [phone])   Eligibility: Age 60 y.o. Pack Years Smoking History Calculation 33 pack year smoking history (# packs/per year x # years smoked) Recent History of coughing up blood  no Unexplained weight loss? no ( >Than 15 pounds within the last 6 months ) Prior History Lung / other cancer no (Diagnosis within the last 5 years already requiring surveillance chest CT Scans). Smoking Status Former Smoker Former Smokers: Years since quit: 1 year  Quit Date: 03/2020  Visit Components: Discussion included one or more decision making aids. yes Discussion included risk/benefits of screening. yes Discussion included potential follow up diagnostic testing for abnormal scans. yes Discussion included meaning and risk of over diagnosis. yes Discussion included meaning and risk of False Positives. yes Discussion included meaning of total radiation exposure. yes  Counseling Included: Importance of adherence to annual lung cancer LDCT screening. yes Impact of comorbidities on ability to participate in the program. yes Ability and willingness to under diagnostic treatment. yes  Smoking Cessation Counseling: Current Smokers:  Discussed importance of smoking cessation. yes Information about tobacco cessation classes and interventions provided to patient. yes Patient provided with "ticket" for LDCT Scan. yes Symptomatic Patient. no  Counseling Diagnosis Code:  Tobacco Use Z72.0 Asymptomatic Patient yes  Counseling (Intermediate counseling: > three minutes counseling) H4193 Former Smokers:  Discussed the importance of maintaining cigarette abstinence. yes Diagnosis Code: Personal History of Nicotine Dependence. X90.240 Information about tobacco cessation classes and interventions provided to patient. Yes Patient provided with "ticket" for LDCT Scan. yes Written Order for Lung Cancer Screening with LDCT placed in Epic. Yes (CT Chest Lung Cancer Screening Low Dose W/O CM) [id number] Z12.2-Screening of respiratory organs Z87.891-Personal history of nicotine dependence  I spent 25 minutes of face to face time with Derrick Horn  discussing the risks and benefits of lung cancer screening. We viewed a power point together that explained in detail the above noted topics. We took the time to pause the power point at intervals to allow for questions to be asked and answered to ensure understanding. We discussed that he had taken the single most powerful action possible to decrease his risk of developing lung cancer when he quit smoking. I counseled him to remain smoke free, and to contact me if he ever had the desire to smoke again so that I can provide resources and tools to help support the effort to remain smoke free. We discussed the time and location of the scan, and that either  Doroteo Glassman RN or I will call with the results within  24-48 hours of receiving them. He has my card and contact information in the event he needs to speak with me, in addition to a copy of the power point we reviewed as a resource. He verbalized understanding of all of the above and had no further questions upon leaving the office.     I explained to the patient that there has been a high incidence of coronary artery disease noted  on these exams. I explained that this is a non-gated exam therefore degree or severity cannot be determined. This patient is not on statin therapy. I  have asked the patient to follow-up with their PCP regarding any incidental finding of coronary artery disease and management with diet or medication as they feel is clinically indicated. The patient verbalized understanding of the above and had no further questions.     Magdalen Spatz, NP 09/07/2021

## 2021-09-07 NOTE — Patient Instructions (Signed)
Thank you for participating in the [facility]. It was our pleasure to meet you today. We will call you with the results of your scan within the next few days. Your scan will be assigned a Lung RADS category score by the physicians reading the scans.  This Lung RADS score determines follow up scanning.  See below for description of categories, and follow up screening recommendations. We will be in touch to schedule your follow up screening annually or based on recommendations of our providers. We will fax a copy of your scan results to your Primary Care Physician, or the physician who referred you to the program, to ensure they have the results. Please call the office if you have any questions or concerns regarding your scanning experience or results.  Our office number is [phone]. Please speak with Denise Phelps, RN. She is our Lung Cancer Screening RN. If she is unavailable when you call, please have the office staff send her a message. She will return your call at her earliest convenience. Remember, if your scan is normal, we will scan you annually as long as you continue to meet the criteria for the program. (Age 55-77, Current smoker or smoker who has quit within the last 15 years). If you are a smoker, remember, quitting is the single most powerful action that you can take to decrease your risk of lung cancer and other pulmonary, breathing related problems. We know quitting is hard, and we are here to help.  Please let us know if there is anything we can do to help you meet your goal of quitting. If you are a former smoker, congratulations. We are proud of you! Remain smoke free! Remember you can refer friends or family members through the number above.  We will screen them to make sure they meet criteria for the program. Thank you for helping us take better care of you by participating in Lung Screening.  Lung RADS Categories:  Lung RADS 1: no nodules  or definitely non-concerning nodules.  Recommendation is for a repeat annual scan in 12 months.  Lung RADS 2:  nodules that are non-concerning in appearance and behavior with a very low likelihood of becoming an active cancer. Recommendation is for a repeat annual scan in 12 months.  Lung RADS 3: nodules that are probably non-concerning , includes nodules with a low likelihood of becoming an active cancer.  Recommendation is for a 6-month repeat screening scan. Often noted after an upper respiratory illness. We will be in touch to make sure you have no questions, and to schedule your 6-month scan.  Lung RADS 4 A: nodules with concerning findings, recommendation is most often for a follow up scan in 3 months or additional testing based on our provider's assessment of the scan. We will be in touch to make sure you have no questions and to schedule the recommended 3 month follow up scan.  Lung RADS 4 B:  indicates findings that are concerning. We will be in touch with you to schedule additional diagnostic testing based on our provider's  assessment of the scan.   

## 2021-09-13 ENCOUNTER — Telehealth: Payer: Self-pay | Admitting: Family Medicine

## 2021-09-13 NOTE — Telephone Encounter (Signed)
Pt wife called stating that Dr Darnell Level sent him for X-Ray on 09/07/21, report came in 09/13/21 stating Ascending Thoraicaortic Aneurysm. Pt wife asking is it ok to go to the Propofol Colon on 09/15/21 before the heart doctor on 09/16/21. Please advise.

## 2021-09-14 NOTE — Telephone Encounter (Signed)
Pt wife called in wanting to know if it was ok to start the process with colonoscopy . Did not have any other question .advise her Dr. Bonner Puna to proceed .

## 2021-09-14 NOTE — Telephone Encounter (Signed)
Patient notified as instructed by telephone and verbalized understanding. 

## 2021-09-14 NOTE — Telephone Encounter (Signed)
Ok to proceed with colonoscopy  

## 2021-09-15 ENCOUNTER — Other Ambulatory Visit: Payer: Self-pay

## 2021-09-15 ENCOUNTER — Ambulatory Visit (AMBULATORY_SURGERY_CENTER): Payer: BC Managed Care – PPO | Admitting: Internal Medicine

## 2021-09-15 ENCOUNTER — Encounter: Payer: Self-pay | Admitting: Internal Medicine

## 2021-09-15 VITALS — BP 128/59 | HR 42 | Temp 97.8°F | Resp 13 | Ht 69.0 in | Wt 185.0 lb

## 2021-09-15 DIAGNOSIS — D124 Benign neoplasm of descending colon: Secondary | ICD-10-CM | POA: Diagnosis not present

## 2021-09-15 DIAGNOSIS — D123 Benign neoplasm of transverse colon: Secondary | ICD-10-CM | POA: Diagnosis not present

## 2021-09-15 DIAGNOSIS — D125 Benign neoplasm of sigmoid colon: Secondary | ICD-10-CM

## 2021-09-15 DIAGNOSIS — Z1211 Encounter for screening for malignant neoplasm of colon: Secondary | ICD-10-CM | POA: Diagnosis not present

## 2021-09-15 HISTORY — PX: COLONOSCOPY WITH PROPOFOL: SHX5780

## 2021-09-15 MED ORDER — SODIUM CHLORIDE 0.9 % IV SOLN: 500.0000 mL | Freq: Once | INTRAVENOUS | Status: DC

## 2021-09-15 NOTE — Patient Instructions (Addendum)
I found and removed 6 small polyps.  I will let you know pathology results and when to have another routine colonoscopy by mail and/or My Chart.  I appreciate the opportunity to care for you. Gatha Mayer, MD, FACG YOU HAD AN ENDOSCOPIC PROCEDURE TODAY AT [facility]:   Refer to the procedure report that was given to you for any specific questions about what was found during the examination.  If the procedure report does not answer your questions, please call your gastroenterologist to clarify.  If you requested that your care partner not be given the details of your procedure findings, then the procedure report has been included in a sealed envelope for you to review at your convenience later.  YOU SHOULD EXPECT: Some feelings of bloating in the abdomen. Passage of more gas than usual.  Walking can help get rid of the air that was put into your GI tract during the procedure and reduce the bloating. If you had a lower endoscopy (such as a colonoscopy or flexible sigmoidoscopy) you may notice spotting of blood in your stool or on the toilet paper. If you underwent a bowel prep for your procedure, you may not have a normal bowel movement for a few days.  Please Note:  You might notice some irritation and congestion in your nose or some drainage.  This is from the oxygen used during your procedure.  There is no need for concern and it should clear up in a day or so.  SYMPTOMS TO REPORT IMMEDIATELY:  Following lower endoscopy (colonoscopy or flexible sigmoidoscopy):  Excessive amounts of blood in the stool  Significant tenderness or worsening of abdominal pains  Swelling of the abdomen that is new, acute  Fever of 100F or higher  For urgent or emergent issues, a gastroenterologist can be reached at any hour by calling [phone]. Do not use MyChart messaging for urgent concerns.    DIET:  We do recommend a small meal at first, but then you may proceed to your regular  diet.  Drink plenty of fluids but you should avoid alcoholic beverages for 24 hours.  ACTIVITY:  You should plan to take it easy for the rest of today and you should NOT DRIVE or use heavy machinery until tomorrow (because of the sedation medicines used during the test).    FOLLOW UP: Our staff will call the number listed on your records 48-72 hours following your procedure to check on you and address any questions or concerns that you may have regarding the information given to you following your procedure. If we do not reach you, we will leave a message.  We will attempt to reach you two times.  During this call, we will ask if you have developed any symptoms of COVID 19. If you develop any symptoms (ie: fever, flu-like symptoms, shortness of breath, cough etc.) before then, please call [phone].  If you test positive for Covid 19 in the 2 weeks post procedure, please call and report this information to [place].    If any biopsies were taken you will be contacted by phone or by letter within the next 1-3 weeks.  Please call us at [phone] if you have not heard about the biopsies in 3 weeks.    SIGNATURES/CONFIDENTIALITY: You and/or your care partner have signed paperwork which will be entered into your electronic medical record.  These signatures attest to the fact that that the information above on your After Visit Summary has  been reviewed and is understood.  Full responsibility of the confidentiality of this discharge information lies with you and/or your care-partner.  

## 2021-09-15 NOTE — Progress Notes (Signed)
Pt's states no medical or surgical changes since previsit or office visit.  Vitals DT 

## 2021-09-15 NOTE — Progress Notes (Signed)
Called to room to assist during endoscopic procedure.  Patient ID and intended procedure confirmed with present staff. Received instructions for my participation in the procedure from the performing physician.  

## 2021-09-15 NOTE — Progress Notes (Signed)
Report to PACU, RN, vss, BBS= Clear.  

## 2021-09-15 NOTE — Op Note (Addendum)
[place] Patient Name: Derrick Horn Procedure Date: 09/15/2021 11:15 AM MRN: [id number] Endoscopist: Gatha Mayer , MD Age: 60 Referring MD:  Date of Birth: [birth year] Gender: Male Account #: [id number] Procedure:                Colonoscopy Medicines:                Propofol per Anesthesia, Monitored Anesthesia Care Procedure:                Pre-Anesthesia Assessment:                           - Prior to the procedure, a History and Physical                            was performed, and patient medications and                            allergies were reviewed. The patient's tolerance of                            previous anesthesia was also reviewed. The risks                            and benefits of the procedure and the sedation                            options and risks were discussed with the patient.                            All questions were answered, and informed consent                            was obtained. Prior Anticoagulants: The patient has                            taken no previous anticoagulant or antiplatelet                            agents. ASA Grade Assessment: II - A patient with                            mild systemic disease. After reviewing the risks                            and benefits, the patient was deemed in                            satisfactory condition to undergo the procedure.                           After obtaining informed consent, the colonoscope                            was passed under direct vision. Throughout  the                            procedure, the patient's blood pressure, pulse, and                            oxygen saturations were monitored continuously. The                            CF [id number] #[id number] was introduced through the anus                            and advanced to the the cecum, identified by                            appendiceal orifice and ileocecal valve. The                             colonoscopy was performed without difficulty. The                            patient tolerated the procedure well. The quality                            of the bowel preparation was good. The ileocecal                            valve, appendiceal orifice, and rectum were                            photographed. The bowel preparation used was                            Miralax via split dose instruction. Scope In: 11:30:12 AM Scope Out: 11:49:08 AM Scope Withdrawal Time: 0 hours 15 minutes 33 seconds  Total Procedure Duration: 0 hours 18 minutes 56 seconds  Findings:                 The perianal and digital rectal examinations were                            normal. Pertinent negatives include normal prostate                            (size, shape, and consistency).                           Six sessile polyps were found in the sigmoid colon,                            descending colon and transverse colon. The polyps                            were diminutive in size. These polyps were removed  with a cold snare. Resection and retrieval were                            complete. Verification of patient identification                            for the specimen was done. Estimated blood loss was                            minimal.                           Multiple diverticula were found in the sigmoid                            colon.                           External and internal hemorrhoids were found.                           The exam was otherwise without abnormality on                            direct and retroflexion views. Complications:            No immediate complications. Estimated Blood Loss:     Estimated blood loss was minimal. Impression:               - Six diminutive polyps in the sigmoid colon, in                            the descending colon and in the transverse colon,                            removed with a cold snare. Resected  and retrieved.                           - Diverticulosis in the sigmoid colon.                           - External and internal hemorrhoids.                           - The examination was otherwise normal on direct                            and retroflexion views. Recommendation:           - Patient has a contact number available for                            emergencies. The signs and symptoms of potential                            delayed complications were discussed with the  patient. Return to normal activities tomorrow.                            Written discharge instructions were provided to the                            patient.                           - Resume previous diet.                           - Continue present medications.                           - Repeat colonoscopy is recommended. The                            colonoscopy date will be determined after pathology                            results from today's exam become available for                            review. Gatha Mayer, MD 09/15/2021 12:03:04 PM This report has been signed electronically. Addendum Number: 1   Addendum Date: 10/09/2021 1:42:19 PM      INDICATION FOR PROCEDURE:      Colon cancer screening Gatha Mayer, MD 10/09/2021 1:42:44 PM This report has been signed electronically.

## 2021-09-15 NOTE — Progress Notes (Signed)
[facility] History and Physical   Primary Care Physician:  Ria Bush, MD   Reason for Procedure:   Colon cancer screening  Plan:    colonoscopy     HPI: Derrick Horn is a 60 y.o. male here for colon cancer screening   Past Medical History:  Diagnosis Date   Chronic LBP    COPD (chronic obstructive pulmonary disease) ([place])    minimal emphysema on CT scan   GERD (gastroesophageal reflux disease)    mild   Headache    silent migraine attack age 12's   History of chest pain 2010   stress test WNL   HLD (hyperlipidemia)    mild   Recurrent sinus infections    Sensorineural hearing loss (SNHL) of both ears 12/2015   mild-mod, no need for aides, rec melatoonin and lipoflavonoid [facility])   Sleep apnea 2020   CPap   Tear of left biceps muscle 01/19/2014    Past Surgical History:  Procedure Laterality Date   BICEPS TENDON REPAIR  2015   [name]   CARPAL TUNNEL RELEASE     right   chest CT  2011   no pulm nodules, mild apical scarring/emphysematous changes   COLONOSCOPY  09/2010   2 hyperplastic polyps [name])   COLONOSCOPY WITH PROPOFOL  09/15/2021   ETT  2010   WNL (Dr. Johnsie Cancel)   GREEN LIGHT LASER TURP (TRANSURETHRAL RESECTION OF PROSTATE N/A 05/30/2016   Procedure: GREEN LIGHT LASER TURP (TRANSURETHRAL RESECTION OF PROSTATE;  Surgeon: Royston Cowper, MD;  Location: [facility] ORS;  Service: Urology;  Laterality: N/A;   SHOULDER ARTHROSCOPY WITH SUBACROMIAL DECOMPRESSION, ROTATOR CUFF REPAIR AND BICEP TENDON REPAIR  04/2018   Dr Onnie Graham    Prior to Admission medications   Medication Sig Start Date End Date Taking? Authorizing Provider  Apoaequorin (PREVAGEN) 10 MG CAPS Take 1 capsule by mouth daily. 06/24/21  Yes Ria Bush, MD  aspirin EC 81 MG tablet Take 1 tablet (81 mg total) by mouth daily. 11/18/18  Yes Ria Bush, MD  esomeprazole (NEXIUM) 20 MG capsule Take 2 capsules (40 mg total) by mouth daily at 12 noon. 06/24/21  Yes  Ria Bush, MD  Multiple Vitamin (MULTIVITAMIN ADULT) TABS Take 1 tablet by mouth daily. 06/24/21  Yes Ria Bush, MD  vitamin B-12 (CYANOCOBALAMIN) 500 MCG tablet Take 1 tablet (500 mcg total) by mouth daily. 06/24/21  Yes Ria Bush, MD  polycarbophil (FIBERCON) 625 MG tablet Take 625 mg by mouth daily.    [provider]    Current Outpatient Medications  Medication Sig Dispense Refill   Apoaequorin (PREVAGEN) 10 MG CAPS Take 1 capsule by mouth daily.     aspirin EC 81 MG tablet Take 1 tablet (81 mg total) by mouth daily.     esomeprazole (NEXIUM) 20 MG capsule Take 2 capsules (40 mg total) by mouth daily at 12 noon.     Multiple Vitamin (MULTIVITAMIN ADULT) TABS Take 1 tablet by mouth daily.     vitamin B-12 (CYANOCOBALAMIN) 500 MCG tablet Take 1 tablet (500 mcg total) by mouth daily.     polycarbophil (FIBERCON) 625 MG tablet Take 625 mg by mouth daily.     Current Facility-Administered Medications  Medication Dose Route Frequency Provider Last Rate Last Admin   0.9 %  sodium chloride infusion  500 mL Intravenous Once Gatha Mayer, MD        Allergies as of 09/15/2021 - Review Complete 09/15/2021  Allergen Reaction Noted  Azithromycin Other (See Comments) 09/22/2010   Pyridium [phenazopyridine hcl]  11/18/2018    Family History  Problem Relation Age of Onset   Coronary artery disease Father 28       s/p stents, CABG   Prostate cancer Father 52       doing well   Cancer Maternal Grandmother        uterine   Coronary artery disease Maternal Grandfather 35   Stroke Maternal Grandfather    Diabetes Neg Hx    Colon cancer Neg Hx    Colon polyps Neg Hx    Esophageal cancer Neg Hx    Rectal cancer Neg Hx    Stomach cancer Neg Hx     Social History   Socioeconomic History   Marital status: Married    Spouse name: Not on file   Number of children: 1   Years of education: Not on file   Highest education level: Not on file  Occupational  History   Occupation: Sales  Tobacco Use   Smoking status: Former    Packs/day: 0.25    Years: 30.00    Pack years: 7.50    Types: Cigarettes    Quit date: 11/15/2018    Years since quitting: 2.8   Smokeless tobacco: Never  Vaping Use   Vaping Use: Never used  Substance and Sexual Activity   Alcohol use: Never   Drug use: Never   Sexual activity: Yes  Other Topics Concern     Social History Narrative   Caffeine: 1 cup coffee/day   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   1 dog   Activity: shooting, yardwork, no regular exercise   Diet: more salads, more oatmeal, good water      Review of Systems:  All other review of systems negative except as mentioned in the HPI.  Physical Exam: Vital signs BP 136/85   Pulse (!) 55   Temp 97.8 F (36.6 C) (Temporal)   Resp 13   Ht 5\' 9"  (1.753 m)   Wt 185 lb (83.9 kg)   SpO2 97%   BMI 27.32 kg/m   General:   Alert,  Well-developed, well-nourished, pleasant and cooperative in NAD Lungs:  Clear throughout to auscultation.   Heart:  Regular rate and rhythm; no murmurs, clicks, rubs,  or gallops. Abdomen:  Soft, nontender and nondistended. Normal bowel sounds.   Neuro/Psych:  Alert and cooperative. Normal mood and affect. A and O x 3   @Reannon Candella  Simonne Maffucci, MD, [facility] [phone] (pager) 09/15/2021 11:22 AM@

## 2021-09-16 ENCOUNTER — Encounter: Payer: Self-pay | Admitting: Cardiovascular Disease

## 2021-09-16 ENCOUNTER — Ambulatory Visit: Payer: BC Managed Care – PPO | Admitting: Cardiovascular Disease

## 2021-09-16 VITALS — BP 112/80 | HR 53 | Ht 69.0 in | Wt 191.0 lb

## 2021-09-16 DIAGNOSIS — I2510 Atherosclerotic heart disease of native coronary artery without angina pectoris: Secondary | ICD-10-CM | POA: Diagnosis not present

## 2021-09-16 DIAGNOSIS — I77810 Thoracic aortic ectasia: Secondary | ICD-10-CM | POA: Diagnosis not present

## 2021-09-16 MED ORDER — ROSUVASTATIN CALCIUM 10 MG PO TABS: 10.0000 mg | ORAL_TABLET | Freq: Every day | ORAL | 3 refills | Status: DC

## 2021-09-16 NOTE — Patient Instructions (Addendum)
Medication Instructions:  Your physician has recommended you make the following change in your medication:  1.) start rosuvastatin (Crestor) 10 mg - one tablet every other day  *If you need a refill on your cardiac medications before your next appointment, please call your pharmacy*   Lab Work: Please return in 3 months for fasting lipids/liver function   Testing/Procedures: Chest CTA in March 2023   Follow-Up: At [facility], you and your health needs are our priority.  As part of our continuing mission to provide you with exceptional heart care, we have created designated Provider Care Teams.  These Care Teams include your primary Cardiologist (physician) and Advanced Practice Providers (APPs -  Physician Assistants and Nurse Practitioners) who all work together to provide you with the care you need, when you need it.  Your next appointment:   12 month(s)  The format for your next appointment:   In Person  Provider:   You may see Lauree Chandler, MD or one of the following Advanced Practice Providers on your designated Care Team:   Melina Copa, PA-C Ermalinda Barrios, PA-C   Other Instructions

## 2021-09-16 NOTE — Progress Notes (Signed)
Chief Complaint  Patient presents with   Follow-up    CAD   History of Present Illness: 60 yo male with history of mild CAD, hyperlipidemia, sleep apnea, GERD and former tobacco abuse here today for cardiac follow up. Coronary artery CT in August 2021 with minimal LAD plaque, calcium score zero. Echo August 2021 with LVEF=60-65%. No valve disease. Mild dilation of ascending aorta by coronary CTA and stable by repeat non-contrast CT last week.   He is here today for follow up. The patient denies any chest pain, dyspnea, palpitations, lower extremity edema, orthopnea, PND, dizziness, near syncope or syncope.   Primary Care Physician: Ria Bush, MD  Past Medical History:  Diagnosis Date   Chronic LBP    COPD (chronic obstructive pulmonary disease) ([place])    minimal emphysema on CT scan   GERD (gastroesophageal reflux disease)    mild   Headache    silent migraine attack age 51's   History of chest pain 2010   stress test WNL   HLD (hyperlipidemia)    mild   Recurrent sinus infections    Sensorineural hearing loss (SNHL) of both ears 12/2015   mild-mod, no need for aides, rec melatoonin and lipoflavonoid [facility]NT)   Sleep apnea 2020   CPap   Tear of left biceps muscle 01/19/2014    Past Surgical History:  Procedure Laterality Date   BICEPS TENDON REPAIR  2015   [name]   CARPAL TUNNEL RELEASE     right   chest CT  2011   no pulm nodules, mild apical scarring/emphysematous changes   COLONOSCOPY  09/2010   2 hyperplastic polyps [name])   COLONOSCOPY WITH PROPOFOL  09/15/2021   ETT  2010   WNL (Dr. Johnsie Cancel)   GREEN LIGHT LASER TURP (TRANSURETHRAL RESECTION OF PROSTATE N/A 05/30/2016   Procedure: GREEN LIGHT LASER TURP (TRANSURETHRAL RESECTION OF PROSTATE;  Surgeon: Royston Cowper, MD;  Location: [facility] ORS;  Service: Urology;  Laterality: N/A;   SHOULDER ARTHROSCOPY WITH SUBACROMIAL DECOMPRESSION, ROTATOR CUFF REPAIR AND BICEP TENDON REPAIR  04/2018   Dr Onnie Graham     Current Outpatient Medications  Medication Sig Dispense Refill   aspirin EC 81 MG tablet Take 1 tablet (81 mg total) by mouth daily.     esomeprazole (NEXIUM) 20 MG capsule Take 2 capsules (40 mg total) by mouth daily at 12 noon.     Multiple Vitamin (MULTIVITAMIN ADULT) TABS Take 1 tablet by mouth daily.     polycarbophil (FIBERCON) 625 MG tablet Take 625 mg by mouth daily.     rosuvastatin (CRESTOR) 10 MG tablet Take 1 tablet (10 mg total) by mouth daily. 90 tablet 3   vitamin B-12 (CYANOCOBALAMIN) 500 MCG tablet Take 1 tablet (500 mcg total) by mouth daily.     Apoaequorin (PREVAGEN) 10 MG CAPS Take 1 capsule by mouth daily. (Patient not taking: Reported on 09/16/2021)     No current facility-administered medications for this visit.    Allergies  Allergen Reactions   Azithromycin Other (See Comments)    bradycardia, hypotension,syncope, 3 days in hospital   Pyridium [Phenazopyridine Hcl]     Nausea/vomiting    Social History   Socioeconomic History   Marital status: Married    Spouse name: Not on file   Number of children: 1   Years of education: Not on file   Highest education level: Not on file  Occupational History   Occupation: Sales  Tobacco Use   Smoking status: Former  Packs/day: 0.25    Years: 30.00    Pack years: 7.50    Types: Cigarettes    Quit date: 11/15/2018    Years since quitting: 2.8   Smokeless tobacco: Never  Vaping Use   Vaping Use: Never used  Substance and Sexual Activity   Alcohol use: Never   Drug use: Never   Sexual activity: Yes  Other Topics Concern   Not on file  Social History Narrative   Caffeine: 1 cup coffee/day   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   1 dog   Activity: shooting, yardwork, no regular exercise   Diet: more salads, more oatmeal, good water   Social Determinants of Health   Financial Resource Strain: Not on file  Food Insecurity: Not on file   Transportation Needs: Not on file  Physical Activity: Not on file  Stress: Not on file  Social Connections: Not on file  Intimate Partner Violence: Not on file    Family History  Problem Relation Age of Onset   Coronary artery disease Father 64       s/p stents, CABG   Prostate cancer Father 57       doing well   Cancer Maternal Grandmother        uterine   Coronary artery disease Maternal Grandfather 35   Stroke Maternal Grandfather    Diabetes Neg Hx    Colon cancer Neg Hx    Colon polyps Neg Hx    Esophageal cancer Neg Hx    Rectal cancer Neg Hx    Stomach cancer Neg Hx     Review of Systems:  As stated in the HPI and otherwise negative.   BP 112/80   Pulse (!) 53   Ht 5\' 9"  (1.753 m)   Wt 191 lb (86.6 kg)   SpO2 97%   BMI 28.21 kg/m   Physical Examination: General: Well developed, well nourished, NAD  HEENT: OP clear, mucus membranes moist  SKIN: warm, dry. No rashes. Neuro: No focal deficits  Musculoskeletal: Muscle strength 5/5 all ext  Psychiatric: Mood and affect normal  Neck: No JVD, no carotid bruits, no thyromegaly, no lymphadenopathy.  Lungs:Clear bilaterally, no wheezes, rhonci, crackles Cardiovascular: Regular rate and rhythm. No murmurs, gallops or rubs. Abdomen:Soft. Bowel sounds present. Non-tender.  Extremities: No lower extremity edema. Pulses are 2 + in the bilateral DP/PT.  EKG:  EKG is  ordered today. The ekg ordered today demonstrates Sinus brady  Echo 07/21/20:   1. Left ventricular ejection fraction, by estimation, is 60 to 65%. The  left ventricle has normal function. The left ventricle has no regional  wall motion abnormalities. Left ventricular diastolic parameters were  normal.   2. Right ventricular systolic function is normal. The right ventricular  size is mildly enlarged. There is normal pulmonary artery systolic  pressure.   3. The mitral valve is normal in structure. No evidence of mitral valve  regurgitation. No  evidence of mitral stenosis.   4. The aortic valve is normal in structure. Aortic valve regurgitation is  not visualized. No aortic stenosis is present.   5. The inferior vena cava is normal in size with greater than 50%  respiratory variability, suggesting right atrial pressure of 3 mmHg.   Coronary CTA 08/04/20: Aorta: Borderline dilated ascending thoracic aorta at 55mm in the double oblique view at the level of the pulmonary artery bifurcation. Scattered calcifications in the ascending thoracic  aorta. No dissection.   Aortic Valve:  Trileaflet.  No calcifications.   Coronary Arteries:  Normal coronary origin.  Right dominance.   RCA is a large dominant artery that gives rise to PDA and PLVB. There is no plaque.   Left main is a large artery that gives rise to LAD and LCX arteries. There is no plaque.   LAD is a large vessel that gives rise to a very large branching diagonal. There is minimal sequential non-calcified plaque in the mid LAD after the takeoff of a large diagonal with associated stenosis of 0-24%.   LCX is a non-dominant artery that gives rise to one large OM1 branch. There is no plaque.   Other findings:   Normal pulmonary vein drainage into the left atrium.   Normal let atrial appendage without a thrombus.   Normal size of the pulmonary artery.   IMPRESSION: 1. Coronary calcium score of 0. This was 0 percentile for age and sex matched control.   2.  Normal coronary origin with right dominance.   3.  Minimal atherosclerosis of the mid LAD.  CAD RADS 1.   4.  Borderline dilated ascending thoracic aorta (28mm).   4.  Consider non-atherosclerotic causes of chest pain.  Recent Labs: 06/17/2021: ALT 22; BUN 18; Creatinine, Ser 0.98; Potassium 4.1; Sodium 140   Lipid Panel    Component Value Date/Time   CHOL 191 06/17/2021 0753   CHOL 221 06/03/2011 0000   TRIG 88.0 06/17/2021 0753   TRIG 83 06/03/2011 0000   HDL 55.60 06/17/2021 0753   CHOLHDL 3  06/17/2021 0753   VLDL 17.6 06/17/2021 0753   LDLCALC 118 (H) 06/17/2021 0753   LDLDIRECT 137 06/03/2011 0000     Wt Readings from Last 3 Encounters:  09/16/21 191 lb (86.6 kg)  09/15/21 185 lb (83.9 kg)  09/07/21 180 lb (81.6 kg)     Assessment and Plan:   1. CAD without angina: Mild CAD by coronary CTA in August 2021. Continue ASA. Will start Crestor 10 mg every other day. Repeat lipids and LFTs in 12 weeks.   2. Thoracic aortic aneurysm: Non-contrast CT Sept 2022 with stable aneurysm. Repeat CTA chest in march 2023.   3. Hyperlipidemia: See above  Current medicines are reviewed at length with the patient today.  The patient does not have concerns regarding medicines.  The following changes have been made:  no change  Labs/ tests ordered today include:   Orders Placed This Encounter  Procedures   CT ANGIO CHEST AORTA W/CM & OR WO/CM   Hepatic function panel   Lipid panel   Basic Metabolic Panel (BMET)   EKG 12-Lead      Disposition:   F/U with me in one year.    Signed, Lauree Chandler, MD 09/16/2021 4:32 PM    [facility] [place], [place], [place]  [address] Phone: [phone]; Fax: [phone]

## 2021-09-19 ENCOUNTER — Encounter: Payer: Self-pay | Admitting: Internal Medicine

## 2021-09-19 ENCOUNTER — Telehealth: Payer: Self-pay

## 2021-09-19 NOTE — Telephone Encounter (Signed)
  Follow up Call-  Call back number 09/15/2021  Post procedure Call Back phone  # [phone]  Permission to leave phone message Yes  Some recent data might be hidden     Patient questions:  Do you have a fever, pain , or abdominal swelling? No. Pain Score  0 *  Have you tolerated food without any problems?  Yes.  Have you been able to return to your normal activities? Yes.    Do you have any questions about your discharge instructions: Diet   No. Medications  No. Follow up visit  No.  Do you have questions or concerns about your Care? No.  Actions: * If pain score is 4 or above: No action needed, pain <4.  Have you developed a fever since your procedure? no  2.   Have you had an respiratory symptoms (SOB or cough) since your procedure? no  3.   Have you tested positive for COVID 19 since your procedure no  4.   Have you had any family members/close contacts diagnosed with the COVID 19 since your procedure?  no   If yes to any of these questions please route to Joylene John, RN and Joella Prince, RN

## 2021-09-22 ENCOUNTER — Institutional Professional Consult (permissible substitution): Payer: Self-pay | Admitting: Neurology

## 2021-09-25 ENCOUNTER — Encounter: Payer: Self-pay | Admitting: Neurology

## 2021-09-26 ENCOUNTER — Encounter: Payer: Self-pay | Admitting: Neurology

## 2021-09-26 ENCOUNTER — Other Ambulatory Visit: Payer: Self-pay

## 2021-09-26 ENCOUNTER — Ambulatory Visit: Payer: BC Managed Care – PPO | Admitting: Neurology

## 2021-09-26 VITALS — BP 136/73 | HR 40 | Ht 69.0 in | Wt 190.0 lb

## 2021-09-26 DIAGNOSIS — G4733 Obstructive sleep apnea (adult) (pediatric): Secondary | ICD-10-CM

## 2021-09-26 DIAGNOSIS — Z9989 Dependence on other enabling machines and devices: Secondary | ICD-10-CM | POA: Diagnosis not present

## 2021-09-26 NOTE — Patient Instructions (Signed)

## 2021-09-26 NOTE — Progress Notes (Addendum)
SLEEP MEDICINE CLINIC   Provider:  Larey Seat, MD   Primary Care Physician:  Ria Bush, MD   Referring Provider: Dr Jaynee Eagles  Chief Complaint  Patient presents with   New Patient (Initial Visit)    RM 11, alone. Internal referral from Dr. Danise Mina, MD for OSA eval.     HPI:  Derrick Horn is a 60 y.o. male patient, and today he is referred by dr Danise Mina d for a memory evaluation- 09-10-2021. His primary neurologist is Dr Jaynee Eagles . He reports memory loss, amnestic. Confusion. Last MOCA in 2019 / 2020 was only 23/ 30 !   He reports memory loss and has done so before COVID infection in April 2022, since then worsened. Had used prevagen but felt not sure it done anything. Uses CPAP with a nasal cradle.  The patient's primary care physician actually just asked for an annual exam and this was in relation to sleep apnea on continuation of the CPAP machine use.  The patient was referred at the same time to Dr. [name], the [facility].  The encounter date was 24 June 2021 and Dr. Danise Mina also ordered Prevagen Nexium and multivitamins for him.  The patient continues to use his CPAP compliantly at 97% 29 out of 30 days, with an average use at time of 7 hours and 5 minutes.  AutoSet is between 5 cm water pressure and 15 cm water pressure with an EPR of 2 cm.  Residual AHI is 2.5/h, air leaks are very low 7.2 L/min is on the low side, he has some central apneas that arise but the residual apnea index is 2.5 and 2 low to warrant any kind of weak titration.  The 95th percentile pressure need is at 9 cm water pressure well within his current settings.  Cardiology last Friday: wants to evaluate patient further by Echo, Angio?   Epworth today 3/ 24       He had been seen here on 11/28/2018 in a referral  from Dr. Jaynee Eagles for a sleep evaluation. Underwent HST and was diagnosed with sleep apnea, and treated on auto CPAP-  He is neither excessively sleepy, not fatigued, but reports vertigo  and short term memory problems. Had normal MRI with Dr. Jaynee Eagles, but abnormal MOCA. 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 Travels a lot, also through time zones. He is a" low grade smoker of 5-6 cig a day ". He is snoring so loudly that is spouse has left the bedroom.  Had a shoulder surgery hindered him to sleep on his right, he was more restless.He is status post gastric banding.   Chief complaint according to patient :He is neither excessively sleepy, not fatigued, but reports vertigo and short term memory problems. He hopes that sleep disturbances may be the cause and can be treated.   Sleep habits are as follows:  Skips breakfast. Supper time a when at home is around 7 PM, eats horrible- no exercise but may repair or tinker with the car or do yard work. Watches TV 8-10 PM and falls asleep in front of TV if he lays down, not while in seated position. Bedroom is cool, quiet and dark, he enters just before midnight, takes 20-30 minutes to fall asleep.  Mind is busy-  Sleeps on his right with multiple pillows, wakes at 3-4 AM for nocturia. Goes back to sleep for 2 hours, wakes up when wife wakes up at 6 AM.  Wife leaves the house at 7.30 and he may be sleeping until 9 AM. Works from home office, in Press [employer].   Sleep medical history and family sleep history: shoulder surgery 05-04-2018,  Prostrate surgery 2016, for "benign "hyperplasia- PSA stayed down.   Social history: married with one daughter , age 28, one grandchild. Low grade smoker, ETOH : used to drink at  business deals, company events, but not at home.  Caffeine 1 cup of coffee, than an energy bar, 8 ounces coke.     Review of Systems: Out of a complete 14 system review, the patient complains of only the following symptoms, and all other reviewed systems are negative. Reports Urination pain.  Possible ADD/ ADHD.  Very loud speaking- hearing loss denied, but has tinnitus.  Sensitive  stomach memory as in attention/ retention problems.    Epworth score 3/ 24 , Fatigue severity score 14/ 63  ,    Social History   Socioeconomic History   Marital status: Married    Spouse name: Not on file   Number of children: 1   Years of education: Not on file   Highest education level: Not on file  Occupational History   Occupation: Sales  Tobacco Use   Smoking status: Former    Packs/day: 0.25    Years: 30.00    Pack years: 7.50    Types: Cigarettes    Quit date: 11/15/2018    Years since quitting: 2.8   Smokeless tobacco: Never  Vaping Use   Vaping Use: Never used  Substance and Sexual Activity   Alcohol use: Never   Drug use: Never   Sexual activity: Yes  Other Topics Concern   Not on file  Social History Narrative   Caffeine: 1 cup coffee/day   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   1 dog   Activity: shooting, yardwork, no regular exercise   Diet: more salads, more oatmeal, good water   Social Determinants of Health   Financial Resource Strain: Not on file  Food Insecurity: Not on file  Transportation Needs: Not on file  Physical Activity: Not on file  Stress: Not on file  Social Connections: Not on file  Intimate Partner Violence: Not on file    Family History  Problem Relation Age of Onset   Coronary artery disease Father 53       s/p stents, CABG   Prostate cancer Father 101       doing well   Cancer Maternal Grandmother        uterine   Coronary artery disease Maternal Grandfather 35   Stroke Maternal Grandfather    Diabetes Neg Hx    Colon cancer Neg Hx    Colon polyps Neg Hx    Esophageal cancer Neg Hx    Rectal cancer Neg Hx    Stomach cancer Neg Hx     Past Medical History:  Diagnosis Date   Chronic LBP    COPD (chronic obstructive pulmonary disease) ([place])    minimal emphysema on CT scan   GERD (gastroesophageal reflux disease)    mild   Headache    silent migraine  attack age 33's   History of chest pain 2010   stress test WNL   HLD (hyperlipidemia)    mild   Recurrent sinus infections    Sensorineural hearing loss (SNHL) of both ears 12/2015   mild-mod, no need for aides, rec melatoonin and  lipoflavonoid [facility])   Sleep apnea 2020   CPap   Tear of left biceps muscle 01/19/2014    Past Surgical History:  Procedure Laterality Date   BICEPS TENDON REPAIR  2015   [name]   CARPAL TUNNEL RELEASE     right   chest CT  2011   no pulm nodules, mild apical scarring/emphysematous changes   COLONOSCOPY  09/2010   2 hyperplastic polyps [name])   COLONOSCOPY WITH PROPOFOL  09/15/2021   ETT  2010   WNL (Dr. Johnsie Cancel)   [place] TURP (TRANSURETHRAL RESECTION OF PROSTATE N/A 05/30/2016   Procedure: GREEN LIGHT LASER TURP (TRANSURETHRAL RESECTION OF PROSTATE;  Surgeon: Royston Cowper, MD;  Location: [facility] ORS;  Service: Urology;  Laterality: N/A;   SHOULDER ARTHROSCOPY WITH SUBACROMIAL DECOMPRESSION, ROTATOR CUFF REPAIR AND BICEP TENDON REPAIR  04/2018   Dr Onnie Graham    Current Outpatient Medications  Medication Sig Dispense Refill   Apoaequorin (PREVAGEN) 10 MG CAPS Take 1 capsule by mouth daily.     aspirin EC 81 MG tablet Take 1 tablet (81 mg total) by mouth daily.     esomeprazole (NEXIUM) 20 MG capsule Take 2 capsules (40 mg total) by mouth daily at 12 noon.     Multiple Vitamin (MULTIVITAMIN ADULT) TABS Take 1 tablet by mouth daily.     polycarbophil (FIBERCON) 625 MG tablet Take 625 mg by mouth daily.     rosuvastatin (CRESTOR) 10 MG tablet Take 1 tablet (10 mg total) by mouth daily. 90 tablet 3   vitamin B-12 (CYANOCOBALAMIN) 500 MCG tablet Take 1 tablet (500 mcg total) by mouth daily.     No current facility-administered medications for this visit.    Allergies as of 09/26/2021 - Review Complete 09/26/2021  Allergen Reaction Noted   Azithromycin Other (See Comments) 09/22/2010   Pyridium [phenazopyridine hcl]  11/18/2018     Vitals: BP 136/73 (BP Location: Left Arm, Patient Position: Sitting, Cuff Size: Normal)   Pulse (!) 40   Ht 5\' 9"  (1.753 m)   Wt 190 lb (86.2 kg)   SpO2 98%   BMI 28.06 kg/m  Last Weight:  Wt Readings from Last 1 Encounters:  09/26/21 190 lb (86.2 kg)   FBP:ZWCH mass index is 28.06 kg/m.     Last Height:   Ht Readings from Last 1 Encounters:  09/26/21 5\' 9"  (1.753 m)    Physical exam:  General: The patient is awake, alert and appears not in acute distress. The patient is well groomed. Head: Normocephalic, atraumatic. Neck is supple. Mallampati 4 with a thick uvula - low palate. ,  neck circumference: 16" . Nasal airflow congestion, Retrognathia is not seen.  Cardiovascular:  Regular rate and rhythm, without  murmurs or carotid bruit, and without distended neck veins. Respiratory: Lungs are clear to auscultation. Skin:  Without evidence of edema, or rash Trunk: BMI is 28.2 . The patient's posture is erect.   Neurologic exam : The patient is awake and alert, oriented to place and time.   Memory testing revealed :  This patient was scheduled for sleep clinic /OSA follow up and not for a full Memory evaluation.  MOCA: [place] Cognitive Assessment  11/26/2018  Visuospatial/ Executive (0/5) 5  Naming (0/3) 3  Attention: Read list of digits (0/2) 2  Attention: Read list of letters (0/1) 1  Attention: Serial 7 subtraction starting at 100 (0/3) 3  Language: Repeat phrase (0/2) 1  Language : Fluency (0/1) 0  Abstraction (  0/2) 2  Delayed Recall (0/5) 1  Orientation (0/6) 5  Total 23   MMSE:No flowsheet data found.    Attention span & concentration ability appears limited. Very convoluted history, difficulties with keeping reported interval history in a time frame, reference to what plans which specialist has discussed with him.  Speech is fluent,  without dysarthria, dysphonia or aphasia.  Mood and affect are appropriate.  Cranial nerves: Pupils are equal and briskly  reactive to light. Funduscopic exam deferred- Extraocular movements  in vertical and horizontal planes intact and without nystagmus. Visual fields by finger perimetry are intact. Hearing impaired- reports tinnitus.   Facial sensation intact to fine touch.  Facial motor strength is symmetric and tongue and uvula move midline. Shoulder shrug was symmetrical.   Motor exam:   Normal tone, muscle bulk and symmetric strength in all extremities.  Sensory:  Had some trouble with right shoulder, fingers will get numb, but not lasting.  Fine touch, pinprick and vibration were tested in all extremities. Proprioception tested in the upper extremities was normal.  Coordination: Rapid alternating movements in the fingers/hands are normal. No change in penmanship. Finger-to-nose maneuver  normal without evidence of ataxia, dysmetria or tremor. Gait and station: Patient walks without assistive device. He has normal balance.  Deep tendon reflexes: in the upper and lower extremities are symmetric and intact.    Assessment:  After physical and neurologic examination, review of laboratory studies,  Personal review of imaging studies, reports of other /same  Imaging studies, results of polysomnography and / or neurophysiology testing and pre-existing records as far as provided in visit., the patient had not followed up I any way since sleep study in January 2020 ( likely a pandemic fall out). Today, my assessment is   1) OSA on CPAP- CPAP started after HST. Should be followed yearly.   2) longstanding bradycardia, denies symptoms.  3) Patient remarked on confusion, misplacing items, struggling for names-  and amnestic events. Had seen Dr Jaynee Eagles for that. [facility]- will need to have a seperate appointment for that.   I spent more than 25 minutes of face to face time with the patient.  Greater than 50% of time was spent in counseling and coordination of care. We have discussed the diagnosis and differential and I  answered the patient's questions.    Plan:  Treatment plan and additional workup :  We had a very long sleep hygiene discussion, he uses his cell pone in the bedroom, up to 2 AM on week ends, etc.  Urged better routines and keeping them.   Continue Prevagen if you feel it helps.  Continue CPAP, OSA is very well controlled ! Apnea at this level should not be a factor in memory loss/ attention deficit.   Consider referral for neuropsychological- referral for memory versus adult residual ADHD.  There is an office that deal with Attention deficit disorders only here in [place], as well..  The patent will see his primary neurologist or me in 3 months.        Larey Seat, MD 95/28/4132, 4:40 AM  Certified in Neurology by ABPN Certified in [place] by [facility] [address], [place], [place] [address]

## 2021-09-28 ENCOUNTER — Ambulatory Visit: Payer: BC Managed Care – PPO

## 2021-09-29 ENCOUNTER — Other Ambulatory Visit: Payer: Self-pay | Admitting: Acute Care

## 2021-09-29 ENCOUNTER — Encounter: Payer: Self-pay | Admitting: *Deleted

## 2021-09-29 DIAGNOSIS — Z87891 Personal history of nicotine dependence: Secondary | ICD-10-CM

## 2021-09-29 NOTE — Progress Notes (Signed)
Please call patient and let them  know their  low dose Ct was read as a Lung RADS 2: nodules that are benign in appearance and behavior with a very low likelihood of becoming a clinically active cancer due to size or lack of growth. Recommendation per radiology is for a repeat LDCT in 12 months. .Please let them  know we will order and schedule their  annual screening scan for 08/2022. Please let them  know there was notation of CAD on their  scan.  Please remind the patient  that this is a non-gated exam therefore degree or severity of disease  cannot be determined. Please have them  follow up with their PCP regarding potential risk factor modification, dietary therapy or pharmacologic therapy if clinically indicated. Pt.  is  currently on statin therapy. Please place order for annual  screening scan for  08/2022 and fax results to PCP. Thanks so much.  Please let patient know there was notation of a stable ascending aortic aneurysm ( 4.5 cm) and an ascending thoracic aneurysm. Recommendation is for semi-annual imaging. Please have his PCP refer to Thoracic Surgery. Annual lung caner screening can serve as one of the two screenings per year.  Thanks so much

## 2021-10-06 ENCOUNTER — Encounter: Payer: Self-pay | Admitting: Family Medicine

## 2021-10-06 DIAGNOSIS — J449 Chronic obstructive pulmonary disease, unspecified: Secondary | ICD-10-CM | POA: Insufficient documentation

## 2021-10-13 ENCOUNTER — Ambulatory Visit: Payer: BC Managed Care – PPO

## 2021-10-20 ENCOUNTER — Ambulatory Visit (INDEPENDENT_AMBULATORY_CARE_PROVIDER_SITE_OTHER): Payer: BC Managed Care – PPO

## 2021-10-20 ENCOUNTER — Other Ambulatory Visit: Payer: Self-pay

## 2021-10-20 DIAGNOSIS — Z23 Encounter for immunization: Secondary | ICD-10-CM

## 2021-10-20 NOTE — Progress Notes (Signed)
Per orders of Dr. Einar Pheasant in absence of Dr. Danise Mina, an injection of Zoster was given by Ophelia Shoulder. Patient tolerated injection well.

## 2022-02-02 ENCOUNTER — Ambulatory Visit: Payer: BC Managed Care – PPO | Admitting: Neurology

## 2022-02-02 ENCOUNTER — Encounter: Payer: Self-pay | Admitting: Neurology

## 2022-02-02 ENCOUNTER — Telehealth: Payer: Self-pay | Admitting: Neurology

## 2022-02-02 VITALS — BP 120/72 | HR 43 | Ht 70.0 in | Wt 196.8 lb

## 2022-02-02 DIAGNOSIS — E519 Thiamine deficiency, unspecified: Secondary | ICD-10-CM

## 2022-02-02 DIAGNOSIS — R413 Other amnesia: Secondary | ICD-10-CM

## 2022-02-02 DIAGNOSIS — R9082 White matter disease, unspecified: Secondary | ICD-10-CM

## 2022-02-02 DIAGNOSIS — E559 Vitamin D deficiency, unspecified: Secondary | ICD-10-CM | POA: Diagnosis not present

## 2022-02-02 DIAGNOSIS — E538 Deficiency of other specified B group vitamins: Secondary | ICD-10-CM

## 2022-02-02 NOTE — Patient Instructions (Signed)
Formal memory testing MRI brain Blood work Financial trader at [employer]  Use "WARM" strategy.  W= write it down  A= associate it  R= repeat it  M= make a mental note  2.   You can keep a Social worker.  Use a 3-ring notebook with sections for the following: calendar, important names and phone numbers,  medications, doctors' names/phone numbers, lists/reminders, and a section to journal what you did  each day.   3.    Use a calendar to write appointments down.  4.    Write yourself a schedule for the day.  This can be placed on the calendar or in a separate section of the Memory Notebook.  Keeping a  regular schedule can help memory.  5.    Use medication organizer with sections for each day or morning/evening pills.  You may need help loading it  6.    Keep a basket, or pegboard by the door.  Place items that you need to take out with you in the basket or on the pegboard.  You may also want to  include a message board for reminders.  7.    Use sticky notes.  Place sticky notes with reminders in a place where the task is performed.  For example: " turn off the  stove" placed by the stove, "lock the door" placed on the door at eye level, " take your medications" on  the bathroom mirror or by the place where you normally take your medications.  8.    Use alarms/timers.  Use while cooking to remind yourself to check on food or as a reminder to take your medicine, or as a  reminder to make a call, or as a reminder to perform another task, etc.

## 2022-02-02 NOTE — Telephone Encounter (Signed)
Referral sent to Dr. Alphonzo Severance [phone].

## 2022-02-02 NOTE — Progress Notes (Addendum)
[name]    Provider:  Dr Jaynee Eagles Referring Provider: Ria Bush, MD Primary Care Physician:  Ria Bush, MD  CC:  Memory loss  Interval history: Patient saw me in 11/2019 for memory problems. I sent him to sleep studies and, if it was positive, asked him to use cpap for 6 months and return to re-evaluate memory. He was diagnosed in 12/2019 with Moderate- Severe sleep apnea at AHI 27.5/h, OSA without REM accentuation and without hypoxemia. Moderate snoring. Sinus Bradycardia. He has been compliant but patient never followed back up with me for a memory recheck. MRI of the brain in 11/2018 was unremarkable. He was diagnosed with B12 deficiency(196 and 226 in the past) but most recent 06/2021 was normal. TSH in 06/2020 was normal.   Still having memory problems. More short term memory. Wife provides information. He can remember something that happened in the third grade but not a name recently. He performs all his ADLs and IADLs, he is also under a lot of stress. His wife reports he goes to tell her something and he can't think of a word and he says that "thing", he repeats things and goes off on a tangent and can't find his words. It is aggravating. He feels stupid. He feels like it is progressive. After covid he thinks he became worse. Needs to keep lists or forgets things. Forgets customers names. He can compensate but still for years feels his short term [name] is impaired. He smoked a lot weed from [age].   [facility] Brain Study   MRI 11/2018: IMPRESSION: 1. Mild chronic small vessel disease without acute abnormality. 2. No age advanced or lobar specific atrophy pattern. Quantitative, volumetric MRI of the brain may be helpful for more specific evaluation for characteristic atrophy patterns associated with dementia.  Patient complains of symptoms per HPI as well as the following symptoms: short term memory loss, stress . Pertinent negatives and positives  per HPI. All others negative   HPI:  Derrick Horn is a 61 y.o. male here as requested by Dr. Danise Mina for cerebrovascular small vessel disease. PMHx LBP, decreased hearing, HLD, ex-smoker, oberweight, memory loss. He reports short-term memory changes in the last 3 weeks he was snoring so badly he went to another room. He had vertigo. Was diagnosed with BPPV.  He feels like he can't remember anything short term. He can't remember hs name. 20 years ago he had a "silent migraine" attack, his right side was buzzing, he was slurring. His wife is here and provides much information. 3 weeks ago he woke up, he got very dizzy, didn't feel well, the room was spinning. It has resolved. He feels his peripheral vision will get fuzzy and wavy. The snoring is severe, wife has to leave the room, she sleeps in the other room, he sleeps "horrible", he dozes off a lot.  He wakes up tired and dry mouth. No other focal neurologic deficits, associated symptoms, inciting events or modifiable factors.  Reviewed notes, labs and imaging from outside physicians, which showed:  Personally reviewed images and agree with the following:  1. Mild chronic small vessel disease without acute abnormality. 2. No age advanced or lobar specific atrophy pattern. Quantitative, volumetric MRI of the brain may be helpful for more specific evaluation for characteristic atrophy patterns associated with dementia.  Review Dr. Bosie Clos notes patient was last seen earlier this month for memory loss complaining of short-term memory issues for about 1 month no problems with long-term memories and  ringing in the ears.  He was also diagnosed with benign positional vertigo which lasted 2-1/2 days.  Noticing multiple concerns for memory forgets when he turns on water, forgot address this morning, drove back from airport and arrived the wrong house, short-term memory loss not functioning right.  No alcohol.  He recently quit smoking 3 days  ago.  Review of Systems: Patient complains of symptoms per HPI as well as the following symptoms: memory loss. Pertinent negatives and positives per HPI. All others negative.   Social History   Socioeconomic History   Marital status: Married    Spouse name: Not on file   Number of children: 1   Years of education: Not on file   Highest education level: Not on file  Occupational History   Occupation: Sales  Tobacco Use   Smoking status: Former    Packs/day: 0.25    Years: 30.00    Pack years: 7.50    Types: Cigarettes    Quit date: 11/15/2018    Years since quitting: 3.2   Smokeless tobacco: Never  Vaping Use   Vaping Use: Never used  Substance and Sexual Activity   Alcohol use: Never   Drug use: Never   Sexual activity: Yes  Other Topics Concern   Not on file  Social History Narrative   Caffeine: 1 cup coffee/day   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   1 dog   Activity: shooting, yardwork, no regular exercise   Diet: more salads, more oatmeal, good water   Social Determinants of Health   Financial Resource Strain: Not on file  Food Insecurity: Not on file  Transportation Needs: Not on file  Physical Activity: Not on file  Stress: Not on file  Social Connections: Not on file  Intimate Partner Violence: Not on file    Family History  Problem Relation Age of Onset   Coronary artery disease Father 39       s/p stents, CABG   Prostate cancer Father 96       doing well   Cancer Maternal Grandmother        uterine   Coronary artery disease Maternal Grandfather 35   Stroke Maternal Grandfather    Diabetes Neg Hx    Colon cancer Neg Hx    Colon polyps Neg Hx    Esophageal cancer Neg Hx    Rectal cancer Neg Hx    Stomach cancer Neg Hx    Memory loss Neg Hx    Dementia Neg Hx     Past Medical History:  Diagnosis Date   Chronic LBP    COPD (chronic obstructive pulmonary disease) ([place])     minimal emphysema on CT scan   GERD (gastroesophageal reflux disease)    mild   Headache    silent migraine attack age 36's   History of chest pain 2010   stress test WNL   HLD (hyperlipidemia)    mild   Recurrent sinus infections    Sensorineural hearing loss (SNHL) of both ears 12/2015   mild-mod, no need for aides, rec melatoonin and lipoflavonoid [facility])   Sleep apnea 2020   CPap   Tear of left biceps muscle 01/19/2014    Past Surgical History:  Procedure Laterality Date   BICEPS TENDON REPAIR  2015   [name]   CARPAL TUNNEL RELEASE     right   chest CT  2011   no  pulm nodules, mild apical scarring/emphysematous changes   COLONOSCOPY  09/2010   2 hyperplastic polyps [name])   COLONOSCOPY WITH PROPOFOL  09/15/2021   TAs, SSP, diverticulosis, hemorrhoids, rpt 3 yrs [name])   ETT  2010   WNL (Dr. Johnsie Cancel)   [place] TURP (TRANSURETHRAL RESECTION OF PROSTATE N/A 05/30/2016   Procedure: GREEN LIGHT LASER TURP (TRANSURETHRAL RESECTION OF PROSTATE;  Surgeon: Royston Cowper, MD;  Location: [facility] ORS;  Service: Urology;  Laterality: N/A;   SHOULDER ARTHROSCOPY WITH SUBACROMIAL DECOMPRESSION, ROTATOR CUFF REPAIR AND BICEP TENDON REPAIR  04/2018   Dr Onnie Graham    Current Outpatient Medications  Medication Sig Dispense Refill   aspirin EC 81 MG tablet Take 1 tablet (81 mg total) by mouth daily.     Coenzyme Q10 (CO Q 10 PO) Take by mouth 3 (three) times a week.     esomeprazole (NEXIUM) 20 MG capsule Take 2 capsules (40 mg total) by mouth daily at 12 noon.     Multiple Vitamin (MULTIVITAMIN ADULT) TABS Take 1 tablet by mouth daily.     polycarbophil (FIBERCON) 625 MG tablet Take 625 mg by mouth daily.     vitamin B-12 (CYANOCOBALAMIN) 500 MCG tablet Take 1 tablet (500 mcg total) by mouth daily.     Apoaequorin (PREVAGEN) 10 MG CAPS Take 1 capsule by mouth daily.     rosuvastatin (CRESTOR) 10 MG tablet Take 1 tablet (10 mg total) by mouth daily. (Patient taking  differently: Take 10 mg by mouth 3 (three) times a week.) 90 tablet 3   No current facility-administered medications for this visit.    Allergies as of 02/02/2022 - Review Complete 02/02/2022  Allergen Reaction Noted   Azithromycin Other (See Comments) 09/22/2010   Pyridium [phenazopyridine hcl]  11/18/2018    Vitals: BP 120/72    Pulse (!) 43    Ht 5\' 10"  (1.778 m)    Wt 196 lb 12.8 oz (89.3 kg)    BMI 28.24 kg/m  Last Weight:  Wt Readings from Last 1 Encounters:  02/02/22 196 lb 12.8 oz (89.3 kg)   Last Height:   Ht Readings from Last 1 Encounters:  02/02/22 5\' 10"  (1.778 m)   Physical exam: Exam: Gen: NAD, conversant, well nourised, obese, well groomed                     CV: RRR, no MRG. No Carotid Bruits. No peripheral edema, warm, nontender Eyes: Conjunctivae clear without exudates or hemorrhage  Neuro: Detailed Neurologic Exam  Speech:    Speech is normal; fluent and spontaneous with normal comprehension.  Cognition:  Montreal Cognitive Assessment  02/02/2022 11/26/2018  Visuospatial/ Executive (0/5) 5 5  Naming (0/3) 3 3  Attention: Read list of digits (0/2) 2 2  Attention: Read list of letters (0/1) 1 1  Attention: Serial 7 subtraction starting at 100 (0/3) 1 3  Language: Repeat phrase (0/2) 2 1  Language : Fluency (0/1) 1 0  Abstraction (0/2) 2 2  Delayed Recall (0/5) 5 1  Orientation (0/6) 6 5  Total 28 23    Cranial Nerves:    The pupils are equal, round, and reactive to light. Pupils too small to visualize fundi. . Visual fields are full to finger confrontation. Extraocular movements are intact. Trigeminal sensation is intact and the muscles of mastication are normal. The face is symmetric. The palate elevates in the midline. Hearing intact. Voice is normal. Shoulder shrug is normal. The tongue has  normal motion without fasciculations.   Coordination:    Normal finger to nose and heel to shin. Normal rapid alternating movements.   Gait:    Heel-toe  and tandem gait are normal.   Motor Observation:    No asymmetry, no atrophy, and no involuntary movements noted. Tone:    Normal muscle tone.    Posture:    Posture is normal. normal erect    Strength:    Strength is V/V in the upper and lower limbs.      Sensation: intact to LT     Reflex Exam:  DTR's:    Deep tendon reflexes in the upper and lower extremities are brisk but symmetrical bilaterally.   Toes:    The toes are equivocal bilaterally.   Clonus:    Clonus is absent.       Assessment/Plan:  61 y.o. male here as requested by Dr. Danise Mina for cerebrovascular small vessel disease and memory loss.  PMHx LBP, decreased hearing, HLD, ex-smoker, oberweight, memory loss. Patient saw me in 11/2019 for memory problems. I sent him to sleep studies and he was diagnosed in 12/2019 with Moderate- Severe sleep apnea at AHI 27.5/h, OSA without REM accentuation and without hypoxemia. Moderate snoring. Sinus Bradycardia. He has been compliant but patient never followed back up with me for a memory recheck after treatment. MRI of the brain in 11/2018 was unremarkable. He was diagnosed with B12 deficiency(196 and 226 in the past) but most recent 06/2021 was normal. TSH in 06/2020 was normal. Today he is back for continued memory loss however his MoCA improved from 23 to 30.   - MRI of the brain unremarkable, mild chronic small vessel disease but no signs of dementia-like changes on MRI brain. He is a -smoker. Discussed managing vascular risk factors. This was in 2019, will repeat for reversible causes of memory loss/dementia.  -  I think atthis point let's just do the formal neurocognitive testing. Still has tinnitus and some sensorineural hearing loss. No FHx of dementia.   - B12 deficiency he is now on B12 supplement. B12 196. Most recent 06/2021 was normal  - will order a few more labs. Send to formal neurocognitive testing. If he has early onset dementia, Aricept contraindicated due to  bradycardia can try namenda. I think prevagen is a scam, but up to him to continue.   - MoCA improved from 23 to 30 we discussed this is not a neurodegenerative disorder which would not get better, we reviewed results.  Orders Placed This Encounter  Procedures   MR BRAIN W WO CONTRAST   Vitamin B1   Homocysteine   Vitamin D, 25-hydroxy   CBC with Differential/Platelets   Comprehensive metabolic panel   Lipid Panel   Hemoglobin A1c   Ambulatory referral to Neuropsychology     Sarina Ill, MD  [facility] [address] [place], [place] [address]  Phone [phone] Fax [phone]

## 2022-02-05 LAB — COMPREHENSIVE METABOLIC PANEL
ALT: 25 IU/L (ref 0–44)
AST: 17 IU/L (ref 0–40)
Albumin/Globulin Ratio: 2.1 (ref 1.2–2.2)
Albumin: 4.9 g/dL — ABNORMAL HIGH (ref 3.8–4.8)
Alkaline Phosphatase: 87 IU/L (ref 44–121)
BUN/Creatinine Ratio: 17 (ref 10–24)
BUN: 16 mg/dL (ref 8–27)
Bilirubin Total: 0.6 mg/dL (ref 0.0–1.2)
CO2: 25 mmol/L (ref 20–29)
Calcium: 9.2 mg/dL (ref 8.6–10.2)
Chloride: 106 mmol/L (ref 96–106)
Creatinine, Ser: 0.95 mg/dL (ref 0.76–1.27)
Globulin, Total: 2.3 g/dL (ref 1.5–4.5)
Glucose: 108 mg/dL — ABNORMAL HIGH (ref 70–99)
Potassium: 4 mmol/L (ref 3.5–5.2)
Sodium: 142 mmol/L (ref 134–144)
Total Protein: 7.2 g/dL (ref 6.0–8.5)
eGFR: 91 mL/min/{1.73_m2} (ref >59)

## 2022-02-05 LAB — CBC WITH DIFFERENTIAL/PLATELET
Basophils Absolute: 0 10*3/uL (ref 0.0–0.2)
Basos: 1 %
EOS (ABSOLUTE): 0.3 10*3/uL (ref 0.0–0.4)
Eos: 6 %
Hematocrit: 49.7 % (ref 37.5–51.0)
Hemoglobin: 16.5 g/dL (ref 13.0–17.7)
Immature Grans (Abs): 0 10*3/uL (ref 0.0–0.1)
Immature Granulocytes: 0 %
Lymphocytes Absolute: 1.6 10*3/uL (ref 0.7–3.1)
Lymphs: 33 %
MCH: 30.2 pg (ref 26.6–33.0)
MCHC: 33.2 g/dL (ref 31.5–35.7)
MCV: 91 fL (ref 79–97)
Monocytes Absolute: 0.4 10*3/uL (ref 0.1–0.9)
Monocytes: 9 %
Neutrophils Absolute: 2.6 10*3/uL (ref 1.4–7.0)
Neutrophils: 51 %
Platelets: 176 10*3/uL (ref 150–450)
RBC: 5.47 x10E6/uL (ref 4.14–5.80)
RDW: 12.7 % (ref 11.6–15.4)
WBC: 4.9 10*3/uL (ref 3.4–10.8)

## 2022-02-05 LAB — LIPID PANEL
Chol/HDL Ratio: 2.8 ratio (ref 0.0–5.0)
Cholesterol, Total: 141 mg/dL (ref 100–199)
HDL: 51 mg/dL (ref >39)
LDL Chol Calc (NIH): 73 mg/dL (ref 0–99)
Triglycerides: 90 mg/dL (ref 0–149)
VLDL Cholesterol Cal: 17 mg/dL (ref 5–40)

## 2022-02-05 LAB — VITAMIN B1: Thiamine: 147.4 nmol/L (ref 66.5–200.0)

## 2022-02-05 LAB — HEMOGLOBIN A1C
Est. average glucose Bld gHb Est-mCnc: 117 mg/dL
Hgb A1c MFr Bld: 5.7 % — ABNORMAL HIGH (ref 4.8–5.6)

## 2022-02-05 LAB — VITAMIN D 25 HYDROXY (VIT D DEFICIENCY, FRACTURES): Vit D, 25-Hydroxy: 27.6 ng/mL — ABNORMAL LOW (ref 30.0–100.0)

## 2022-02-05 LAB — HOMOCYSTEINE: Homocysteine: 11.5 umol/L (ref 0.0–17.2)

## 2022-02-08 ENCOUNTER — Telehealth: Payer: Self-pay | Admitting: Neurology

## 2022-02-08 NOTE — Telephone Encounter (Signed)
MR Brain w/wo contrast Dr. Ihor Dow Josem Kaufmann: [id number] (exp. 02/07/22 to 03/08/22). Patient is scheduled at [facility] for 02/14/22.  ?

## 2022-02-13 ENCOUNTER — Encounter: Payer: Self-pay | Admitting: Family Medicine

## 2022-02-14 ENCOUNTER — Other Ambulatory Visit: Payer: Self-pay

## 2022-02-14 ENCOUNTER — Ambulatory Visit: Payer: BC Managed Care – PPO

## 2022-02-14 DIAGNOSIS — R413 Other amnesia: Secondary | ICD-10-CM | POA: Diagnosis not present

## 2022-02-14 DIAGNOSIS — E538 Deficiency of other specified B group vitamins: Secondary | ICD-10-CM

## 2022-02-14 DIAGNOSIS — R9082 White matter disease, unspecified: Secondary | ICD-10-CM

## 2022-02-14 MED ORDER — GADOBENATE DIMEGLUMINE 529 MG/ML IV SOLN
20.0000 mL | Freq: Once | INTRAVENOUS | Status: AC | PRN
  Administered 2022-02-14: 20 mL via INTRAVENOUS

## 2022-02-24 ENCOUNTER — Other Ambulatory Visit: Payer: Self-pay

## 2022-02-24 ENCOUNTER — Ambulatory Visit (INDEPENDENT_AMBULATORY_CARE_PROVIDER_SITE_OTHER)
Admission: RE | Admit: 2022-02-24 | Discharge: 2022-02-24 | Disposition: A | Discharge status: Home / Self Care | Payer: BC Managed Care – PPO | Source: Ambulatory Visit | Attending: Cardiovascular Disease | Admitting: Cardiovascular Disease

## 2022-02-24 ENCOUNTER — Telehealth: Payer: Self-pay

## 2022-02-24 DIAGNOSIS — I2510 Atherosclerotic heart disease of native coronary artery without angina pectoris: Secondary | ICD-10-CM | POA: Diagnosis not present

## 2022-02-24 DIAGNOSIS — I77810 Thoracic aortic ectasia: Secondary | ICD-10-CM | POA: Diagnosis not present

## 2022-02-24 DIAGNOSIS — I7121 Aneurysm of the ascending aorta, without rupture: Secondary | ICD-10-CM

## 2022-02-24 MED ORDER — IOHEXOL 350 MG/ML SOLN
100.0000 mL | Freq: Once | INTRAVENOUS | Status: AC | PRN
Start: 2022-02-24 — End: 2022-02-24
  Administered 2022-02-24: 100 mL via INTRAVENOUS

## 2022-02-24 NOTE — Telephone Encounter (Signed)
The patient has been notified of the result and verbalized understanding.  All questions (if any) were answered. ?Antonieta Iba, RN 02/24/2022 2:36 PM  ? ?

## 2022-02-24 NOTE — Telephone Encounter (Signed)
-----   Message from Burnell Blanks, MD sent at 02/24/2022  2:07 PM EDT ----- ?Stable mild dilation of the ascending aorta. Repeat CTA one year. cdm ?

## 2022-03-10 DIAGNOSIS — M545 Low back pain, unspecified: Secondary | ICD-10-CM | POA: Diagnosis not present

## 2022-03-29 DIAGNOSIS — R051 Acute cough: Secondary | ICD-10-CM | POA: Diagnosis not present

## 2022-04-19 IMAGING — RF DG ESOPHAGUS
9 series · 19 of 24 positions shown · non-contrast
Comparison: Chest radiograph 06/19/2020

CLINICAL DATA: Dysphagia.  Chest pain.

EXAM:
ESOPHOGRAM / BARIUM SWALLOW / BARIUM TABLET STUDY
TECHNIQUE: Combined double contrast and single contrast examination performed
using effervescent crystals, thick barium liquid, and thin barium
liquid. The patient was observed with fluoroscopy swallowing a 13 mm
barium sulphate tablet.
FLUOROSCOPY TIME:  Fluoroscopy Time:  2 minutes and 42 seconds
Radiation Exposure Index (if provided by the fluoroscopic device):
39.9 mGy
Number of Acquired Spot Images: 0

[Series 1: cp_standard · 0.37mm/px · 2 of 50 frames shown (1 of 9)]
[frame 8/50]
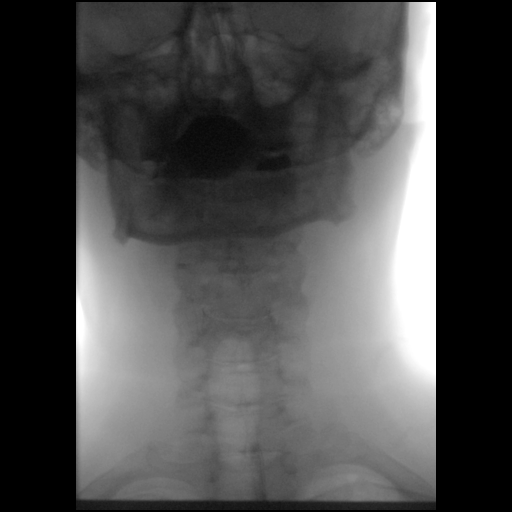
[frame 26/50]
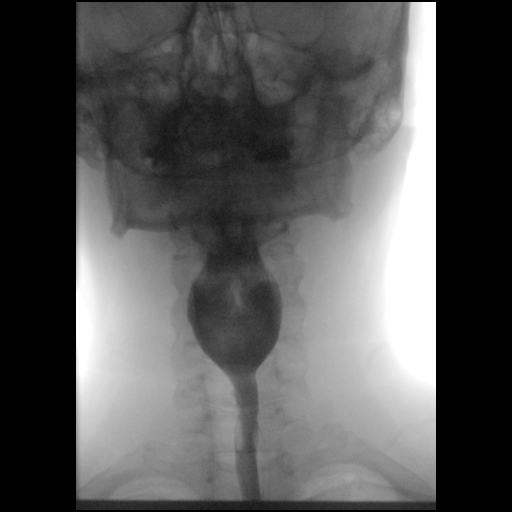

[Series 2: cp_standard · 0.37mm/px · 3 of 51 frames shown (2 of 9)]
[frame 8/51]
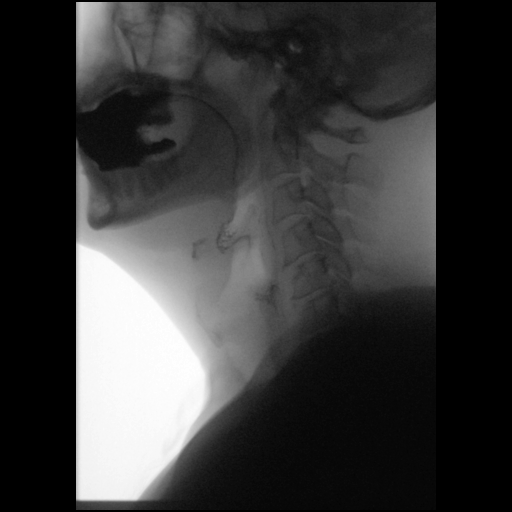
[frame 42/51]
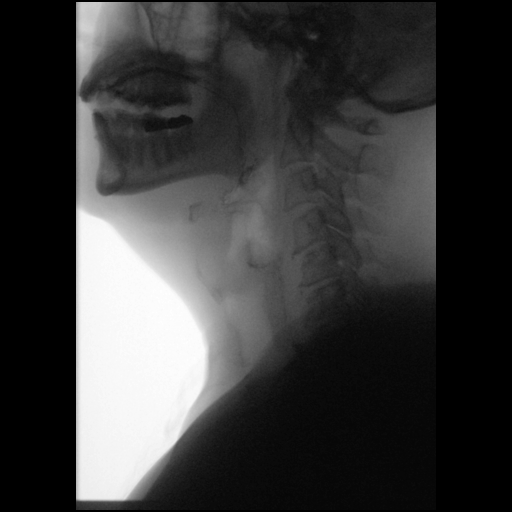
[frame 44/51]
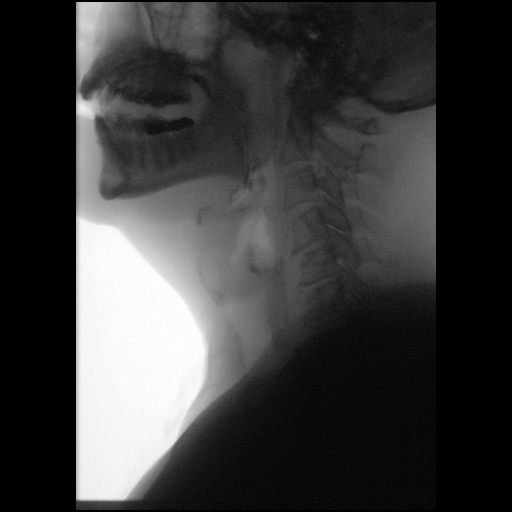

[Series 3: cp_standard · 0.55mm/px · 2 of 201 frames shown (3 of 9)]
[frame 31/201]
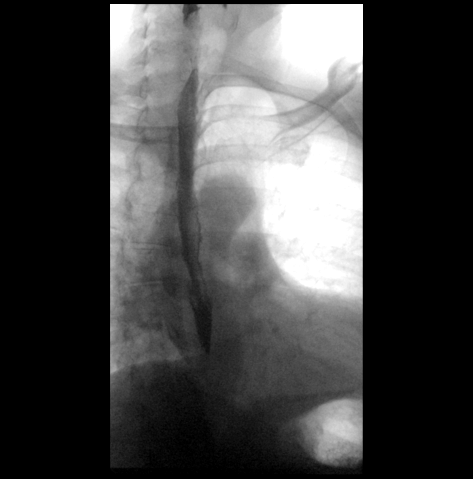
[frame 171/201]
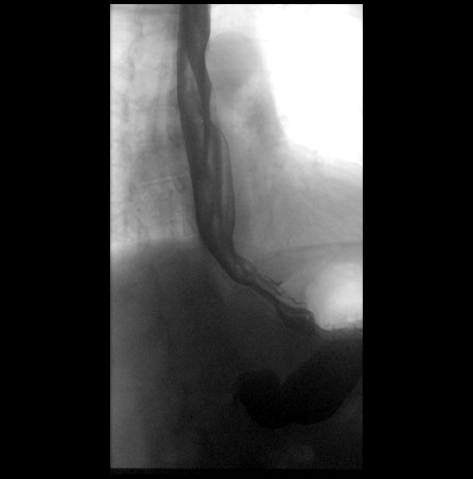

[Series 4: cp_standard · 0.55mm/px · 2 of 144 frames shown (4 of 9)]
[frame 73/144]
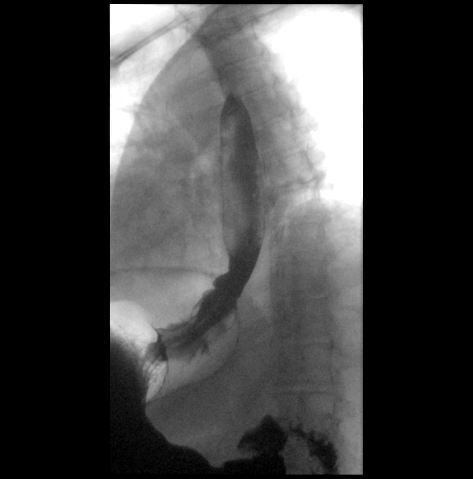
[frame 123/144]
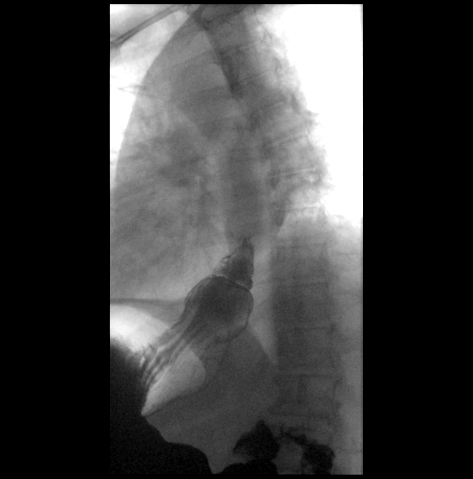

[Series 5: cp_standard · 0.55mm/px · 3 of 124 frames shown (5 of 9)]
[frame 63/124]
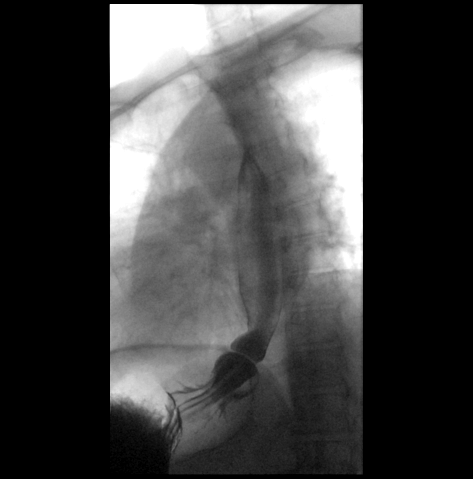
[frame 106/124]
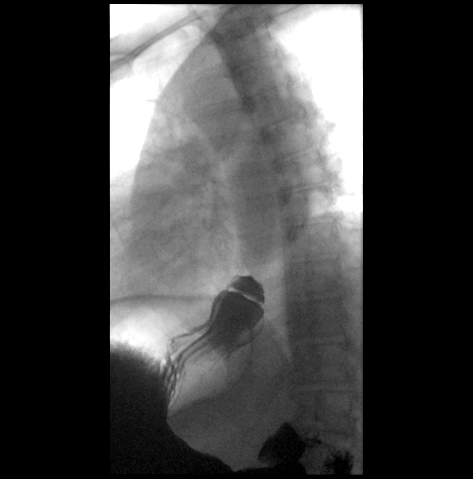
[frame 124/124]
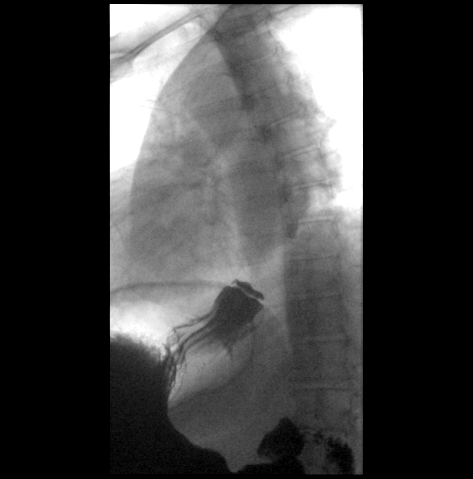

[Series 6: cp_standard · 0.55mm/px · 1 of 389 frames shown (6 of 9)]
[frame 195/389]
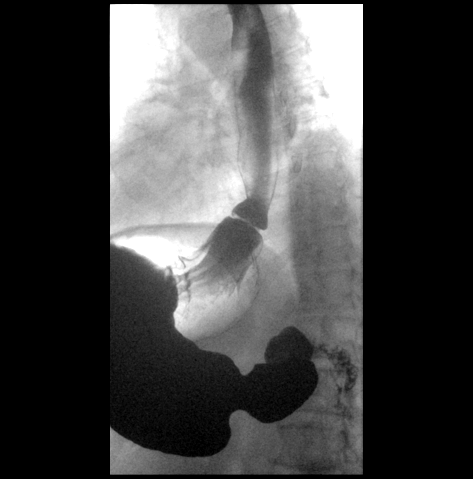

[Series 7: cp_standard · 0.52mm/px · 3 of 190 frames shown (7 of 9)]
[frame 29/190]
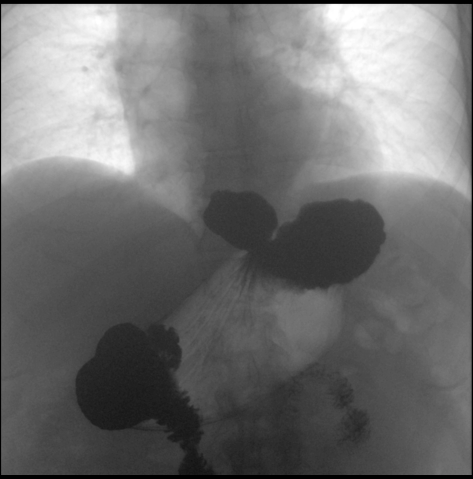
[frame 96/190]
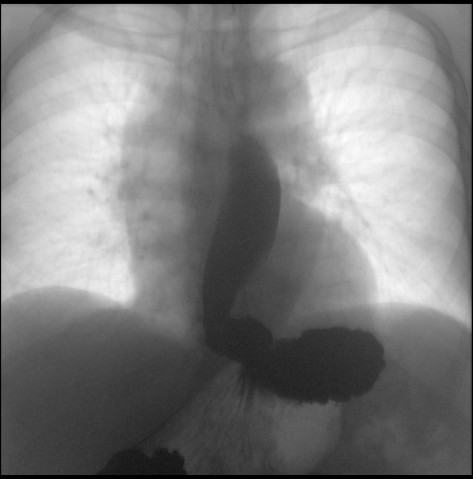
[frame 162/190]
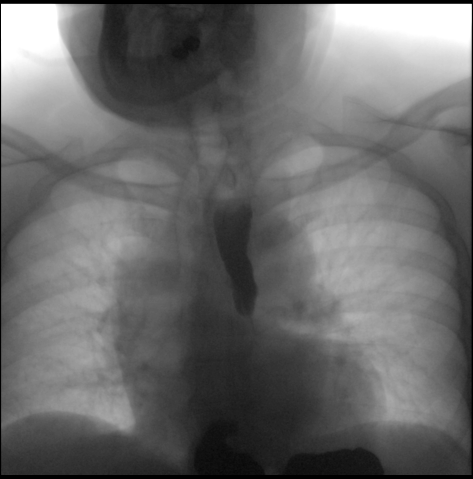

[Series 8: cp_standard · 0.52mm/px · 2 of 91 frames shown (8 of 9)]
[frame 14/91]
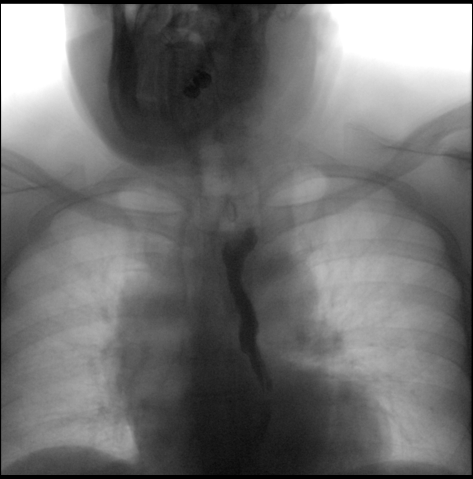
[frame 78/91]
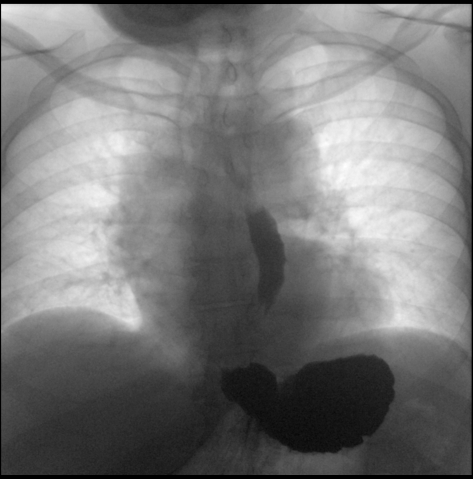

[Series 9: cp_standard · 0.31mm/px · 1 of 1 slices shown (9 of 9)]
[im 1/1]
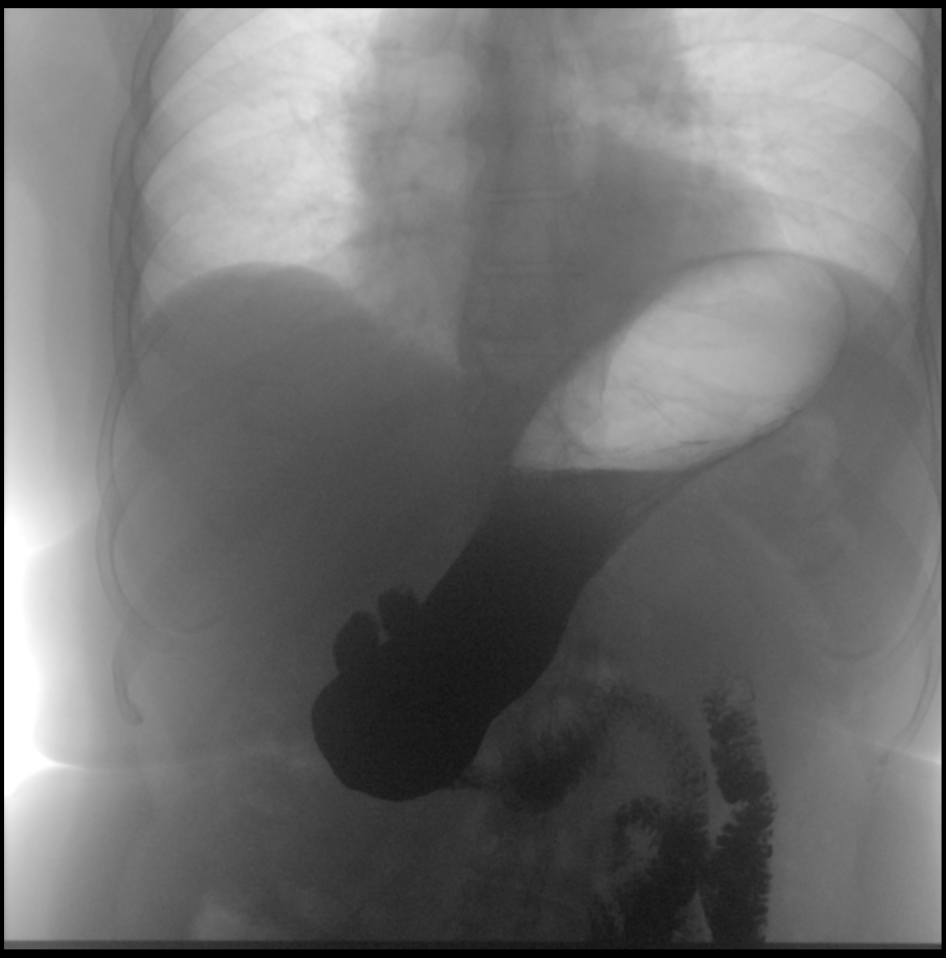

[19 of 24 positions shown; findings below may reference images not displayed]

FINDINGS: Hypopharyngeal portion of the exam is unremarkable. Prominent
cricopharyngeus muscle is incidentally noted.

Double contrast evaluation of the esophagus demonstrates no mucosal
abnormality.

Evaluation of primary peristalsis demonstrates a normal primary
peristaltic wave on each swallow.

Full column evaluation of the esophagus demonstrates no persistent
narrowing or stricture.

A small hiatal hernia. A mucosal ring is identified, including on
299/6.

gastroesophageal reflux identified with water swallows the level of
the upper chest. Example 173/7.

A 13 mm barium tablet passes promptly.
IMPRESSION: 1. Small hiatal hernia with mucosal ring. Lack of delay of contrast
passage at this site argues against obstructive narrowing.
2. Gastroesophageal reflux with water swallows.

## 2022-09-05 ENCOUNTER — Other Ambulatory Visit: Payer: Self-pay | Admitting: Family Medicine

## 2022-09-05 ENCOUNTER — Other Ambulatory Visit (INDEPENDENT_AMBULATORY_CARE_PROVIDER_SITE_OTHER): Payer: BC Managed Care – PPO

## 2022-09-05 DIAGNOSIS — E785 Hyperlipidemia, unspecified: Secondary | ICD-10-CM | POA: Diagnosis not present

## 2022-09-05 DIAGNOSIS — E538 Deficiency of other specified B group vitamins: Secondary | ICD-10-CM

## 2022-09-05 DIAGNOSIS — N400 Enlarged prostate without lower urinary tract symptoms: Secondary | ICD-10-CM

## 2022-09-05 DIAGNOSIS — E559 Vitamin D deficiency, unspecified: Secondary | ICD-10-CM

## 2022-09-05 DIAGNOSIS — R7303 Prediabetes: Secondary | ICD-10-CM

## 2022-09-05 LAB — COMPREHENSIVE METABOLIC PANEL
ALT: 17 U/L (ref 0–53)
AST: 14 U/L (ref 0–37)
Albumin: 4.1 g/dL (ref 3.5–5.2)
Alkaline Phosphatase: 71 U/L (ref 39–117)
BUN: 14 mg/dL (ref 6–23)
CO2: 29 mEq/L (ref 19–32)
Calcium: 9.2 mg/dL (ref 8.4–10.5)
Chloride: 105 mEq/L (ref 96–112)
Creatinine, Ser: 0.99 mg/dL (ref 0.40–1.50)
GFR: 81.79 mL/min (ref >60.00)
Glucose, Bld: 120 mg/dL — ABNORMAL HIGH (ref 70–99)
Potassium: 4.2 mEq/L (ref 3.5–5.1)
Sodium: 139 mEq/L (ref 135–145)
Total Bilirubin: 0.8 mg/dL (ref 0.2–1.2)
Total Protein: 6.7 g/dL (ref 6.0–8.3)

## 2022-09-05 LAB — HEMOGLOBIN A1C: Hgb A1c MFr Bld: 5.8 % (ref 4.6–6.5)

## 2022-09-05 LAB — LIPID PANEL
Cholesterol: 123 mg/dL (ref 0–200)
HDL: 48.8 mg/dL (ref >39.00)
LDL Cholesterol: 58 mg/dL (ref 0–99)
NonHDL: 74.45
Total CHOL/HDL Ratio: 3
Triglycerides: 83 mg/dL (ref 0.0–149.0)
VLDL: 16.6 mg/dL (ref 0.0–40.0)

## 2022-09-05 LAB — VITAMIN B12: Vitamin B-12: 790 pg/mL (ref 211–911)

## 2022-09-05 LAB — PSA: PSA: 1.31 ng/mL (ref 0.10–4.00)

## 2022-09-05 LAB — VITAMIN D 25 HYDROXY (VIT D DEFICIENCY, FRACTURES): VITD: 38.32 ng/mL (ref 30.00–100.00)

## 2022-09-07 ENCOUNTER — Ambulatory Visit
Admission: RE | Admit: 2022-09-07 | Discharge: 2022-09-07 | Disposition: A | Discharge status: Home / Self Care | Payer: BC Managed Care – PPO | Source: Ambulatory Visit | Attending: Family Medicine | Admitting: Family Medicine

## 2022-09-07 DIAGNOSIS — Z87891 Personal history of nicotine dependence: Secondary | ICD-10-CM | POA: Diagnosis not present

## 2022-09-11 ENCOUNTER — Other Ambulatory Visit: Payer: Self-pay | Admitting: Acute Care

## 2022-09-11 DIAGNOSIS — Z87891 Personal history of nicotine dependence: Secondary | ICD-10-CM

## 2022-09-11 DIAGNOSIS — Z122 Encounter for screening for malignant neoplasm of respiratory organs: Secondary | ICD-10-CM

## 2022-09-12 ENCOUNTER — Ambulatory Visit (INDEPENDENT_AMBULATORY_CARE_PROVIDER_SITE_OTHER): Payer: BC Managed Care – PPO | Admitting: Family Medicine

## 2022-09-12 ENCOUNTER — Encounter: Payer: Self-pay | Admitting: Family Medicine

## 2022-09-12 VITALS — BP 122/82 | HR 43 | Temp 97.6°F | Ht 68.0 in | Wt 187.5 lb

## 2022-09-12 DIAGNOSIS — Z0000 Encounter for general adult medical examination without abnormal findings: Secondary | ICD-10-CM

## 2022-09-12 DIAGNOSIS — E785 Hyperlipidemia, unspecified: Secondary | ICD-10-CM

## 2022-09-12 DIAGNOSIS — I7121 Aneurysm of the ascending aorta, without rupture: Secondary | ICD-10-CM

## 2022-09-12 DIAGNOSIS — N400 Enlarged prostate without lower urinary tract symptoms: Secondary | ICD-10-CM

## 2022-09-12 DIAGNOSIS — Z23 Encounter for immunization: Secondary | ICD-10-CM | POA: Diagnosis not present

## 2022-09-12 DIAGNOSIS — I679 Cerebrovascular disease, unspecified: Secondary | ICD-10-CM

## 2022-09-12 DIAGNOSIS — G4733 Obstructive sleep apnea (adult) (pediatric): Secondary | ICD-10-CM

## 2022-09-12 DIAGNOSIS — K219 Gastro-esophageal reflux disease without esophagitis: Secondary | ICD-10-CM | POA: Diagnosis not present

## 2022-09-12 DIAGNOSIS — R001 Bradycardia, unspecified: Secondary | ICD-10-CM

## 2022-09-12 DIAGNOSIS — Z87891 Personal history of nicotine dependence: Secondary | ICD-10-CM

## 2022-09-12 DIAGNOSIS — G8929 Other chronic pain: Secondary | ICD-10-CM

## 2022-09-12 DIAGNOSIS — R7303 Prediabetes: Secondary | ICD-10-CM

## 2022-09-12 DIAGNOSIS — E559 Vitamin D deficiency, unspecified: Secondary | ICD-10-CM

## 2022-09-12 DIAGNOSIS — Z7189 Other specified counseling: Secondary | ICD-10-CM

## 2022-09-12 DIAGNOSIS — M545 Low back pain, unspecified: Secondary | ICD-10-CM

## 2022-09-12 DIAGNOSIS — J432 Centrilobular emphysema: Secondary | ICD-10-CM

## 2022-09-12 DIAGNOSIS — E538 Deficiency of other specified B group vitamins: Secondary | ICD-10-CM

## 2022-09-12 NOTE — Progress Notes (Signed)
Patient ID: Derrick Horn, male    DOB: 07-Sep-1961, 61 y.o.   MRN: [id number]  This visit was conducted in person.  BP 122/82   Pulse (!) 43   Temp 97.6 F (36.4 C) (Temporal)   Ht '5\' 8"'$  (1.727 m)   Wt 187 lb 8 oz (85 kg)   SpO2 95%   BMI 28.51 kg/m    CC: CPE Subjective:   HPI: Derrick Horn is a 61 y.o. 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 Not interested in evaluation at this time. He's right handed.   Fmhx CAD - father with stent and CABG Coronary CTA score of zero 07/2020 Echocardiogram normal 07/2020 Found to have mild ascending aortic dilation.  Chronic sinus bradycardia.  Last saw cards 09/2021.   Saw neurology for some memory concerns - did improve with starting CPAP 11/2018 for OSA ([place]). Saw Dr Jaynee Eagles again 01/2022 - MRI 2019 showed cerebrovascular small vessel disease - referred for neurocognitive evaluation - did not go -will monitor for now.   Preventative: COLONOSCOPY WITH PROPOFOL 09/15/2021 - TAs, SSP, diverticulosis, hemorrhoids, rpt 3 yrs [name]) Prostate - yearly screening with PSA. S/p green light laser TURP for BPH (2017)  Lung cancer screening - ~20 PY hx - CT lung cancer screen started 2021. Centrilobular and paraseptal emphysema with bronchiolitis and minimal parenchymal scarring without significant respiratory symptoms Flu shot - yearly  [place] 03/2020, 04/2020, booster 12/2020 Tdap 2011, Tdap today  Shingrix - 06/2021, 10/2021  Advanced directive - does have advanced directive. Would want wife debbie and daughter to be HCPOA. Wants to update HCPOA and will bring [place] a copy.  Seat belt use discussed  Sunscreen use discussed. No changing moles on skin.  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 Interim medical history since our last visit reviewed. Allergies and medications reviewed and updated. Outpatient Medications Prior to Visit  Medication Sig Dispense Refill   aspirin EC 81 MG tablet Take 1 tablet (81 mg total) by mouth daily.     Cholecalciferol (VITAMIN D3) 1000 units CAPS Take 1 capsule by mouth daily.     Coenzyme Q10 (CO Q 10 PO) Take 2 tablets by mouth 3 (three) times a week.     esomeprazole (NEXIUM) 20 MG capsule Take 2 capsules (40 mg total) by mouth daily at 12 noon.     Multiple Vitamin (MULTIVITAMIN ADULT) TABS Take 1 tablet by mouth daily.     polycarbophil (FIBERCON) 625 MG tablet Take 625 mg by mouth daily.     rosuvastatin (CRESTOR) 10 MG tablet Take 1 tablet (10 mg total) by mouth daily. (Patient taking differently: Take 10 mg by mouth 3 (three) times a week.) 90 tablet 3   vitamin B-12 (CYANOCOBALAMIN) 500 MCG tablet Take 1 tablet (500 mcg total) by mouth daily.     Apoaequorin (PREVAGEN) 10 MG CAPS Take 1 capsule by mouth daily.     No facility-administered medications prior to visit.  Per HPI unless specifically indicated in ROS section below Review of Systems  Constitutional:  Negative for activity change, appetite change, chills, fatigue, fever and unexpected weight change.  HENT:  Negative for hearing loss.   Eyes:  Negative for visual disturbance.  Respiratory:  Negative for cough, chest tightness, shortness of breath and wheezing.   Cardiovascular:  Negative for chest pain, palpitations and leg swelling.  Gastrointestinal:  Negative for abdominal distention, abdominal pain, blood in stool, constipation, diarrhea, nausea and vomiting.  Genitourinary:  Negative for difficulty urinating and  hematuria.  Musculoskeletal:  Negative for arthralgias, myalgias and neck pain.  Skin:  Negative for rash.  Neurological:  Negative for dizziness, seizures, syncope and headaches.  Hematological:  Negative for adenopathy. Does not bruise/bleed easily.  Psychiatric/Behavioral:  Negative for dysphoric mood. The patient is not nervous/anxious.     Objective:  BP 122/82   Pulse (!) 43   Temp 97.6 F (36.4 C) (Temporal)   Ht '5\' 8"'$  (1.727 m)   Wt 187 lb 8 oz (85 kg)   SpO2 95%   BMI 28.51 kg/m   Wt Readings from Last 3 Encounters:  09/12/22 187 lb 8 oz (85 kg)  02/02/22 196 lb 12.8 oz (89.3 kg)  09/26/21 190 lb (86.2 kg)      Physical Exam Vitals and nursing note reviewed.  Constitutional:      General: He is not in acute distress.    Appearance: Normal appearance. He is well-developed. He is not ill-appearing.  HENT:     Head: Normocephalic and atraumatic.     Right Ear: Hearing, tympanic membrane, ear canal and external ear normal.     Left Ear: Hearing, tympanic membrane, ear canal and external ear normal.  Eyes:     General: No scleral icterus.    Extraocular Movements: Extraocular movements intact.     Conjunctiva/sclera: Conjunctivae normal.     Pupils: Pupils are equal, round, and reactive to light.  Neck:     Thyroid: No thyroid mass or thyromegaly.     Vascular: No carotid bruit.  Cardiovascular:     Rate and Rhythm: Normal rate and regular rhythm.     Pulses: Normal pulses.          Radial pulses are 2+ on the right side and 2+ on the left side.     Heart sounds: Normal heart sounds. No murmur heard. Pulmonary:     Effort: Pulmonary effort is normal. No respiratory distress.     Breath sounds: Normal breath sounds. No wheezing, rhonchi or rales.  Abdominal:     General: Bowel sounds are normal. There is no distension.     Palpations: Abdomen is soft. There is no mass.     Tenderness: There is no abdominal tenderness. There is no guarding or rebound.      Hernia: No hernia is present.  Musculoskeletal:        General: Normal range of motion.     Cervical back: Normal range of motion and neck supple.     Right lower leg: No edema.     Left lower leg: No edema.  Lymphadenopathy:     Cervical: No cervical adenopathy.  Skin:    General: Skin is warm and dry.     Findings: No rash.  Neurological:     General: No focal deficit present.     Mental Status: He is alert and oriented to person, place, and time.  Psychiatric:  Mood and Affect: Mood normal.        Behavior: Behavior normal.        Thought Content: Thought content normal.        Judgment: Judgment normal.       Results for orders placed or performed in visit on 09/05/22  Hemoglobin A1c  Result Value Ref Range   Hgb A1c MFr Bld 5.8 4.6 - 6.5 %  VITAMIN D 25 Hydroxy (Vit-D Deficiency, Fractures)  Result Value Ref Range   VITD 38.32 30.00 - 100.00 ng/mL  Vitamin B12  Result Value Ref Range   Vitamin B-12 790 211 - 911 pg/mL  PSA  Result Value Ref Range   PSA 1.31 0.10 - 4.00 ng/mL  Comprehensive metabolic panel  Result Value Ref Range   Sodium 139 135 - 145 mEq/L   Potassium 4.2 3.5 - 5.1 mEq/L   Chloride 105 96 - 112 mEq/L   CO2 29 19 - 32 mEq/L   Glucose, Bld 120 (H) 70 - 99 mg/dL   BUN 14 6 - 23 mg/dL   Creatinine, Ser 0.99 0.40 - 1.50 mg/dL   Total Bilirubin 0.8 0.2 - 1.2 mg/dL   Alkaline Phosphatase 71 39 - 117 U/L   AST 14 0 - 37 U/L   ALT 17 0 - 53 U/L   Total Protein 6.7 6.0 - 8.3 g/dL   Albumin 4.1 3.5 - 5.2 g/dL   GFR 81.79 >60.00 mL/min   Calcium 9.2 8.4 - 10.5 mg/dL  Lipid panel  Result Value Ref Range   Cholesterol 123 0 - 200 mg/dL   Triglycerides 83.0 0.0 - 149.0 mg/dL   HDL 48.80 >39.00 mg/dL   VLDL 16.6 0.0 - 40.0 mg/dL   LDL Cholesterol 58 0 - 99 mg/dL   Total CHOL/HDL Ratio 3    NonHDL 74.45     Assessment & Plan:   Problem List Items Addressed This Visit     Healthcare maintenance - Primary (Chronic)    Preventative  protocols reviewed and updated unless pt declined. Discussed healthy diet and lifestyle.       Advanced directives, counseling/discussion (Chronic)    Advanced directive - does have advanced directive. Would want wife debbie and daughter to be HCPOA. Wants to update HCPOA and will bring [place] a copy.       Ex-smoker    Remains abstinent. Continues lung cancer screening program.      GERD    Continues esomeprazole '20mg'$  2 tab OTC daily.       LOW BACK PAIN, CHRONIC    Followed by Rosanne Gutting, known scoliosis and OA.       Dyslipidemia    Chronic, followed by cardiology. Takes crestor MWF. Rec try coq10 daily due to leg pains.  The ASCVD Risk score (Arnett DK, et al., 2019) failed to calculate for the following reasons:   The valid total cholesterol range is 130 to 320 mg/dL       BPH (benign prostatic hyperplasia)    Stable period s/p TURP      Cerebrovascular small vessel disease   OSA on CPAP    Seeing neuro regularly. Appreciate their care.       B12 deficiency    Continue b12 daily.       Thoracic ascending aortic aneurysm    Regularly sees cardiology.       COPD (chronic obstructive pulmonary disease) ([place])    By imaging - pt asxs      Vitamin D  deficiency    Continue vit D daily.       Prediabetes    Reviewed #s with patient, encouraged limiting added sugars/carbs.      Sinus bradycardia    Chronic, overall stable as pt asxs.       Other Visit Diagnoses     Need for Tdap vaccination       Relevant Orders   Tdap vaccine greater than or equal to 7yo IM (Completed)        No orders of the defined types were placed in this encounter.  Orders Placed This Encounter  Procedures   Tdap vaccine greater than or equal to 7yo IM     Patient instructions: Tdap today  You are doing well today.  Let me know if any ongoing memory trouble - to consider neurocognitive evaluation.  Return as needed or in 1 year for next physical.   Follow up  plan: Return in about 1 year (around 09/13/2023) for annual exam, prior fasting for blood work.  Ria Bush, MD

## 2022-09-12 NOTE — Assessment & Plan Note (Signed)
Continue vit D daily.

## 2022-09-12 NOTE — Assessment & Plan Note (Signed)
Seeing neuro regularly. Appreciate their care.

## 2022-09-12 NOTE — Assessment & Plan Note (Signed)
Regularly sees cardiology 

## 2022-09-12 NOTE — Patient Instructions (Addendum)
Tdap today  You are doing well today.  Let me know if any ongoing memory trouble - to consider neurocognitive evaluation.  Return as needed or in 1 year for next physical.   Health Maintenance, Male Adopting a healthy lifestyle and getting preventive care are important in promoting health and wellness. Ask your health care provider about: The right schedule for you to have regular tests and exams. Things you can do on your own to prevent diseases and keep yourself healthy. What should I know about diet, weight, and exercise? Eat a healthy diet  Eat a diet that includes plenty of vegetables, fruits, low-fat dairy products, and lean protein. Do not eat a lot of foods that are high in solid fats, added sugars, or sodium. Maintain a healthy weight Body mass index (BMI) is a measurement that can be used to identify possible weight problems. It estimates body fat based on height and weight. Your health care provider can help determine your BMI and help you achieve or maintain a healthy weight. Get regular exercise Get regular exercise. This is one of the most important things you can do for your health. Most adults should: Exercise for at least 150 minutes each week. The exercise should increase your heart rate and make you sweat (moderate-intensity exercise). Do strengthening exercises at least twice a week. This is in addition to the moderate-intensity exercise. Spend less time sitting. Even light physical activity can be beneficial. Watch cholesterol and blood lipids Have your blood tested for lipids and cholesterol at 62 years of age, then have this test every 5 years. You may need to have your cholesterol levels checked more often if: Your lipid or cholesterol levels are high. You are older than 62 years of age. You are at high risk for heart disease. What should I know about cancer screening? Many types of cancers can be detected early and may often be prevented. Depending on your health  history and family history, you may need to have cancer screening at various ages. This may include screening for: Colorectal cancer. Prostate cancer. Skin cancer. Lung cancer. What should I know about heart disease, diabetes, and high blood pressure? Blood pressure and heart disease High blood pressure causes heart disease and increases the risk of stroke. This is more likely to develop in people who have high blood pressure readings or are overweight. Talk with your health care provider about your target blood pressure readings. Have your blood pressure checked: Every 3-5 years if you are 31-40 years of age. Every year if you are 25 years old or older. If you are between the ages of 13 and 27 and are a current or former smoker, ask your health care provider if you should have a one-time screening for abdominal aortic aneurysm (AAA). Diabetes Have regular diabetes screenings. This checks your fasting blood sugar level. Have the screening done: Once every three years after age 53 if you are at a normal weight and have a low risk for diabetes. More often and at a younger age if you are overweight or have a high risk for diabetes. What should I know about preventing infection? Hepatitis B If you have a higher risk for hepatitis B, you should be screened for this virus. Talk with your health care provider to find out if you are at risk for hepatitis B infection. Hepatitis C Blood testing is recommended for: Everyone born from [birth year] through 1965. Anyone with known risk factors for hepatitis C. Sexually transmitted infections (STIs)  You should be screened each year for STIs, including gonorrhea and chlamydia, if: You are sexually active and are younger than 62 years of age. You are older than 62 years of age and your health care provider tells you that you are at risk for this type of infection. Your sexual activity has changed since you were last screened, and you are at increased risk for  chlamydia or gonorrhea. Ask your health care provider if you are at risk. Ask your health care provider about whether you are at high risk for HIV. Your health care provider may recommend a prescription medicine to help prevent HIV infection. If you choose to take medicine to prevent HIV, you should first get tested for HIV. You should then be tested every 3 months for as long as you are taking the medicine. Follow these instructions at home: Alcohol use Do not drink alcohol if your health care provider tells you not to drink. If you drink alcohol: Limit how much you have to 0-2 drinks a day. Know how much alcohol is in your drink. In the [place] one drink equals one 12 oz bottle of beer (355 mL), one 5 oz glass of wine (148 mL), or one 1 oz glass of hard liquor (44 mL). Lifestyle Do not use any products that contain nicotine or tobacco. These products include cigarettes, chewing tobacco, and vaping devices, such as e-cigarettes. If you need help quitting, ask your health care provider. Do not use street drugs. Do not share needles. Ask your health care provider for help if you need support or information about quitting drugs. General instructions Schedule regular health, dental, and eye exams. Stay current with your vaccines. Tell your health care provider if: You often feel depressed. You have ever been abused or do not feel safe at home. Summary Adopting a healthy lifestyle and getting preventive care are important in promoting health and wellness. Follow your health care provider's instructions about healthy diet, exercising, and getting tested or screened for diseases. Follow your health care provider's instructions on monitoring your cholesterol and blood pressure. This information is not intended to replace advice given to you by your health care provider. Make sure you discuss any questions you have with your health care provider. Document Revised: 04/18/2021 Document Reviewed:  04/18/2021 Elsevier Patient Education  [employer].

## 2022-09-12 NOTE — Assessment & Plan Note (Signed)
Chronic, followed by cardiology. Takes crestor MWF. Rec try coq10 daily due to leg pains.  The ASCVD Risk score (Arnett DK, et al., 2019) failed to calculate for the following reasons:   The valid total cholesterol range is 130 to 320 mg/dL

## 2022-09-12 NOTE — Assessment & Plan Note (Signed)
Chronic, overall stable as pt asxs.

## 2022-09-12 NOTE — Assessment & Plan Note (Signed)
Advanced directive - does have advanced directive. Would want wife debbie and daughter to be HCPOA. Wants to update HCPOA and will bring [place] a copy.

## 2022-09-12 NOTE — Assessment & Plan Note (Addendum)
Continues esomeprazole '20mg'$  2 tab OTC daily.

## 2022-09-12 NOTE — Assessment & Plan Note (Signed)
Reviewed #s with patient, encouraged limiting added sugars/carbs.

## 2022-09-12 NOTE — Assessment & Plan Note (Signed)
Remains abstinent. Continues lung cancer screening program.

## 2022-09-12 NOTE — Assessment & Plan Note (Addendum)
Followed by Rosanne Gutting, known scoliosis and OA.

## 2022-09-12 NOTE — Assessment & Plan Note (Signed)
Continue b12 daily.

## 2022-09-12 NOTE — Assessment & Plan Note (Signed)
By imaging - pt asxs

## 2022-09-12 NOTE — Assessment & Plan Note (Signed)
Preventative protocols reviewed and updated unless pt declined. Discussed healthy diet and lifestyle.  

## 2022-09-12 NOTE — Assessment & Plan Note (Signed)
Stable period s/p TURP

## 2022-10-24 DIAGNOSIS — M5459 Other low back pain: Secondary | ICD-10-CM | POA: Diagnosis not present

## 2022-10-24 DIAGNOSIS — M5136 Other intervertebral disc degeneration, lumbar region: Secondary | ICD-10-CM | POA: Diagnosis not present

## 2022-10-24 DIAGNOSIS — M5451 Vertebrogenic low back pain: Secondary | ICD-10-CM | POA: Diagnosis not present

## 2022-11-01 ENCOUNTER — Telehealth: Payer: BC Managed Care – PPO | Admitting: Physician Assistant

## 2022-11-01 DIAGNOSIS — B9689 Other specified bacterial agents as the cause of diseases classified elsewhere: Secondary | ICD-10-CM | POA: Diagnosis not present

## 2022-11-01 DIAGNOSIS — J019 Acute sinusitis, unspecified: Secondary | ICD-10-CM

## 2022-11-01 MED ORDER — DOXYCYCLINE HYCLATE 100 MG PO TABS: 100.0000 mg | ORAL_TABLET | Freq: Two times a day (BID) | ORAL | 0 refills | Status: DC

## 2022-11-01 MED ORDER — FLUTICASONE PROPIONATE 50 MCG/ACT NA SUSP: 2.0000 | Freq: Every day | NASAL | 0 refills | Status: DC

## 2022-11-01 NOTE — Patient Instructions (Signed)
Derrick Horn, thank you for joining [name], PA-C for today's virtual visit.  While this provider is not your primary care provider (PCP), if your PCP is located in our provider database this encounter information will be shared with them immediately following your visit.   [place] account gives you access to today's visit and all your visits, tests, and labs performed at [facility] " click here if you don't have a [facility] account or go to [email or web address]  Consent: (Patient) Derrick Horn provided verbal consent for this virtual visit at the beginning of the encounter.  Current Medications:  Current Outpatient Medications:    doxycycline (VIBRA-TABS) 100 MG tablet, Take 1 tablet (100 mg total) by mouth 2 (two) times daily., Disp: 20 tablet, Rfl: 0   fluticasone (FLONASE) 50 MCG/ACT nasal spray, Place 2 sprays into both nostrils daily., Disp: 16 g, Rfl: 0   aspirin EC 81 MG tablet, Take 1 tablet (81 mg total) by mouth daily., Disp: , Rfl:    Cholecalciferol (VITAMIN D3) 1000 units CAPS, Take 1 capsule by mouth daily., Disp: , Rfl:    Coenzyme Q10 (CO Q 10 PO), Take 2 tablets by mouth 3 (three) times a week., Disp: , Rfl:    esomeprazole (NEXIUM) 20 MG capsule, Take 2 capsules (40 mg total) by mouth daily at 12 noon., Disp: , Rfl:    Multiple Vitamin (MULTIVITAMIN ADULT) TABS, Take 1 tablet by mouth daily., Disp: , Rfl:    polycarbophil (FIBERCON) 625 MG tablet, Take 625 mg by mouth daily., Disp: , Rfl:    rosuvastatin (CRESTOR) 10 MG tablet, Take 1 tablet (10 mg total) by mouth daily. (Patient taking differently: Take 10 mg by mouth 3 (three) times a week.), Disp: 90 tablet, Rfl: 3   vitamin B-12 (CYANOCOBALAMIN) 500 MCG tablet, Take 1 tablet (500 mcg total) by mouth daily., Disp: , Rfl:    Medications ordered in this encounter:  Meds ordered this encounter  Medications   fluticasone (FLONASE) 50 MCG/ACT nasal spray    Sig:  Place 2 sprays into both nostrils daily.    Dispense:  16 g    Refill:  0    Order Specific Question:   Supervising Provider    Answer:   Chase Picket [id number]   doxycycline (VIBRA-TABS) 100 MG tablet    Sig: Take 1 tablet (100 mg total) by mouth 2 (two) times daily.    Dispense:  20 tablet    Refill:  0    Order Specific Question:   Supervising Provider    Answer:   Chase Picket [id number]     *If you need refills on other medications prior to your next appointment, please contact your pharmacy*  Follow-Up: Call back or seek an in-person evaluation if the symptoms worsen or if the condition fails to improve as anticipated.  [facility] [phone]  Other Instructions Please take antibiotic as directed.  Increase fluid intake.  Use Saline nasal spray.  Take a daily multivitamin. Use the Flonase as directed.  Place a humidifier in the bedroom.  Please call or return clinic if symptoms are not improving.  Sinusitis Sinusitis is redness, soreness, and swelling (inflammation) of the paranasal sinuses. Paranasal sinuses are air pockets within the bones of your face (beneath the eyes, the middle of the forehead, or above the eyes). In healthy paranasal sinuses, mucus is able to drain out, and air is able to circulate through  them by way of your nose. However, when your paranasal sinuses are inflamed, mucus and air can become trapped. This can allow bacteria and other germs to grow and cause infection. Sinusitis can develop quickly and last only a short time (acute) or continue over a long period (chronic). Sinusitis that lasts for more than 12 weeks is considered chronic.  CAUSES  Causes of sinusitis include: Allergies. Structural abnormalities, such as displacement of the cartilage that separates your nostrils (deviated septum), which can decrease the air flow through your nose and sinuses and affect sinus drainage. Functional abnormalities, such as when the small  hairs (cilia) that line your sinuses and help remove mucus do not work properly or are not present. SYMPTOMS  Symptoms of acute and chronic sinusitis are the same. The primary symptoms are pain and pressure around the affected sinuses. Other symptoms include: Upper toothache. Earache. Headache. Bad breath. Decreased sense of smell and taste. A cough, which worsens when you are lying flat. Fatigue. Fever. Thick drainage from your nose, which often is green and may contain pus (purulent). Swelling and warmth over the affected sinuses. DIAGNOSIS  Your caregiver will perform a physical exam. During the exam, your caregiver may: Look in your nose for signs of abnormal growths in your nostrils (nasal polyps). Tap over the affected sinus to check for signs of infection. View the inside of your sinuses (endoscopy) with a special imaging device with a light attached (endoscope), which is inserted into your sinuses. If your caregiver suspects that you have chronic sinusitis, one or more of the following tests may be recommended: Allergy tests. Nasal culture A sample of mucus is taken from your nose and sent to a lab and screened for bacteria. Nasal cytology A sample of mucus is taken from your nose and examined by your caregiver to determine if your sinusitis is related to an allergy. TREATMENT  Most cases of acute sinusitis are related to a viral infection and will resolve on their own within 10 days. Sometimes medicines are prescribed to help relieve symptoms (pain medicine, decongestants, nasal steroid sprays, or saline sprays).  However, for sinusitis related to a bacterial infection, your caregiver will prescribe antibiotic medicines. These are medicines that will help kill the bacteria causing the infection.  Rarely, sinusitis is caused by a fungal infection. In theses cases, your caregiver will prescribe antifungal medicine. For some cases of chronic sinusitis, surgery is needed. Generally,  these are cases in which sinusitis recurs more than 3 times per year, despite other treatments. HOME CARE INSTRUCTIONS  Drink plenty of water. Water helps thin the mucus so your sinuses can drain more easily. Use a humidifier. Inhale steam 3 to 4 times a day (for example, sit in the bathroom with the shower running). Apply a warm, moist washcloth to your face 3 to 4 times a day, or as directed by your caregiver. Use saline nasal sprays to help moisten and clean your sinuses. Take over-the-counter or prescription medicines for pain, discomfort, or fever only as directed by your caregiver. SEEK IMMEDIATE MEDICAL CARE IF: You have increasing pain or severe headaches. You have nausea, vomiting, or drowsiness. You have swelling around your face. You have vision problems. You have a stiff neck. You have difficulty breathing. MAKE SURE YOU:  Understand these instructions. Will watch your condition. Will get help right away if you are not doing well or get worse. Document Released: 11/27/2005 Document Revised: 02/19/2012 Document Reviewed: 12/12/2011 [facility] Patient Information 2014 [place], [place].  If you have been instructed to have an in-person evaluation today at a local Urgent Care facility, please use the link below. It will take you to a list of all of our available [facility], including address, phone number and hours of operation. Please do not delay care.  [facility]  If you or a family member do not have a primary care provider, use the link below to schedule a visit and establish care. When you choose a [facility] primary care physician or advanced practice provider, you gain a long-term partner in health. Find a Primary Care Provider  Learn more about [place]'s in-office and virtual care options: [facility]

## 2022-11-01 NOTE — Progress Notes (Signed)
Virtual Visit Consent   Derrick Horn, you are scheduled for a virtual visit with a [facility] provider today. Just as with appointments in the office, your consent must be obtained to participate. Your consent will be active for this visit and any virtual visit you may have with one of our providers in the next 365 days. If you have a MyChart account, a copy of this consent can be sent to you electronically.  As this is a virtual visit, video technology does not allow for your provider to perform a traditional examination. This may limit your provider's ability to fully assess your condition. If your provider identifies any concerns that need to be evaluated in person or the need to arrange testing (such as labs, EKG, etc.), we will make arrangements to do so. Although advances in technology are sophisticated, we cannot ensure that it will always work on either your end or our end. If the connection with a video visit is poor, the visit may have to be switched to a telephone visit. With either a video or telephone visit, we are not always able to ensure that we have a secure connection.  By engaging in this virtual visit, you consent to the provision of healthcare and authorize for your insurance to be billed (if applicable) for the services provided during this visit. Depending on your insurance coverage, you may receive a charge related to this service.  I need to obtain your verbal consent now. Are you willing to proceed with your visit today? NAYIB Horn has provided verbal consent on 11/01/2022 for a virtual visit (video or telephone). Derrick Horn, [place]  Date: 11/01/2022 10:17 AM  Virtual Visit via Video Note   I, Derrick Horn, connected with  Derrick Horn  ([id number], 1961/08/20) on 11/01/22 at 10:00 AM EST by a video-enabled telemedicine application and verified that I am speaking with the correct person using two identifiers.  Location: Patient: Virtual Visit  Location Patient: Home Provider: Virtual Visit Location Provider: Home Office   I discussed the limitations of evaluation and management by telemedicine and the availability of in person appointments. The patient expressed understanding and agreed to proceed.    History of Present Illness: Derrick Horn is a 61 y.o. who identifies as a male who was assigned male at birth, and is being seen today for possible sinusitis. Endorses starting last week with nasal and head congestion, sinus pressure and sore throat. Now with occasional chills, increased congestion and sinus pain. Took COVID test which was negative. Has history of sinus infections and this feels similar.  HPI: HPI  Problems:  Patient Active Problem List   Diagnosis Date Noted   Advanced directives, counseling/discussion 09/12/2022   Sinus bradycardia 09/12/2022   Vitamin D deficiency 09/05/2022   Prediabetes 09/05/2022   COPD (chronic obstructive pulmonary disease) ([employer]) 10/06/2021   Dependence on CPAP ventilation 09/26/2021   COVID-19 virus infection 04/08/2021   B12 deficiency 06/23/2020   Thoracic ascending aortic aneurysm 06/23/2020   Pedal edema 12/18/2018   OSA on CPAP 11/26/2018   Cerebrovascular small vessel disease 11/21/2018   Memory loss 11/18/2018   Tinnitus of both ears 11/18/2018   Vertigo 11/18/2018   Rotator cuff tear arthropathy, right 03/03/2018   BPH (benign prostatic hyperplasia) 03/31/2016   Decreased hearing of both ears 12/29/2015   Dysphagia 12/29/2015   Sensorineural hearing loss (SNHL) of both ears 12/12/2015   Healthcare maintenance 07/14/2011   Dyslipidemia 07/14/2011   LOW  BACK PAIN, CHRONIC 08/25/2010   Overweight 08/04/2010   Ex-smoker 08/04/2010   GERD 08/04/2010    Allergies:  Allergies  Allergen Reactions   Azithromycin Other (See Comments)    bradycardia, hypotension,syncope, 3 days in hospital   Pyridium [Phenazopyridine Hcl]     Nausea/vomiting   Medications:  Current  Outpatient Medications:    doxycycline (VIBRA-TABS) 100 MG tablet, Take 1 tablet (100 mg total) by mouth 2 (two) times daily., Disp: 20 tablet, Rfl: 0   fluticasone (FLONASE) 50 MCG/ACT nasal spray, Place 2 sprays into both nostrils daily., Disp: 16 g, Rfl: 0   aspirin EC 81 MG tablet, Take 1 tablet (81 mg total) by mouth daily., Disp: , Rfl:    Cholecalciferol (VITAMIN D3) 1000 units CAPS, Take 1 capsule by mouth daily., Disp: , Rfl:    Coenzyme Q10 (CO Q 10 PO), Take 2 tablets by mouth 3 (three) times a week., Disp: , Rfl:    esomeprazole (NEXIUM) 20 MG capsule, Take 2 capsules (40 mg total) by mouth daily at 12 noon., Disp: , Rfl:    Multiple Vitamin (MULTIVITAMIN ADULT) TABS, Take 1 tablet by mouth daily., Disp: , Rfl:    polycarbophil (FIBERCON) 625 MG tablet, Take 625 mg by mouth daily., Disp: , Rfl:    rosuvastatin (CRESTOR) 10 MG tablet, Take 1 tablet (10 mg total) by mouth daily. (Patient taking differently: Take 10 mg by mouth 3 (three) times a week.), Disp: 90 tablet, Rfl: 3   vitamin B-12 (CYANOCOBALAMIN) 500 MCG tablet, Take 1 tablet (500 mcg total) by mouth daily., Disp: , Rfl:   Observations/Objective: Patient is well-developed, well-nourished in no acute distress.  Resting comfortably at home.  Head is normocephalic, atraumatic.  No labored breathing.  Speech is clear and coherent with logical content.  Patient is alert and oriented at baseline.   Assessment and Plan: 1. Acute bacterial sinusitis - fluticasone (FLONASE) 50 MCG/ACT nasal spray; Place 2 sprays into both nostrils daily.  Dispense: 16 g; Refill: 0 - doxycycline (VIBRA-TABS) 100 MG tablet; Take 1 tablet (100 mg total) by mouth 2 (two) times daily.  Dispense: 20 tablet; Refill: 0  Rx Doxycycline.  Increase fluids.  Rest.  Saline nasal spray.  Probiotic.  Mucinex as directed.  Humidifier in bedroom. Flonase per orders.  Call or return to clinic if symptoms are not improving.   Follow Up Instructions: I discussed  the assessment and treatment plan with the patient. The patient was provided an opportunity to ask questions and all were answered. The patient agreed with the plan and demonstrated an understanding of the instructions.  A copy of instructions were sent to the patient via MyChart unless otherwise noted below.   The patient was advised to call back or seek an in-person evaluation if the symptoms worsen or if the condition fails to improve as anticipated.  Time:  I spent 10 minutes with the patient via telehealth technology discussing the above problems/concerns.    Derrick Rio, PA-C

## 2022-11-03 ENCOUNTER — Other Ambulatory Visit: Payer: Self-pay | Admitting: Cardiovascular Disease

## 2022-12-27 DIAGNOSIS — M25512 Pain in left shoulder: Secondary | ICD-10-CM | POA: Diagnosis not present

## 2023-01-02 ENCOUNTER — Telehealth: Payer: Self-pay | Admitting: Neurology

## 2023-01-02 NOTE — Telephone Encounter (Signed)
After checking DPR phone rep left a vm, asking pt to call to schedule an appointment, office # left on vm.

## 2023-01-02 NOTE — Telephone Encounter (Signed)
Pt said need prescription for CPAP supplies. Do not want to come in for an appt. Would like a call from the nurse.

## 2023-01-02 NOTE — Telephone Encounter (Signed)
Pt has not been since for sleep in over a yr since Oct 2022. He must be seen, please schedule appt. Can be VV with a NP.

## 2023-01-02 NOTE — Telephone Encounter (Signed)
Noted  

## 2023-01-06 DIAGNOSIS — M5451 Vertebrogenic low back pain: Secondary | ICD-10-CM | POA: Diagnosis not present

## 2023-01-10 DIAGNOSIS — M5451 Vertebrogenic low back pain: Secondary | ICD-10-CM | POA: Diagnosis not present

## 2023-01-14 ENCOUNTER — Other Ambulatory Visit: Payer: Self-pay | Admitting: Cardiovascular Disease

## 2023-01-16 NOTE — Progress Notes (Unsigned)
No chief complaint on file.  History of Present Illness: 62 yo male with history of thoracic aortic aneurysm, mild CAD, hyperlipidemia, sleep apnea, GERD and former tobacco abuse here today for cardiac follow up. Coronary artery CT in August 2021 with minimal LAD plaque, calcium score zero. Echo August 2021 with LVEF=60-65%. No valve disease. Dilation of the ascending aorta with stable 4.4 cm ascending thoracic aorta by CT in September 2023.   He is here today for follow up. The patient denies any chest pain, dyspnea, palpitations, lower extremity edema, orthopnea, PND, dizziness, near syncope or syncope.   Primary Care Physician: Ria Bush, MD  Past Medical History:  Diagnosis Date   Chronic LBP    COPD (chronic obstructive pulmonary disease) ([employer])    minimal emphysema on CT scan   GERD (gastroesophageal reflux disease)    mild   Headache    silent migraine attack age 2's   History of chest pain 2010   stress test WNL   HLD (hyperlipidemia)    mild   Recurrent sinus infections    Sensorineural hearing loss (SNHL) of both ears 12/2015   mild-mod, no need for aides, rec melatoonin and lipoflavonoid [facility])   Sleep apnea 2020   CPap   Tear of left biceps muscle 01/19/2014    Past Surgical History:  Procedure Laterality Date   BICEPS TENDON REPAIR  2015   [name]   CARPAL TUNNEL RELEASE     right   chest CT  2011   no pulm nodules, mild apical scarring/emphysematous changes   COLONOSCOPY  09/2010   2 hyperplastic polyps [name])   COLONOSCOPY WITH PROPOFOL  09/15/2021   TAs, SSP, diverticulosis, hemorrhoids, rpt 3 yrs ([facility])   ETT  2010   WNL (Dr. Johnsie Cancel)   GREEN LIGHT LASER TURP (TRANSURETHRAL RESECTION OF PROSTATE N/A 05/30/2016   Procedure: GREEN LIGHT LASER TURP (TRANSURETHRAL RESECTION OF PROSTATE;  Surgeon: Royston Cowper, MD;  Location: [facility] ORS;  Service: Urology;  Laterality: N/A;   SHOULDER ARTHROSCOPY WITH SUBACROMIAL DECOMPRESSION, ROTATOR  CUFF REPAIR AND BICEP TENDON REPAIR  04/2018   Dr Onnie Graham    Current Outpatient Medications  Medication Sig Dispense Refill   aspirin EC 81 MG tablet Take 1 tablet (81 mg total) by mouth daily.     Cholecalciferol (VITAMIN D3) 1000 units CAPS Take 1 capsule by mouth daily.     Coenzyme Q10 (CO Q 10 PO) Take 2 tablets by mouth 3 (three) times a week.     doxycycline (VIBRA-TABS) 100 MG tablet Take 1 tablet (100 mg total) by mouth 2 (two) times daily. 20 tablet 0   esomeprazole (NEXIUM) 20 MG capsule Take 2 capsules (40 mg total) by mouth daily at 12 noon.     fluticasone (FLONASE) 50 MCG/ACT nasal spray Place 2 sprays into both nostrils daily. 16 g 0   Multiple Vitamin (MULTIVITAMIN ADULT) TABS Take 1 tablet by mouth daily.     polycarbophil (FIBERCON) 625 MG tablet Take 625 mg by mouth daily.     rosuvastatin (CRESTOR) 10 MG tablet TAKE 1 TABLET BY MOUTH 3 TIMES A WEEK. 30 tablet 0   vitamin B-12 (CYANOCOBALAMIN) 500 MCG tablet Take 1 tablet (500 mcg total) by mouth daily.     No current facility-administered medications for this visit.    Allergies  Allergen Reactions   Azithromycin Other (See Comments)    bradycardia, hypotension,syncope, 3 days in hospital   Pyridium [Phenazopyridine Hcl]  Nausea/vomiting    Social History   Socioeconomic History   Marital status: Married    Spouse name: Not on file   Number of children: 1   Years of education: Not on file   Highest education level: Not on file  Occupational History   Occupation: Sales  Tobacco Use   Smoking status: Former    Packs/day: 0.25    Years: 30.00    Total pack years: 7.50    Types: Cigarettes    Quit date: 11/15/2018    Years since quitting: 4.1   Smokeless tobacco: Never  Vaping Use   Vaping Use: Never used  Substance and Sexual Activity   Alcohol use: Never   Drug use: Never   Sexual activity: Yes  Other Topics Concern   Not on file  Social History Narrative   Caffeine: 1 cup coffee/day    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   1 dog   Activity: shooting, yardwork, no regular exercise   Diet: more salads, more oatmeal, good water   Social Determinants of Health   Financial Resource Strain: Not on file  Food Insecurity: Not on file  Transportation Needs: Not on file  Physical Activity: Not on file  Stress: Not on file  Social Connections: Not on file  Intimate Partner Violence: Not on file    Family History  Problem Relation Age of Onset   Coronary artery disease Father 79       s/p stents, CABG   Prostate cancer Father 28       doing well   Cancer Maternal Grandmother        uterine   Coronary artery disease Maternal Grandfather 35   Stroke Maternal Grandfather    Diabetes Neg Hx    Colon cancer Neg Hx    Colon polyps Neg Hx    Esophageal cancer Neg Hx    Rectal cancer Neg Hx    Stomach cancer Neg Hx    Memory loss Neg Hx    Dementia Neg Hx     Review of Systems:  As stated in the HPI and otherwise negative.   There were no vitals taken for this visit.  Physical Examination: General: Well developed, well nourished, NAD  HEENT: OP clear, mucus membranes moist  SKIN: warm, dry. No rashes. Neuro: No focal deficits  Musculoskeletal: Muscle strength 5/5 all ext  Psychiatric: Mood and affect normal  Neck: No JVD, no carotid bruits, no thyromegaly, no lymphadenopathy.  Lungs:Clear bilaterally, no wheezes, rhonci, crackles Cardiovascular: Regular rate and rhythm. No murmurs, gallops or rubs. Abdomen:Soft. Bowel sounds present. Non-tender.  Extremities: No lower extremity edema. Pulses are 2 + in the bilateral DP/PT.  EKG:  EKG is *** ordered today. The ekg ordered today demonstrates  Echo 07/21/20:  1. Left ventricular ejection fraction, by estimation, is 60 to 65%. The  left ventricle has normal function. The left ventricle has no regional  wall motion abnormalities. Left ventricular  diastolic parameters were  normal.   2. Right ventricular systolic function is normal. The right ventricular  size is mildly enlarged. There is normal pulmonary artery systolic  pressure.   3. The mitral valve is normal in structure. No evidence of mitral valve  regurgitation. No evidence of mitral stenosis.   4. The aortic valve is normal in structure. Aortic valve regurgitation is  not visualized. No aortic stenosis is present.   5. The inferior vena  cava is normal in size with greater than 50%  respiratory variability, suggesting right atrial pressure of 3 mmHg.   Coronary CTA 08/04/20: Aorta: Borderline dilated ascending thoracic aorta at 20m in the double oblique view at the level of the pulmonary artery bifurcation. Scattered calcifications in the ascending thoracic aorta. No dissection.   Aortic Valve:  Trileaflet.  No calcifications.   Coronary Arteries:  Normal coronary origin.  Right dominance.   RCA is a large dominant artery that gives rise to PDA and PLVB. There is no plaque.   Left main is a large artery that gives rise to LAD and LCX arteries. There is no plaque.   LAD is a large vessel that gives rise to a very large branching diagonal. There is minimal sequential non-calcified plaque in the mid LAD after the takeoff of a large diagonal with associated stenosis of 0-24%.   LCX is a non-dominant artery that gives rise to one large OM1 branch. There is no plaque.   Other findings:   Normal pulmonary vein drainage into the left atrium.   Normal let atrial appendage without a thrombus.   Normal size of the pulmonary artery.   IMPRESSION: 1. Coronary calcium score of 0. This was 0 percentile for age and sex matched control.   2.  Normal coronary origin with right dominance.   3.  Minimal atherosclerosis of the mid LAD.  CAD RADS 1.   4.  Borderline dilated ascending thoracic aorta (356m.   4.  Consider non-atherosclerotic causes of chest  pain.  Recent Labs: 02/02/2022: Hemoglobin 16.5; Platelets 176 09/05/2022: ALT 17; BUN 14; Creatinine, Ser 0.99; Potassium 4.2; Sodium 139   Lipid Panel    Component Value Date/Time   CHOL 123 09/05/2022 0749   CHOL 141 02/02/2022 0822   CHOL 221 06/03/2011 0000   TRIG 83.0 09/05/2022 0749   TRIG 83 06/03/2011 0000   HDL 48.80 09/05/2022 0749   HDL 51 02/02/2022 0822   CHOLHDL 3 09/05/2022 0749   VLDL 16.6 09/05/2022 0749   LDLCALC 58 09/05/2022 0749   LDLCALC 73 02/02/2022 0822   LDLDIRECT 137 06/03/2011 0000     Wt Readings from Last 3 Encounters:  09/12/22 85 kg  02/02/22 89.3 kg  09/26/21 86.2 kg    Assessment and Plan:   1. CAD without angina: Mild CAD by coronary CTA in August 2021. Continue ASA and statin.   2. Thoracic aortic aneurysm: Non-contrast CT Sept 2023 with stable aneurysm, 4.4 cm. Repeat chest CT in September 2024.    3. Hyperlipidemia: LDL 58 in September 2023. Continue statin.   Labs/ tests ordered today include:  No orders of the defined types were placed in this encounter.  Disposition:   F/U with me in one year.   Signed, ChLauree ChandlerMD 01/16/2023 2:11 PM    [facility] [address]  [id number]: [phone]: [phone]

## 2023-01-17 ENCOUNTER — Encounter: Payer: Self-pay | Admitting: Cardiovascular Disease

## 2023-01-17 ENCOUNTER — Ambulatory Visit: Payer: BC Managed Care – PPO | Attending: Cardiovascular Disease | Admitting: Cardiovascular Disease

## 2023-01-17 VITALS — BP 110/78 | HR 42 | Ht 68.0 in | Wt 198.2 lb

## 2023-01-17 DIAGNOSIS — R001 Bradycardia, unspecified: Secondary | ICD-10-CM

## 2023-01-17 DIAGNOSIS — I2510 Atherosclerotic heart disease of native coronary artery without angina pectoris: Secondary | ICD-10-CM

## 2023-01-17 DIAGNOSIS — I77810 Thoracic aortic ectasia: Secondary | ICD-10-CM | POA: Diagnosis not present

## 2023-01-17 NOTE — Patient Instructions (Addendum)
Medication Instructions:  No changes *If you need a refill on your cardiac medications before your next appointment, please call your pharmacy*   Lab Work: None today - will need blood work before CT scan in September   Testing/Procedures: Chest CTA - please schedule for December   Follow-Up: At [facility], you and your health needs are our priority.  As part of our continuing mission to provide you with exceptional heart care, we have created designated Provider Care Teams.  These Care Teams include your primary Cardiologist (physician) and Advanced Practice Providers (APPs -  Physician Assistants and Nurse Practitioners) who all work together to provide you with the care you need, when you need it.   Your next appointment:   12 month(s)  Provider:   Lauree Chandler, MD

## 2023-01-24 ENCOUNTER — Ambulatory Visit: Payer: BC Managed Care – PPO | Admitting: Family Medicine

## 2023-01-24 ENCOUNTER — Other Ambulatory Visit: Payer: Self-pay | Admitting: Family Medicine

## 2023-01-24 ENCOUNTER — Ambulatory Visit (INDEPENDENT_AMBULATORY_CARE_PROVIDER_SITE_OTHER)
Admission: RE | Admit: 2023-01-24 | Discharge: 2023-01-24 | Disposition: A | Discharge status: Home / Self Care | Payer: BC Managed Care – PPO | Source: Ambulatory Visit | Attending: Family Medicine | Admitting: Family Medicine

## 2023-01-24 ENCOUNTER — Encounter: Payer: Self-pay | Admitting: Family Medicine

## 2023-01-24 VITALS — BP 120/76 | HR 46 | Temp 97.2°F | Ht 68.0 in | Wt 195.1 lb

## 2023-01-24 DIAGNOSIS — M79675 Pain in left toe(s): Secondary | ICD-10-CM

## 2023-01-24 DIAGNOSIS — M25561 Pain in right knee: Secondary | ICD-10-CM | POA: Insufficient documentation

## 2023-01-24 DIAGNOSIS — S99922A Unspecified injury of left foot, initial encounter: Secondary | ICD-10-CM | POA: Diagnosis not present

## 2023-01-24 DIAGNOSIS — S79912A Unspecified injury of left hip, initial encounter: Secondary | ICD-10-CM | POA: Diagnosis not present

## 2023-01-24 NOTE — Patient Instructions (Addendum)
Xrays today  For right knee - possible bursitis. Continue ibuprofen as needed, ice pack to knee, do exercises provided today.  If not improving with this, schedule appointment with Dr Lorelei Pont our sports medicine doctor for evaluation.

## 2023-01-24 NOTE — Assessment & Plan Note (Signed)
Overall reassuring exam. Check L 4th toe film r/o poorly healing fracture.

## 2023-01-24 NOTE — Assessment & Plan Note (Addendum)
Exam suspicious for ?patellar bursitis vs loose bodies in knee vs other - check xray today.  Discussed ice packs to knee, continue tylenol/ibuprofen PRN. Rec protection to kneecap esp when kneeling. Provided with exercises from [facility] pt advisor.  Rec f/u with sports med if not improving with this. Pt agrees with plan.

## 2023-01-24 NOTE — Progress Notes (Signed)
Patient ID: Derrick Horn, male    DOB: 1961/11/09, 62 y.o.   MRN: [id number]  This visit was conducted in person.  BP 120/76   Pulse (!) 46   Temp (!) 97.2 F (36.2 C) (Temporal)   Ht 5' 8"$  (1.727 m)   Wt 195 lb 2 oz (88.5 kg)   SpO2 96%   BMI 29.67 kg/m    CC: L toe pain  Subjective:   HPI: Derrick Horn is a 62 y.o. male presenting on 01/24/2023 for Toe Pain (C/o L pain. Injured toe 2-3 mos ago. Also, c/o R knee pain. )   2-3 mo h/o L 4th toe pain that started after stubbing toe. Sharp pain to toe. Wearing shoe helps pain.   R anterior knee pain for 3-4 months - just present when kneeling, feels like kneeling on ice pick. No inciting trauma/injury or falls.   Taking tylenol, ibuprofen for discomfort. He does take 2 nexium daily.   Seeing [place] ortho Dr Tonita Cong for lumbar back pain. Undergoing PT. Had lumbar MRI.      Relevant past medical, surgical, family and social history reviewed and updated as indicated. Interim medical history since our last visit reviewed. Allergies and medications reviewed and updated. Outpatient Medications Prior to Visit  Medication Sig Dispense Refill   aspirin EC 81 MG tablet Take 1 tablet (81 mg total) by mouth daily.     Cholecalciferol (VITAMIN D3) 1000 units CAPS Take 1 capsule by mouth daily.     Coenzyme Q10 (CO Q 10 PO) Take 2 tablets by mouth 3 (three) times a week.     esomeprazole (NEXIUM) 20 MG capsule Take 2 capsules (40 mg total) by mouth daily at 12 noon.     Multiple Vitamin (MULTIVITAMIN ADULT) TABS Take 1 tablet by mouth daily.     polycarbophil (FIBERCON) 625 MG tablet Take 625 mg by mouth daily.     rosuvastatin (CRESTOR) 10 MG tablet TAKE 1 TABLET BY MOUTH 3 TIMES A WEEK. 30 tablet 0   vitamin B-12 (CYANOCOBALAMIN) 500 MCG tablet Take 1 tablet (500 mcg total) by mouth daily.     doxycycline (VIBRA-TABS) 100 MG tablet Take 1 tablet (100 mg total) by mouth 2 (two) times daily. 20 tablet 0   fluticasone (FLONASE) 50 MCG/ACT  nasal spray Place 2 sprays into both nostrils daily. 16 g 0   No facility-administered medications prior to visit.     Per HPI unless specifically indicated in ROS section below Review of Systems  Objective:  BP 120/76   Pulse (!) 46   Temp (!) 97.2 F (36.2 C) (Temporal)   Ht 5' 8"$  (1.727 m)   Wt 195 lb 2 oz (88.5 kg)   SpO2 96%   BMI 29.67 kg/m   Wt Readings from Last 3 Encounters:  01/24/23 195 lb 2 oz (88.5 kg)  01/17/23 198 lb 3.2 oz (89.9 kg)  09/12/22 187 lb 8 oz (85 kg)      Physical Exam Vitals and nursing note reviewed.  Constitutional:      Appearance: Normal appearance. He is not ill-appearing.  Musculoskeletal:        General: Tenderness present. No swelling or deformity. Normal range of motion.     Right lower leg: No edema.     Left lower leg: No edema.     Comments:  R knee exam: No deformity on inspection. No pain with palpation of knee landmarks. No effusion/swelling noted. FROM in flex/extension  without crepitus, popping at patella with full knee extension. No popliteal fullness. Neg drawer test. Neg mcmurray test. No pain with valgus/varus stress. + PFgrind. No abnormal patellar mobility.   L foot - no erythema or swelling noted, mild discomfort to palpation of 4th distal IP joint of toe without deformity. No pain with axial loading. No MT discomfort throughout foot.   Skin:    General: Skin is warm and dry.     Findings: No rash.  Neurological:     Mental Status: He is alert.     Comments: Sensation intact  Psychiatric:        Mood and Affect: Mood normal.        Behavior: Behavior normal.        Assessment & Plan:   Problem List Items Addressed This Visit     Anterior knee pain, right - Primary    Exam suspicious for ?patellar bursitis vs loose bodies in knee vs other - check xray today.  Discussed ice packs to knee, continue tylenol/ibuprofen PRN. Rec protection to kneecap esp when kneeling. Provided with exercises from [facility] pt  advisor.  Rec f/u with sports med if not improving with this. Pt agrees with plan.       Pain in toe of left foot    Overall reassuring exam. Check L 4th toe film r/o poorly healing fracture.       Relevant Orders   DG Toe 4th Left     No orders of the defined types were placed in this encounter.   Orders Placed This Encounter  Procedures   DG Toe 4th Left    Standing Status:   Future    Number of Occurrences:   1    Standing Expiration Date:   01/25/2024    Order Specific Question:   Reason for Exam (SYMPTOM  OR DIAGNOSIS REQUIRED)    Answer:   persistent L toe pain after trauma 2-3 months ago    Order Specific Question:   Preferred imaging location?    Answer:   [facility]    Patient Instructions  Xrays today  For right knee - possible bursitis. Continue ibuprofen as needed, ice pack to knee, do exercises provided today.  If not improving with this, schedule appointment with Dr Lorelei Pont our sports medicine doctor for evaluation.   Follow up plan: Return if symptoms worsen or fail to improve.  Ria Bush, MD

## 2023-03-25 ENCOUNTER — Other Ambulatory Visit: Payer: Self-pay | Admitting: Cardiovascular Disease

## 2023-04-03 ENCOUNTER — Ambulatory Visit: Payer: Managed Care, Other (non HMO) | Admitting: Neurology

## 2023-04-03 ENCOUNTER — Encounter: Payer: Self-pay | Admitting: Neurology

## 2023-04-03 VITALS — BP 115/69 | HR 45 | Ht 69.0 in | Wt 188.8 lb

## 2023-04-03 DIAGNOSIS — R9082 White matter disease, unspecified: Secondary | ICD-10-CM

## 2023-04-03 DIAGNOSIS — M4156 Other secondary scoliosis, lumbar region: Secondary | ICD-10-CM

## 2023-04-03 DIAGNOSIS — J432 Centrilobular emphysema: Secondary | ICD-10-CM

## 2023-04-03 DIAGNOSIS — Z9989 Dependence on other enabling machines and devices: Secondary | ICD-10-CM | POA: Diagnosis not present

## 2023-04-03 NOTE — Progress Notes (Signed)
Provider:  Melvyn Novas, MD  Primary Care Physician:  Eustaquio Boyden, MD [address] [place] [address]     Referring Provider: Lucia Gaskins, MD        Chief Complaint according to patient   Patient presents with:     New Patient (Initial Visit)     Patient in room #2 and alone. Patient states he well and stable, no new concerns.  Patient is in need a new supplies for his CPAP machine. CPAP was issued 2022.       HISTORY OF PRESENT ILLNESS:   here for CPAP follow up- Derrick Horn is a 62 y.o. male patient who is here for revisit 04/03/2023 for  CPAP compliance.  He is considered CPAP dependent. .  Derrick Horn is seen here today with a recent download of CPAP data and he has been using his machine 30 out of 30 days and 29 of those days consecutively for over 4 hours.  The average time of use is 6 hours 40 minutes each night.  He uses a outer titration system between 5 and 15 cmH2O with 2 cm expiratory relief.  95th percentile pressure is 8.9 cm water 95th percentile leak is 13.3 L a minute, residual AHI is 4.2/h.  The patient reports that he has abundant energy since he is using CPAP the only problem that affects his sleep is his latent back pain which is related to scoliosis  Epworth Sleepiness Scale / ESS was endorsed at 2 points out of 24 ,  the FSS/ fatigue severity at 9 out of 30 at 10 points out of 63.  He does not endorse depression by GDS.      Derrick Horn is a 62 y.o. male patient, and today he is referred by dr Sharen Hones d for a memory evaluation- 09-10-2021. His primary neurologist is Dr Lucia Gaskins . He reports memory loss, amnestic. Confusion. Last MOCA in 2019 / 2020 was only 23/ 30 !    He reports memory loss and has done so before COVID infection in April 2022, since then worsened. Had used prevagen but felt not sure it done anything. Uses CPAP with a nasal cradle.  The patient's primary care physician actually just asked for an annual  exam and this was in relation to sleep apnea on continuation of the CPAP machine use.  The patient was referred at the same time to Dr. Leone Payor, the [facility].  The encounter date was 24 June 2021 and Dr. Sharen Hones also ordered Prevagen Nexium and multivitamins for him.  The patient continues to use his CPAP compliantly at 97% 29 out of 30 days, with an average use at time of 7 hours and 5 minutes.  AutoSet is between 5 cm water pressure and 15 cm water pressure with an EPR of 2 cm.  Residual AHI is 2.5/h, air leaks are very low 7.2 L/min is on the low side, he has some central apneas that arise but the residual apnea index is 2.5 and 2 low to warrant any kind of weak titration.  The 95th percentile pressure need is at 9 cm water pressure well within his current settings.  Epworth today 3/ 24            He had been seen here on 11/28/2018 in a referral  from Dr. Lucia Gaskins for a sleep evaluation. Underwent HST and was diagnosed with sleep apnea, and treated on auto CPAP-  Memory loss   Interval history: Dr Lucia Gaskins, MD -Patient saw me in 11/2019 for memory problems. I sent him to sleep studies and, if it was positive, asked him to use cpap for 6 months and return to re-evaluate memory. He was diagnosed in 12/2019 with Moderate- Severe sleep apnea at AHI 27.5/h, OSA without REM accentuation and without hypoxemia. Moderate snoring. Sinus Bradycardia. He has been compliant but patient never followed back up with me for a memory recheck. MRI of the brain in 11/2018 was unremarkable. He was diagnosed with B12 deficiency(196 and 226 in the past) but most recent 06/2021 was normal. TSH in 06/2020 was normal.    Still having memory problems. More short term memory. Wife provides information. He can remember something that happened in the third grade but not a name recently. He performs all his ADLs and IADLs, he is also under a lot of stress. His wife reports he goes to tell her something and he can't think of a word and  he says that "thing", he repeats things and goes off on a tangent and can't find his words. It is aggravating. He feels stupid. He feels like it is progressive. After covid he thinks he became worse. Needs to keep lists or forgets things. Forgets customers names. He can compensate but still for years feels his short term Derrick Horn is impaired. He smoked a lot weed from [age].  He is neither excessively sleepy, not fatigued, but reports vertigo and short term memory problems. Had normal MRI with Dr. Lucia Gaskins, but abnormal MOCA. 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 Travels a lot, also through time zones. He is a" low grade smoker of 5-6 cig a day ". He is snoring so loudly that is spouse has left the bedroom.  Had a shoulder surgery hindered him to sleep on his right, he was more restless.He is status post gastric banding.    Chief complaint according to patient :He is neither excessively sleepy, not fatigued, but reports vertigo and short term memory problems. He hopes that sleep disturbances may be the cause and can be treated.       Review of Systems: Out of a complete 14 system review, the patient complains of only the following symptoms, and all other reviewed systems are negative.:  Fatigue, sleepiness , snoring,   no  longer fragmented sleep, no Insomnia, no RLS, no Nocturia    How likely are you to doze in the following situations: 0 = not likely, 1 = slight chance, 2 = moderate chance, 3 = high chance   Sitting and Reading? Watching Television? Sitting inactive in a public place (theater or meeting)? As a passenger in a car for an hour without a break? Lying down in the afternoon when circumstances permit? Sitting and talking to someone? Sitting quietly after lunch without alcohol? In a car, while stopped for a few minutes in traffic?   TEpworth Sleepiness Scale / ESS was endorsed at 2 points out of 24 ,  the FSS/ fatigue severity at  9 out of 30 at 10 points out of 63.  He does not endorse depression by GDS.  Social History   Socioeconomic History   Marital status: Married    Spouse name: Not on file   Number of children: 1   Years of education: Not on file   Highest education level: Not on file  Occupational History   Occupation: Sales  Tobacco Use  Smoking status: Former    Packs/day: 0.25    Years: 30.00    Additional pack years: 0.00    Total pack years: 7.50    Types: Cigarettes    Quit date: 11/15/2018    Years since quitting: 4.3   Smokeless tobacco: Never  Vaping Use   Vaping Use: Never used  Substance and Sexual Activity   Alcohol use: Never   Drug use: Never   Sexual activity: Yes  Other Topics Concern   Not on file  Social History Narrative   Caffeine: 1 cup coffee/day   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   1 dog   Activity: shooting, yardwork, no regular exercise   Diet: more salads, more oatmeal, good water   Social Determinants of Health   Financial Resource Strain: Not on file  Food Insecurity: Not on file  Transportation Needs: Not on file  Physical Activity: Not on file  Stress: Not on file  Social Connections: Not on file    Family History  Problem Relation Age of Onset   Coronary artery disease Father 63       s/p stents, CABG   Prostate cancer Father 49       doing well   Cancer Maternal Grandmother        uterine   Coronary artery disease Maternal Grandfather 35   Stroke Maternal Grandfather    Diabetes Neg Hx    Colon cancer Neg Hx    Colon polyps Neg Hx    Esophageal cancer Neg Hx    Rectal cancer Neg Hx    Stomach cancer Neg Hx    Memory loss Neg Hx    Dementia Neg Hx     Past Medical History:  Diagnosis Date   Chronic LBP    COPD (chronic obstructive pulmonary disease)    minimal emphysema on CT scan   GERD (gastroesophageal reflux disease)    mild   Headache    silent migraine attack  age 8's   History of chest pain 2010   stress test WNL   HLD (hyperlipidemia)    mild   Recurrent sinus infections    Sensorineural hearing loss (SNHL) of both ears 12/2015   mild-mod, no need for aides, rec melatoonin and lipoflavonoid [facility])   Sleep apnea 2020   CPap   Tear of left biceps muscle 01/19/2014    Past Surgical History:  Procedure Laterality Date   BICEPS TENDON REPAIR  2015   [name]   CARPAL TUNNEL RELEASE     right   chest CT  2011   no pulm nodules, mild apical scarring/emphysematous changes   COLONOSCOPY  09/2010   2 hyperplastic polyps ([facility])   COLONOSCOPY WITH PROPOFOL  09/15/2021   TAs, SSP, diverticulosis, hemorrhoids, rpt 3 yrs ([facility])   ETT  2010   WNL (Dr. Eden Emms)   GREEN LIGHT LASER TURP (TRANSURETHRAL RESECTION OF PROSTATE N/A 05/30/2016   Procedure: GREEN LIGHT LASER TURP (TRANSURETHRAL RESECTION OF PROSTATE;  Surgeon: Orson Ape, MD;  Location: [facility] ORS;  Service: Urology;  Laterality: N/A;   SHOULDER ARTHROSCOPY WITH SUBACROMIAL DECOMPRESSION, ROTATOR CUFF REPAIR AND BICEP TENDON REPAIR  04/2018   Dr Rennis Chris     Current Outpatient Medications on File Prior to Visit  Medication Sig Dispense Refill   aspirin EC 81 MG tablet Take 1 tablet (81 mg total) by mouth daily.     Cholecalciferol (VITAMIN  D3) 1000 units CAPS Take 1 capsule by mouth daily.     Coenzyme Q10 (CO Q 10 PO) Take 2 tablets by mouth 3 (three) times a week.     esomeprazole (NEXIUM) 20 MG capsule Take 2 capsules (40 mg total) by mouth daily at 12 noon.     Multiple Vitamin (MULTIVITAMIN ADULT) TABS Take 1 tablet by mouth daily.     polycarbophil (FIBERCON) 625 MG tablet Take 625 mg by mouth daily.     rosuvastatin (CRESTOR) 10 MG tablet TAKE 1 TABLET BY MOUTH THREE TIMES A WEEK 36 tablet 3   vitamin B-12 (CYANOCOBALAMIN) 500 MCG tablet Take 1 tablet (500 mcg total) by mouth daily.     No current facility-administered medications on file prior to visit.     Allergies  Allergen Reactions   Azithromycin Other (See Comments)    bradycardia, hypotension,syncope, 3 days in hospital   Pyridium [Phenazopyridine Hcl]     Nausea/vomiting     DIAGNOSTIC DATA (LABS, IMAGING, TESTING) - I reviewed patient records, labs, notes, testing and imaging myself where available.  Lab Results  Component Value Date   WBC 4.9 02/02/2022   HGB 16.5 02/02/2022   HCT 49.7 02/02/2022   MCV 91 02/02/2022   PLT 176 02/02/2022      Component Value Date/Time   NA 139 09/05/2022 0749   NA 142 02/02/2022 0822   K 4.2 09/05/2022 0749   CL 105 09/05/2022 0749   CO2 29 09/05/2022 0749   GLUCOSE 120 (H) 09/05/2022 0749   BUN 14 09/05/2022 0749   BUN 16 02/02/2022 0822   CREATININE 0.99 09/05/2022 0749   CREATININE 1.26 06/03/2011 0000   CALCIUM 9.2 09/05/2022 0749   PROT 6.7 09/05/2022 0749   PROT 7.2 02/02/2022 0822   ALBUMIN 4.1 09/05/2022 0749   ALBUMIN 4.9 (H) 02/02/2022 0822   AST 14 09/05/2022 0749   AST 25 06/03/2011 0000   ALT 17 09/05/2022 0749   ALKPHOS 71 09/05/2022 0749   ALKPHOS 101 06/03/2011 0000   BILITOT 0.8 09/05/2022 0749   BILITOT 0.6 02/02/2022 0822   GFRNONAA 76 07/30/2020 0731   GFRAA 88 07/30/2020 0731   Lab Results  Component Value Date   CHOL 123 09/05/2022   HDL 48.80 09/05/2022   LDLCALC 58 09/05/2022   LDLDIRECT 137 06/03/2011   TRIG 83.0 09/05/2022   CHOLHDL 3 09/05/2022   Lab Results  Component Value Date   HGBA1C 5.8 09/05/2022   Lab Results  Component Value Date   VITAMINB12 790 09/05/2022   Lab Results  Component Value Date   TSH 3.31 06/21/2020    PHYSICAL EXAM:  Today's Vitals   04/03/23 0809  BP: 115/69  Pulse: (!) 45  Weight: 188 lb 12.8 oz (85.6 kg)  Height: 5\' 9"  (1.753 m)   Body mass index is 27.88 kg/m.   Wt Readings from Last 3 Encounters:  04/03/23 188 lb 12.8 oz (85.6 kg)  01/24/23 195 lb 2 oz (88.5 kg)  01/17/23 198 lb 3.2 oz (89.9 kg)     Ht Readings from Last 3  Encounters:  04/03/23 5\' 9"  (1.753 m)  01/24/23 5\' 8"  (1.727 m)  01/17/23 5\' 8"  (1.727 m)      General:The patient is awake, alert and appears not in acute distress. The patient is well groomed. Head: Normocephalic, atraumatic. Neck is supple. Mallampati 4 with a thick uvula - low palate. ,  neck circumference: 16" . Nasal airflow congestion, Retrognathia is not  seen.  Cardiovascular:  Regular rate and rhythm, without  murmurs or carotid bruit, and without distended neck veins. Respiratory: Lungs are clear to auscultation. Skin:  Without evidence of edema, or rash Trunk: BMI is 28.2 . The patient's posture is erect.    Neurologic exam : The patient is awake and alert, oriented to place and time.   Memory testing revealed :  This patient was scheduled for sleep clinic /OSA follow up and not for a full Memory evaluation.   MOCA: Montreal Cognitive Assessment  11/26/2018  Visuospatial/ Executive (0/5) 5  Naming (0/3) 3  Attention: Read list of digits (0/2) 2  Attention: Read list of letters (0/1) 1  Attention: Serial 7 subtraction starting at 100 (0/3) 3  Language: Repeat phrase (0/2) 1  Language : Fluency (0/1) 0  Abstraction (0/2) 2  Delayed Recall (0/5) 1  Orientation (0/6) 5  Total 23    MMSE:No flowsheet data found.       Attention span & concentration ability appears limited. Very convoluted history, difficulties with keeping reported interval history in a time frame, reference to what plans which specialist has discussed with him.  Speech is fluent,  without dysarthria, dysphonia or aphasia.  Mood and affect are appropriate.   Cranial nerves: Pupils are equal and briskly reactive to light. Funduscopic exam deferred- Extraocular movements  in vertical and horizontal planes intact and without nystagmus. Visual fields by finger perimetry are intact. Hearing impaired- reports tinnitus.   Facial sensation intact to fine touch.  Facial motor strength is symmetric and tongue  and uvula move midline. Shoulder shrug was symmetrical.    Motor exam:   Normal tone, muscle bulk and symmetric strength in all extremities.   Sensory:  Had some trouble with right shoulder, fingers will get numb, but not lasting.  Fine touch, pinprick and vibration were tested in all extremities. Proprioception tested in the upper extremities was normal.   Coordination: Rapid alternating movements in the fingers/hands are normal. No change in penmanship. Finger-to-nose maneuver  normal without evidence of ataxia, dysmetria or tremor. Gait and station: Patient walks without assistive device. He has normal balance.  Deep tendon reflexes: in the upper and lower extremities are symmetric and intact.     ASSESSMENT AND PLAN 62 y.o. year old male a patient of Dr Trevor Mace here with:    1) Patient with high compliance on CPAP for OSA, satisfactory result by AHI reduction and also by ESS and FSS -   2) DME needs a prescription- [facility] , now [employer]. He is [employer] coverage is likely insisting on [facility]   3)  continue using your CPAP !    I plan to follow up either personally or through our NP within 12 months.    Eustaquio Boyden, Md [address] [place],  [place] [address], and Dr Jillyn Hidden . CC: I will share my notes with Dr Lucia Gaskins .  After spending a total time of  23  minutes face to face and additional time for physical and neurologic examination, review of laboratory studies,  personal review of imaging studies, reports and results of other testing and review of referral information / records as far as provided in visit,   Electronically signed by: Melvyn Novas, MD 04/03/2023 8:50 AM  [facility] Board certified by The [employer] of Sleep Medicine and Diplomate of the [employer] of Sleep Medicine. Board certified In Neurology through the ABPN, Fellow of the [employer] of  Neurology. Medical Director of [employer].

## 2023-04-03 NOTE — Patient Instructions (Signed)
Living With Sleep Apnea Sleep apnea is a condition in which breathing pauses or becomes shallow during sleep. Sleep apnea is most commonly caused by a collapsed or blocked airway. People with sleep apnea usually snore loudly. They may have times when they gasp and stop breathing for 10 seconds or more during sleep. This may happen many times during the night. The breaks in breathing also interrupt the deep sleep that you need to feel rested. Even if you do not completely wake up from the gaps in breathing, your sleep may not be restful and you feel tired during the day. You may also have a headache in the morning and low energy during the day, and you may feel anxious or depressed. How can sleep apnea affect me? Sleep apnea increases your chances of extreme tiredness during the day (daytime fatigue). It can also increase your risk for health conditions, such as: Heart attack. Stroke. Obesity. Type 2 diabetes. Heart failure. Irregular heartbeat. High blood pressure. If you have daytime fatigue as a result of sleep apnea, you may be more likely to: Perform poorly at school or work. Fall asleep while driving. Have difficulty with attention. Develop depression or anxiety. Have sexual dysfunction. What actions can I take to manage sleep apnea? Sleep apnea treatment  If you were given a device to open your airway while you sleep, use it only as told by your health care provider. You may be given: An oral appliance. This is a custom-made mouthpiece that shifts your lower jaw forward. A continuous positive airway pressure (CPAP) device. This device blows air through a mask when you breathe out (exhale). A nasal expiratory positive airway pressure (EPAP) device. This device has valves that you put into each nostril. A bi-level positive airway pressure (BIPAP) device. This device blows air through a mask when you breathe in (inhale) and breathe out (exhale). You may need surgery if other treatments  do not work for you. Sleep habits Go to sleep and wake up at the same time every day. This helps set your internal clock (circadian rhythm) for sleeping. If you stay up later than usual, such as on weekends, try to get up in the morning within 2 hours of your normal wake time. Try to get at least 7-9 hours of sleep each night. Stop using a computer, tablet, and mobile phone a few hours before bedtime. Do not take long naps during the day. If you nap, limit it to 30 minutes. Have a relaxing bedtime routine. Reading or listening to music may relax you and help you sleep. Use your bedroom only for sleep. Keep your television and computer out of your bedroom. Keep your bedroom cool, dark, and quiet. Use a supportive mattress and pillows. Follow your health care provider's instructions for other changes to sleep habits. Nutrition Do not eat heavy meals in the evening. Do not have caffeine in the later part of the day. The effects of caffeine can last for more than 5 hours. Follow your health care provider's or dietitian's instructions for any diet changes. Lifestyle     Do not drink alcohol before bedtime. Alcohol can cause you to fall asleep at first, but then it can cause you to wake up in the middle of the night and have trouble getting back to sleep. Do not use any products that contain nicotine or tobacco. These products include cigarettes, chewing tobacco, and vaping devices, such as e-cigarettes. If you need help quitting, ask your health care provider. Medicines Take   over-the-counter and prescription medicines only as told by your health care provider. Do not use over-the-counter sleep medicine. You can become dependent on this medicine, and it can make sleep apnea worse. Do not use medicines, such as sedatives and narcotics, unless told by your health care provider. Activity Exercise on most days, but avoid exercising in the evening. Exercising near bedtime can interfere with  sleeping. If possible, spend time outside every day. Natural light helps regulate your circadian rhythm. General information Lose weight if you need to, and maintain a healthy weight. Keep all follow-up visits. This is important. If you are having surgery, make sure to tell your health care provider that you have sleep apnea. You may need to bring your device with you. Where to find more information Learn more about sleep apnea and daytime fatigue from: American Sleep Association: sleepassociation.org National Sleep Foundation: sleepfoundation.org National Heart, Lung, and Blood Institute: nhlbi.nih.gov Summary Sleep apnea is a condition in which breathing pauses or becomes shallow during sleep. Sleep apnea can cause daytime fatigue and other serious health conditions. You may need to wear a device while sleeping to help keep your airway open. If you are having surgery, make sure to tell your health care provider that you have sleep apnea. You may need to bring your device with you. Making changes to sleep habits, diet, lifestyle, and activity can help you manage sleep apnea. This information is not intended to replace advice given to you by your health care provider. Make sure you discuss any questions you have with your health care provider. Document Revised: 07/06/2021 Document Reviewed: 11/05/2020 Elsevier Patient Education  2023 Elsevier Inc. CPAP and BIPAP Information CPAP and BIPAP are methods that use air pressure to keep your airways open and to help you breathe well. CPAP and BIPAP use different amounts of pressure. Your health care provider will tell you whether CPAP or BIPAP would be more helpful for you. CPAP stands for "continuous positive airway pressure." With CPAP, the amount of pressure stays the same while you breathe in (inhale) and out (exhale). BIPAP stands for "bi-level positive airway pressure." With BIPAP, the amount of pressure will be higher when you inhale and  lower when you exhale. This allows you to take larger breaths. CPAP or BIPAP may be used in the hospital, or your health care provider may want you to use it at home. You may need to have a sleep study before your health care provider can order a machine for you to use at home. What are the advantages? CPAP or BIPAP can be helpful if you have: Sleep apnea. Chronic obstructive pulmonary disease (COPD). Heart failure. Medical conditions that cause muscle weakness, including muscular dystrophy or amyotrophic lateral sclerosis (ALS). Other problems that cause breathing to be shallow, weak, abnormal, or difficult. CPAP and BIPAP are most commonly used for obstructive sleep apnea (OSA) to keep the airways from collapsing when the muscles relax during sleep. What are the risks? Generally, this is a safe treatment. However, problems may occur, including: Irritated skin or skin sores if the mask does not fit properly. Dry or stuffy nose or nosebleeds. Dry mouth. Feeling gassy or bloated. Sinus or lung infection if the equipment is not cleaned properly. When should CPAP or BIPAP be used? In most cases, the mask only needs to be worn during sleep. Generally, the mask needs to be worn throughout the night and during any daytime naps. People with certain medical conditions may also need to wear the mask   at other times, such as when they are awake. Follow instructions from your health care provider about when to use the machine. What happens during CPAP or BIPAP?  Both CPAP and BIPAP are provided by a small machine with a flexible plastic tube that attaches to a plastic mask that you wear. Air is blown through the mask into your nose or mouth. The amount of pressure that is used to blow the air can be adjusted on the machine. Your health care provider will set the pressure setting and help you find the best mask for you. Tips for using the mask Because the mask needs to be snug, some people feel trapped or  closed-in (claustrophobic) when first using the mask. If you feel this way, you may need to get used to the mask. One way to do this is to hold the mask loosely over your nose or mouth and then gradually apply the mask more snugly. You can also gradually increase the amount of time that you use the mask. Masks are available in various types and sizes. If your mask does not fit well, talk with your health care provider about getting a different one. Some common types of masks include: Full face masks, which fit over the mouth and nose. Nasal masks, which fit over the nose. Nasal pillow or prong masks, which fit into the nostrils. If you are using a mask that fits over your nose and you tend to breathe through your mouth, a chin strap may be applied to help keep your mouth closed. Use a skin barrier to protect your skin as told by your health care provider. Some CPAP and BIPAP machines have alarms that may sound if the mask comes off or develops a leak. If you have trouble with the mask, it is very important that you talk with your health care provider about finding a way to make the mask easier to tolerate. Do not stop using the mask. There could be a negative impact on your health if you stop using the mask. Tips for using the machine Place your CPAP or BIPAP machine on a secure table or stand near an electrical outlet. Know where the on/off switch is on the machine. Follow instructions from your health care provider about how to set the pressure on your machine and when you should use it. Do not eat or drink while the CPAP or BIPAP machine is on. Food or fluids could get pushed into your lungs by the pressure of the CPAP or BIPAP. For home use, CPAP and BIPAP machines can be rented or purchased through home health care companies. Many different brands of machines are available. Renting a machine before purchasing may help you find out which particular machine works well for you. Your health insurance  company may also decide which machine you may get. Keep the CPAP or BIPAP machine and attachments clean. Ask your health care provider for specific instructions. Check the humidifier if you have a dry stuffy nose or nosebleeds. Make sure it is working correctly. Follow these instructions at home: Take over-the-counter and prescription medicines only as told by your health care provider. Ask if you can take sinus medicine if your sinuses are blocked. Do not use any products that contain nicotine or tobacco. These products include cigarettes, chewing tobacco, and vaping devices, such as e-cigarettes. If you need help quitting, ask your health care provider. Keep all follow-up visits. This is important. Contact a health care provider if: You have redness   or pressure sores on your head, face, mouth, or nose from the mask or head gear. You have trouble using the CPAP or BIPAP machine. You cannot tolerate wearing the CPAP or BIPAP mask. Someone tells you that you snore even when wearing your CPAP or BIPAP. Get help right away if: You have trouble breathing. You feel confused. Summary CPAP and BIPAP are methods that use air pressure to keep your airways open and to help you breathe well. If you have trouble with the mask, it is very important that you talk with your health care provider about finding a way to make the mask easier to tolerate. Do not stop using the mask. There could be a negative impact to your health if you stop using the mask. Follow instructions from your health care provider about when to use the machine. This information is not intended to replace advice given to you by your health care provider. Make sure you discuss any questions you have with your health care provider. Document Revised: 07/06/2021 Document Reviewed: 11/05/2020 Elsevier Patient Education  2023 Elsevier Inc.  

## 2023-04-03 NOTE — Addendum Note (Signed)
Addended by: Judi Cong on: 04/03/2023 02:56 PM   Modules accepted: Orders

## 2023-04-09 ENCOUNTER — Telehealth: Payer: Managed Care, Other (non HMO) | Admitting: Nurse Practitioner

## 2023-04-09 DIAGNOSIS — B9789 Other viral agents as the cause of diseases classified elsewhere: Secondary | ICD-10-CM

## 2023-04-09 DIAGNOSIS — J329 Chronic sinusitis, unspecified: Secondary | ICD-10-CM | POA: Diagnosis not present

## 2023-04-09 MED ORDER — IPRATROPIUM BROMIDE 0.03 % NA SOLN
2.0000 | Freq: Two times a day (BID) | NASAL | 12 refills | Status: DC
Start: 2023-04-09 — End: 2023-08-15

## 2023-04-09 NOTE — Progress Notes (Signed)
E-Visit for Sinus Problems  We are sorry that you are not feeling well.  Here is how we plan to help!  Based on what you have shared with me it looks like you have sinusitis.  Sinusitis is inflammation and infection in the sinus cavities of the head.  Based on your presentation I believe you most likely have Acute Viral Sinusitis.This is an infection most likely caused by a virus. There is not specific treatment for viral sinusitis other than to help you with the symptoms until the infection runs its course.  You may use an oral decongestant such as Mucinex D or if you have glaucoma or high blood pressure use plain Mucinex. Saline nasal spray help and can safely be used as often as needed for congestion, I have prescribed: Ipratropium Bromide nasal spray 0.03% 2 sprays in eah nostril 2-3 times a day  We typically do not recommend initiating antibiotics prior to 7-10 days of symptoms   Providers prescribe antibiotics to treat infections caused by bacteria. Antibiotics are very powerful in treating bacterial infections when they are used properly. To maintain their effectiveness, they should be used only when necessary. Overuse of antibiotics has resulted in the development of superbugs that are resistant to treatment!    After careful review of your answers, I would not recommend an antibiotic for your condition.  Antibiotics are not effective against viruses and therefore should not be used to treat them. Common examples of infections caused by viruses include colds and flu   Some authorities believe that zinc sprays or the use of Echinacea may shorten the course of your symptoms.  Sinus infections are not as easily transmitted as other respiratory infection, however we still recommend that you avoid close contact with loved ones, especially the very young and elderly.  Remember to wash your hands thoroughly throughout the day as this is the number one way to prevent the spread of infection!  Home  Care: Only take medications as instructed by your medical team. Do not take these medications with alcohol. A steam or ultrasonic humidifier can help congestion.  You can place a towel over your head and breathe in the steam from hot water coming from a faucet. Avoid close contacts especially the very young and the elderly. Cover your mouth when you cough or sneeze. Always remember to wash your hands.  Get Help Right Away If: You develop worsening fever or sinus pain. You develop a severe head ache or visual changes. Your symptoms persist after you have completed your treatment plan.  Make sure you Understand these instructions. Will watch your condition. Will get help right away if you are not doing well or get worse.   Thank you for choosing an e-visit.  Your e-visit answers were reviewed by a board certified advanced clinical practitioner to complete your personal care plan. Depending upon the condition, your plan could have included both over the counter or prescription medications.  Please review your pharmacy choice. Make sure the pharmacy is open so you can pick up prescription now. If there is a problem, you may contact your provider through [employer] and have the prescription routed to another pharmacy.  Your safety is important to [place]. If you have drug allergies check your prescription carefully.   For the next 24 hours you can use MyChart to ask questions about today's visit, request a non-urgent call back, or ask for a work or school excuse. You will get an email in the next two  days asking about your experience. I hope that your e-visit has been valuable and will speed your recovery.  Meds ordered this encounter  Medications   ipratropium (ATROVENT) 0.03 % nasal spray    Sig: Place 2 sprays into both nostrils every 12 (twelve) hours.    Dispense:  30 mL    Refill:  12    I spent approximately 5 minutes reviewing the patient's history, current symptoms and  coordinating their care today.

## 2023-04-11 ENCOUNTER — Telehealth: Payer: Managed Care, Other (non HMO) | Admitting: Family Medicine

## 2023-04-11 DIAGNOSIS — J019 Acute sinusitis, unspecified: Secondary | ICD-10-CM

## 2023-04-11 DIAGNOSIS — B9689 Other specified bacterial agents as the cause of diseases classified elsewhere: Secondary | ICD-10-CM

## 2023-04-11 MED ORDER — AMOXICILLIN-POT CLAVULANATE 875-125 MG PO TABS
1.0000 | ORAL_TABLET | Freq: Two times a day (BID) | ORAL | 0 refills | Status: AC
Start: 2023-04-11 — End: 2023-04-18

## 2023-04-11 MED ORDER — PSEUDOEPH-BROMPHEN-DM 30-2-10 MG/5ML PO SYRP
5.0000 mL | ORAL_SOLUTION | Freq: Four times a day (QID) | ORAL | 0 refills | Status: DC | PRN
Start: 2023-04-11 — End: 2023-08-15

## 2023-04-11 MED ORDER — BENZONATATE 100 MG PO CAPS
100.0000 mg | ORAL_CAPSULE | Freq: Two times a day (BID) | ORAL | 0 refills | Status: DC | PRN
Start: 2023-04-11 — End: 2023-08-15

## 2023-04-11 NOTE — Progress Notes (Signed)
Virtual Visit Consent   Derrick Horn, you are scheduled for a virtual visit with a [facility] provider today. Just as with appointments in the office, your consent must be obtained to participate. Your consent will be active for this visit and any virtual visit you may have with one of our providers in the next 365 days. If you have a MyChart account, a copy of this consent can be sent to you electronically.  As this is a virtual visit, video technology does not allow for your provider to perform a traditional examination. This may limit your provider's ability to fully assess your condition. If your provider identifies any concerns that need to be evaluated in person or the need to arrange testing (such as labs, EKG, etc.), we will make arrangements to do so. Although advances in technology are sophisticated, we cannot ensure that it will always work on either your end or our end. If the connection with a video visit is poor, the visit may have to be switched to a telephone visit. With either a video or telephone visit, we are not always able to ensure that we have a secure connection.  By engaging in this virtual visit, you consent to the provision of healthcare and authorize for your insurance to be billed (if applicable) for the services provided during this visit. Depending on your insurance coverage, you may receive a charge related to this service.  I need to obtain your verbal consent now. Are you willing to proceed with your visit today? [name] has provided verbal consent on 04/11/2023 for a virtual visit (video or telephone). Freddy Finner, NP  Date: 04/11/2023 11:15 AM  Virtual Visit via Video Note   I, Freddy Finner, connected with  Derrick Horn  ([id number], 05-May-1961) on 04/11/23 at 11:15 AM EDT by a video-enabled telemedicine application and verified that I am speaking with the correct person using two identifiers.  Location: Patient: Virtual Visit Location Patient:  Home Provider: Virtual Visit Location Provider: Home Office   I discussed the limitations of evaluation and management by telemedicine and the availability of in person appointments. The patient expressed understanding and agreed to proceed.    History of Present Illness: Derrick Horn is a 62 y.o. who identifies as a male who was assigned male at birth, and is being seen today for sinus infection  Onset was 5 days ago- with sore throat and coughing Associated symptoms are had clear drainage but now it is yellow-green mucus, ear pain on left side- tenderness, hard time sleeping due to CPAP use. Fever of 99-100.2 Modifying factors are ipratropium nasal spray, saline, vaporcool, promethazine cough syrup of his wife's she had left over Denies chest pain, shortness of breath,  chills  Exposure to sick contacts- known COVID test: no  Problems:  Patient Active Problem List   Diagnosis Date Noted   Anterior knee pain, right 01/24/2023   Pain in toe of left foot 01/24/2023   Advanced directives, counseling/discussion 09/12/2022   Sinus bradycardia 09/12/2022   Vitamin D deficiency 09/05/2022   Prediabetes 09/05/2022   COPD (chronic obstructive pulmonary disease) (HCC) 10/06/2021   Dependence on CPAP ventilation 09/26/2021   COVID-19 virus infection 04/08/2021   B12 deficiency 06/23/2020   Thoracic ascending aortic aneurysm 06/23/2020   Pedal edema 12/18/2018   OSA on CPAP 11/26/2018   Cerebrovascular small vessel disease 11/21/2018   Memory loss 11/18/2018   Tinnitus of both ears 11/18/2018   Vertigo 11/18/2018  Rotator cuff tear arthropathy, right 03/03/2018   BPH (benign prostatic hyperplasia) 03/31/2016   Decreased hearing of both ears 12/29/2015   Dysphagia 12/29/2015   Sensorineural hearing loss (SNHL) of both ears 12/12/2015   Healthcare maintenance 07/14/2011   Dyslipidemia 07/14/2011   LOW BACK PAIN, CHRONIC 08/25/2010   Overweight 08/04/2010   Ex-smoker 08/04/2010    GERD 08/04/2010    Allergies:  Allergies  Allergen Reactions   Azithromycin Other (See Comments)    bradycardia, hypotension,syncope, 3 days in hospital   Pyridium [Phenazopyridine Hcl]     Nausea/vomiting   Medications:  Current Outpatient Medications:    aspirin EC 81 MG tablet, Take 1 tablet (81 mg total) by mouth daily., Disp: , Rfl:    Cholecalciferol (VITAMIN D3) 1000 units CAPS, Take 1 capsule by mouth daily., Disp: , Rfl:    Coenzyme Q10 (CO Q 10 PO), Take 2 tablets by mouth 3 (three) times a week., Disp: , Rfl:    esomeprazole (NEXIUM) 20 MG capsule, Take 2 capsules (40 mg total) by mouth daily at 12 noon., Disp: , Rfl:    ipratropium (ATROVENT) 0.03 % nasal spray, Place 2 sprays into both nostrils every 12 (twelve) hours., Disp: 30 mL, Rfl: 12   Multiple Vitamin (MULTIVITAMIN ADULT) TABS, Take 1 tablet by mouth daily., Disp: , Rfl:    polycarbophil (FIBERCON) 625 MG tablet, Take 625 mg by mouth daily., Disp: , Rfl:    rosuvastatin (CRESTOR) 10 MG tablet, TAKE 1 TABLET BY MOUTH THREE TIMES A WEEK, Disp: 36 tablet, Rfl: 3   vitamin B-12 (CYANOCOBALAMIN) 500 MCG tablet, Take 1 tablet (500 mcg total) by mouth daily., Disp: , Rfl:   Observations/Objective: Patient is well-developed, well-nourished in no acute distress.  Resting comfortably  at home.  Head is normocephalic, atraumatic.  No labored breathing.  Speech is clear and coherent with logical content.  Patient is alert and oriented at baseline.    Assessment and Plan:  1. Acute bacterial sinusitis  - amoxicillin-clavulanate (AUGMENTIN) 875-125 MG tablet; Take 1 tablet by mouth 2 (two) times daily for 7 days.  Dispense: 14 tablet; Refill: 0 - benzonatate (TESSALON) 100 MG capsule; Take 1 capsule (100 mg total) by mouth 2 (two) times daily as needed for cough.  Dispense: 20 capsule; Refill: 0 - brompheniramine-pseudoephedrine-DM 30-2-10 MG/5ML syrup; Take 5 mLs by mouth 4 (four) times daily as needed.  Dispense: 120 mL;  Refill: 0  -Take meds as prescribed -Rest -Use a cool mist humidifier especially during the winter months when heat dries out the air. - Use saline nose sprays frequently to help soothe nasal passages and promote drainage. -Saline irrigations of the nose can be very helpful if done frequently.             * 4X daily for 1 week*             * Use of a nettie pot can be helpful with this.  *Follow directions with this* *Boiled or distilled water only -stay hydrated by drinking plenty of fluids - Keep thermostat turn down low to prevent drying out sinuses - For any cough or congestion- robitussin DM or Delsym as needed - For fever or aches or pains- take tylenol or ibuprofen as directed on bottle             * for fevers greater than 101 orally you may alternate ibuprofen and tylenol every 3 hours.  If you do not improve you will need a follow  up visit in person.               Reviewed side effects, risks and benefits of medication.    Patient acknowledged agreement and understanding of the plan.   Past Medical, Surgical, Social History, Allergies, and Medications have been Reviewed.    Follow Up Instructions: I discussed the assessment and treatment plan with the patient. The patient was provided an opportunity to ask questions and all were answered. The patient agreed with the plan and demonstrated an understanding of the instructions.  A copy of instructions were sent to the patient via MyChart unless otherwise noted below.   The patient was advised to call back or seek an in-person evaluation if the symptoms worsen or if the condition fails to improve as anticipated.  Time:  I spent 10 minutes with the patient via telehealth technology discussing the above problems/concerns.    Freddy Finner, NP

## 2023-04-11 NOTE — Patient Instructions (Signed)
Marga Hoots, thank you for joining Freddy Finner, NP for today's virtual visit.  While this provider is not your primary care provider (PCP), if your PCP is located in our provider database this encounter information will be shared with them immediately following your visit.   A [facility] MyChart account gives you access to today's visit and all your visits, tests, and labs performed at [facility] " click here if you don't have a [facility] MyChart account or go to [email or web address]  Consent: (Patient) Marga Hoots provided verbal consent for this virtual visit at the beginning of the encounter.  Current Medications:  Current Outpatient Medications:    amoxicillin-clavulanate (AUGMENTIN) 875-125 MG tablet, Take 1 tablet by mouth 2 (two) times daily for 7 days., Disp: 14 tablet, Rfl: 0   benzonatate (TESSALON) 100 MG capsule, Take 1 capsule (100 mg total) by mouth 2 (two) times daily as needed for cough., Disp: 20 capsule, Rfl: 0   brompheniramine-pseudoephedrine-DM 30-2-10 MG/5ML syrup, Take 5 mLs by mouth 4 (four) times daily as needed., Disp: 120 mL, Rfl: 0   aspirin EC 81 MG tablet, Take 1 tablet (81 mg total) by mouth daily., Disp: , Rfl:    Cholecalciferol (VITAMIN D3) 1000 units CAPS, Take 1 capsule by mouth daily., Disp: , Rfl:    Coenzyme Q10 (CO Q 10 PO), Take 2 tablets by mouth 3 (three) times a week., Disp: , Rfl:    esomeprazole (NEXIUM) 20 MG capsule, Take 2 capsules (40 mg total) by mouth daily at 12 noon., Disp: , Rfl:    ipratropium (ATROVENT) 0.03 % nasal spray, Place 2 sprays into both nostrils every 12 (twelve) hours., Disp: 30 mL, Rfl: 12   Multiple Vitamin (MULTIVITAMIN ADULT) TABS, Take 1 tablet by mouth daily., Disp: , Rfl:    polycarbophil (FIBERCON) 625 MG tablet, Take 625 mg by mouth daily., Disp: , Rfl:    rosuvastatin (CRESTOR) 10 MG tablet, TAKE 1 TABLET BY MOUTH THREE TIMES A WEEK, Disp: 36 tablet, Rfl: 3   vitamin B-12  (CYANOCOBALAMIN) 500 MCG tablet, Take 1 tablet (500 mcg total) by mouth daily., Disp: , Rfl:    Medications ordered in this encounter:  Meds ordered this encounter  Medications   amoxicillin-clavulanate (AUGMENTIN) 875-125 MG tablet    Sig: Take 1 tablet by mouth 2 (two) times daily for 7 days.    Dispense:  14 tablet    Refill:  0    Order Specific Question:   Supervising Provider    Answer:   Merrilee Jansky [[id number]]   benzonatate (TESSALON) 100 MG capsule    Sig: Take 1 capsule (100 mg total) by mouth 2 (two) times daily as needed for cough.    Dispense:  20 capsule    Refill:  0    Order Specific Question:   Supervising Provider    Answer:   Merrilee Jansky [id number]   brompheniramine-pseudoephedrine-DM 30-2-10 MG/5ML syrup    Sig: Take 5 mLs by mouth 4 (four) times daily as needed.    Dispense:  120 mL    Refill:  0    Order Specific Question:   Supervising Provider    Answer:   Merrilee Jansky [[id number]]     *If you need refills on other medications prior to your next appointment, please contact your pharmacy*  Follow-Up: Call back or seek an in-person evaluation if the symptoms worsen or if the condition fails to improve as anticipated.  [facility] [phone]  Other Instructions  -Take meds as prescribed -Rest -Use a cool mist humidifier especially during the winter months when heat dries out the air. - Use saline nose sprays frequently to help soothe nasal passages and promote drainage. -Saline irrigations of the nose can be very helpful if done frequently.             * 4X daily for 1 week*             * Use of a nettie pot can be helpful with this.  *Follow directions with this* *Boiled or distilled water only -stay hydrated by drinking plenty of fluids - Keep thermostat turn down low to prevent drying out sinuses - For any cough or congestion- robitussin DM or Delsym as needed - For fever or aches or pains- take tylenol or ibuprofen as  directed on bottle             * for fevers greater than 101 orally you may alternate ibuprofen and tylenol every 3 hours.  If you do not improve you will need a follow up visit in person.                  If you have been instructed to have an in-person evaluation today at a local Urgent Care facility, please use the link below. It will take you to a list of all of our available [facility], including address, phone number and hours of operation. Please do not delay care.  [facility]  If you or a family member do not have a primary care provider, use the link below to schedule a visit and establish care. When you choose a [facility] primary care physician or advanced practice provider, you gain a long-term partner in health. Find a Primary Care Provider  Learn more about [facility]'s in-office and virtual care options: [facility] - Get Care Now

## 2023-04-18 ENCOUNTER — Telehealth: Payer: Self-pay | Admitting: Family Medicine

## 2023-08-15 ENCOUNTER — Encounter: Payer: Self-pay | Admitting: Family Medicine

## 2023-08-15 ENCOUNTER — Ambulatory Visit: Payer: Managed Care, Other (non HMO) | Admitting: Family Medicine

## 2023-08-15 VITALS — BP 124/80 | HR 52 | Temp 97.3°F | Ht 69.0 in | Wt 190.4 lb

## 2023-08-15 DIAGNOSIS — N4889 Other specified disorders of penis: Secondary | ICD-10-CM | POA: Diagnosis not present

## 2023-08-15 DIAGNOSIS — R300 Dysuria: Secondary | ICD-10-CM

## 2023-08-15 LAB — POC URINALSYSI DIPSTICK (AUTOMATED)
Bilirubin, UA: NEGATIVE
Blood, UA: NEGATIVE
Glucose, UA: NEGATIVE
Ketones, UA: NEGATIVE
Leukocytes, UA: NEGATIVE
Nitrite, UA: NEGATIVE
Protein, UA: NEGATIVE
Spec Grav, UA: >=1.03 — AB (ref 1.010–1.025)
Urobilinogen, UA: 0.2 U/dL
pH, UA: 5.5 (ref 5.0–8.0)

## 2023-08-15 MED ORDER — DOXYCYCLINE HYCLATE 100 MG PO TABS: 100.0000 mg | ORAL_TABLET | Freq: Two times a day (BID) | ORAL | 0 refills | Status: DC

## 2023-08-15 NOTE — Patient Instructions (Addendum)
Try urinalysis today.  Possibly infected penile cyst that has now mostly drained. Warm compresses to area  Take doxycycline antibiotic 7d course for any residual infection. If not improved with this, let me know for urology referral.

## 2023-08-15 NOTE — Assessment & Plan Note (Signed)
Suspect infected sebaceous cyst to base of ventral penis that has since drained. Supportive measures reviewed - warm compresses, will Rx doxycycline 7d course with photosensitivity precautions.  Update if not resolved with this for urology eval. Pt agrees with plan.

## 2023-08-15 NOTE — Progress Notes (Signed)
Ph: [phone] Fax: [phone]   Patient ID: Derrick Horn, male    DOB: 09/14/1961, 62 y.o.   MRN: [id number]  This visit was conducted in person.  BP 124/80   Pulse (!) 52   Temp (!) 97.3 F (36.3 C) (Temporal)   Ht 5\' 9"  (1.753 m)   Wt 190 lb 6 oz (86.4 kg)   SpO2 99%   BMI 28.11 kg/m    CC: check cyst  Subjective:   HPI: Derrick Horn is a 62 y.o. male presenting on 08/15/2023 for Cyst (C/o cyst on penis. Noticed about 1 mo ago. Was painful and has decreased in size. Area is draining. H/o same sxs. )   1 mo ago noted bump to base of penis near scrotum. Was painful and enlarged to size of fingernail, irritated rubbing against clothes, now decreasing in size. Several weeks ago he did self-incision to cyst with wife's insulin needle. It did drain over 2 weeks with whitish pus.   He does trim private area.  No urethral discharge, fevers/chills, swollen glands.  No new sexual partners.   Chronic dysuria since Burna Mortimer laser TURP 2017 Sheppard Penton), worse with alcohol, managed with azo or cranberry.      Relevant past medical, surgical, family and social history reviewed and updated as indicated. Interim medical history since our last visit reviewed. Allergies and medications reviewed and updated. Outpatient Medications Prior to Visit  Medication Sig Dispense Refill   aspirin EC 81 MG tablet Take 1 tablet (81 mg total) by mouth daily.     Cholecalciferol (VITAMIN D3) 1000 units CAPS Take 1 capsule by mouth daily.     Coenzyme Q10 (CO Q 10 PO) Take 1 tablet by mouth daily.     Cranberry-Vitamin C-Probiotic (AZO CRANBERRY PO) Take by mouth daily.     esomeprazole (NEXIUM) 20 MG capsule Take 2 capsules (40 mg total) by mouth daily at 12 noon.     Multiple Vitamin (MULTIVITAMIN ADULT) TABS Take 1 tablet by mouth daily.     polycarbophil (FIBERCON) 625 MG tablet Take 625 mg by mouth daily.     rosuvastatin (CRESTOR) 10 MG tablet TAKE 1 TABLET BY MOUTH THREE TIMES A WEEK 36  tablet 3   vitamin B-12 (CYANOCOBALAMIN) 500 MCG tablet Take 1 tablet (500 mcg total) by mouth daily.     benzonatate (TESSALON) 100 MG capsule Take 1 capsule (100 mg total) by mouth 2 (two) times daily as needed for cough. 20 capsule 0   brompheniramine-pseudoephedrine-DM 30-2-10 MG/5ML syrup Take 5 mLs by mouth 4 (four) times daily as needed. 120 mL 0   ipratropium (ATROVENT) 0.03 % nasal spray Place 2 sprays into both nostrils every 12 (twelve) hours. 30 mL 12   No facility-administered medications prior to visit.     Per HPI unless specifically indicated in ROS section below Review of Systems  Objective:  BP 124/80   Pulse (!) 52   Temp (!) 97.3 F (36.3 C) (Temporal)   Ht 5\' 9"  (1.753 m)   Wt 190 lb 6 oz (86.4 kg)   SpO2 99%   BMI 28.11 kg/m   Wt Readings from Last 3 Encounters:  08/15/23 190 lb 6 oz (86.4 kg)  04/03/23 188 lb 12.8 oz (85.6 kg)  01/24/23 195 lb 2 oz (88.5 kg)      Physical Exam Vitals and nursing note reviewed.  Constitutional:      Appearance: Normal appearance.  Abdominal:     Hernia:  There is no hernia in the left inguinal area or right inguinal area.  Genitourinary:    Pubic Area: No rash.      Penis: Circumcised. Lesions present. No erythema, tenderness or swelling.      Testes: Normal.     Epididymis:     Right: Normal.     Left: Normal.     Comments: Mildly inflamed bump of skin at base of penis at ventral side near scrotum, able to express small amt pus which was sent for wound culture  Lymphadenopathy:     Lower Body: No right inguinal adenopathy. No left inguinal adenopathy.  Neurological:     Mental Status: He is alert.       Results for orders placed or performed in visit on 08/15/23  POCT Urinalysis Dipstick (Automated)  Result Value Ref Range   Color, UA dark yellow    Clarity, UA clear    Glucose, UA Negative Negative   Bilirubin, UA negative    Ketones, UA negative    Spec Grav, UA >=1.030 (A) 1.010 - 1.025   Blood, UA  negative    pH, UA 5.5 5.0 - 8.0   Protein, UA Negative Negative   Urobilinogen, UA 0.2 0.2 or 1.0 E.U./dL   Nitrite, UA negative    Leukocytes, UA Negative Negative     Assessment & Plan:   Problem List Items Addressed This Visit     Penile cyst - Primary    Suspect infected sebaceous cyst to base of ventral penis that has since drained. Supportive measures reviewed - warm compresses, will Rx doxycycline 7d course with photosensitivity precautions.  Update if not resolved with this for urology eval. Pt agrees with plan.       Relevant Orders   WOUND CULTURE   Dysuria    Notes chronic dysuria since TURP 2016 triggered by alcohol, managed with azo and cranberry. Update UA today.       Relevant Orders   POCT Urinalysis Dipstick (Automated) (Completed)     Meds ordered this encounter  Medications   doxycycline (VIBRA-TABS) 100 MG tablet    Sig: Take 1 tablet (100 mg total) by mouth 2 (two) times daily.    Dispense:  14 tablet    Refill:  0    Orders Placed This Encounter  Procedures   WOUND CULTURE    Order Specific Question:   Source    Answer:   penile cyst   POCT Urinalysis Dipstick (Automated)    Patient Instructions  Try urinalysis today.  Possibly infected penile cyst that has now mostly drained. Warm compresses to area  Take doxycycline antibiotic 7d course for any residual infection. If not improved with this, let me know for urology referral.   Follow up plan: Return if symptoms worsen or fail to improve.  Eustaquio Boyden, MD

## 2023-08-15 NOTE — Assessment & Plan Note (Signed)
Notes chronic dysuria since TURP 2016 triggered by alcohol, managed with azo and cranberry. Update UA today.

## 2023-08-18 LAB — WOUND CULTURE
MICRO NUMBER:: 15420129
SPECIMEN QUALITY:: ADEQUATE

## 2023-09-07 ENCOUNTER — Other Ambulatory Visit: Payer: BC Managed Care – PPO

## 2023-09-10 ENCOUNTER — Ambulatory Visit
Admission: RE | Admit: 2023-09-10 | Discharge: 2023-09-10 | Disposition: A | Discharge status: Home / Self Care | Payer: Managed Care, Other (non HMO) | Source: Ambulatory Visit | Attending: Acute Care | Admitting: Acute Care

## 2023-09-10 DIAGNOSIS — Z87891 Personal history of nicotine dependence: Secondary | ICD-10-CM

## 2023-09-10 DIAGNOSIS — Z122 Encounter for screening for malignant neoplasm of respiratory organs: Secondary | ICD-10-CM

## 2023-09-14 ENCOUNTER — Encounter: Payer: BC Managed Care – PPO | Admitting: Family Medicine

## 2023-09-28 ENCOUNTER — Other Ambulatory Visit: Payer: Self-pay

## 2023-09-28 DIAGNOSIS — Z87891 Personal history of nicotine dependence: Secondary | ICD-10-CM

## 2023-09-28 DIAGNOSIS — Z122 Encounter for screening for malignant neoplasm of respiratory organs: Secondary | ICD-10-CM

## 2023-10-14 ENCOUNTER — Other Ambulatory Visit: Payer: Self-pay | Admitting: Family Medicine

## 2023-10-14 DIAGNOSIS — R7303 Prediabetes: Secondary | ICD-10-CM

## 2023-10-14 DIAGNOSIS — N400 Enlarged prostate without lower urinary tract symptoms: Secondary | ICD-10-CM

## 2023-10-14 DIAGNOSIS — E538 Deficiency of other specified B group vitamins: Secondary | ICD-10-CM

## 2023-10-14 DIAGNOSIS — E559 Vitamin D deficiency, unspecified: Secondary | ICD-10-CM

## 2023-10-14 DIAGNOSIS — E785 Hyperlipidemia, unspecified: Secondary | ICD-10-CM

## 2023-10-16 ENCOUNTER — Other Ambulatory Visit: Payer: Managed Care, Other (non HMO)

## 2023-10-16 DIAGNOSIS — E538 Deficiency of other specified B group vitamins: Secondary | ICD-10-CM

## 2023-10-16 DIAGNOSIS — R7303 Prediabetes: Secondary | ICD-10-CM | POA: Diagnosis not present

## 2023-10-16 DIAGNOSIS — N400 Enlarged prostate without lower urinary tract symptoms: Secondary | ICD-10-CM

## 2023-10-16 DIAGNOSIS — E559 Vitamin D deficiency, unspecified: Secondary | ICD-10-CM

## 2023-10-16 DIAGNOSIS — E785 Hyperlipidemia, unspecified: Secondary | ICD-10-CM | POA: Diagnosis not present

## 2023-10-16 LAB — COMPREHENSIVE METABOLIC PANEL
ALT: 20 U/L (ref 0–53)
AST: 17 U/L (ref 0–37)
Albumin: 4.1 g/dL (ref 3.5–5.2)
Alkaline Phosphatase: 73 U/L (ref 39–117)
BUN: 15 mg/dL (ref 6–23)
CO2: 30 meq/L (ref 19–32)
Calcium: 9.2 mg/dL (ref 8.4–10.5)
Chloride: 105 meq/L (ref 96–112)
Creatinine, Ser: 1.05 mg/dL (ref 0.40–1.50)
GFR: 75.63 mL/min (ref >60.00)
Glucose, Bld: 113 mg/dL — ABNORMAL HIGH (ref 70–99)
Potassium: 4.1 meq/L (ref 3.5–5.1)
Sodium: 141 meq/L (ref 135–145)
Total Bilirubin: 0.5 mg/dL (ref 0.2–1.2)
Total Protein: 6.7 g/dL (ref 6.0–8.3)

## 2023-10-16 LAB — VITAMIN D 25 HYDROXY (VIT D DEFICIENCY, FRACTURES): VITD: 26.81 ng/mL — ABNORMAL LOW (ref 30.00–100.00)

## 2023-10-16 LAB — PSA: PSA: 1.3 ng/mL (ref 0.10–4.00)

## 2023-10-16 LAB — HEMOGLOBIN A1C: Hgb A1c MFr Bld: 5.6 % (ref 4.6–6.5)

## 2023-10-16 LAB — LIPID PANEL
Cholesterol: 141 mg/dL (ref 0–200)
HDL: 48.9 mg/dL (ref >39.00)
LDL Cholesterol: 73 mg/dL (ref 0–99)
NonHDL: 92.43
Total CHOL/HDL Ratio: 3
Triglycerides: 98 mg/dL (ref 0.0–149.0)
VLDL: 19.6 mg/dL (ref 0.0–40.0)

## 2023-10-16 LAB — VITAMIN B12: Vitamin B-12: >1537 pg/mL — ABNORMAL HIGH (ref 211–911)

## 2023-10-17 ENCOUNTER — Other Ambulatory Visit: Payer: Self-pay | Admitting: Family Medicine

## 2023-10-17 MED ORDER — CYANOCOBALAMIN 500 MCG PO TABS: 500.0000 ug | ORAL_TABLET | ORAL | Status: DC

## 2023-11-02 ENCOUNTER — Encounter: Payer: Self-pay | Admitting: Family Medicine

## 2023-11-02 ENCOUNTER — Ambulatory Visit (INDEPENDENT_AMBULATORY_CARE_PROVIDER_SITE_OTHER): Payer: Managed Care, Other (non HMO) | Admitting: Family Medicine

## 2023-11-02 VITALS — BP 144/84 | HR 51 | Temp 97.9°F | Ht 68.0 in | Wt 189.2 lb

## 2023-11-02 DIAGNOSIS — G4733 Obstructive sleep apnea (adult) (pediatric): Secondary | ICD-10-CM

## 2023-11-02 DIAGNOSIS — N4889 Other specified disorders of penis: Secondary | ICD-10-CM | POA: Diagnosis not present

## 2023-11-02 DIAGNOSIS — I7121 Aneurysm of the ascending aorta, without rupture: Secondary | ICD-10-CM

## 2023-11-02 DIAGNOSIS — R7303 Prediabetes: Secondary | ICD-10-CM

## 2023-11-02 DIAGNOSIS — J432 Centrilobular emphysema: Secondary | ICD-10-CM

## 2023-11-02 DIAGNOSIS — N400 Enlarged prostate without lower urinary tract symptoms: Secondary | ICD-10-CM

## 2023-11-02 DIAGNOSIS — E559 Vitamin D deficiency, unspecified: Secondary | ICD-10-CM

## 2023-11-02 DIAGNOSIS — E538 Deficiency of other specified B group vitamins: Secondary | ICD-10-CM

## 2023-11-02 DIAGNOSIS — E785 Hyperlipidemia, unspecified: Secondary | ICD-10-CM

## 2023-11-02 DIAGNOSIS — Z0000 Encounter for general adult medical examination without abnormal findings: Secondary | ICD-10-CM

## 2023-11-02 DIAGNOSIS — Z87891 Personal history of nicotine dependence: Secondary | ICD-10-CM

## 2023-11-02 DIAGNOSIS — K219 Gastro-esophageal reflux disease without esophagitis: Secondary | ICD-10-CM

## 2023-11-02 DIAGNOSIS — R001 Bradycardia, unspecified: Secondary | ICD-10-CM

## 2023-11-02 DIAGNOSIS — R030 Elevated blood-pressure reading, without diagnosis of hypertension: Secondary | ICD-10-CM | POA: Insufficient documentation

## 2023-11-02 NOTE — Assessment & Plan Note (Addendum)
By imaging - pt remains asxs.

## 2023-11-02 NOTE — Assessment & Plan Note (Signed)
Preventative protocols reviewed and updated unless pt declined. Discussed healthy diet and lifestyle.  

## 2023-11-02 NOTE — Assessment & Plan Note (Signed)
BP remaining mildly elevated in setting of recent increased sodium intake (nachos, corn chips).  BP log sheet provided today, I asked him to monitor home BPs over next few weeks, jot down, send [place] readings

## 2023-11-02 NOTE — Assessment & Plan Note (Signed)
Continue yearly lung cancer screening program

## 2023-11-02 NOTE — Assessment & Plan Note (Signed)
Sees cardiology.

## 2023-11-02 NOTE — Assessment & Plan Note (Signed)
Chronic, stable

## 2023-11-02 NOTE — Assessment & Plan Note (Signed)
Chronic, stable on crestor MWF - continue this. The 10-year ASCVD risk score (Arnett DK, et al., 2019) is: 11.3%   Values used to calculate the score:     Age: 61 years     Sex: Male     Is Non-Hispanic African American: No     Diabetic: No     Tobacco smoker: No     Systolic Blood Pressure: 152 mmHg     Is BP treated: No     HDL Cholesterol: 48.9 mg/dL     Total Cholesterol: 141 mg/dL

## 2023-11-02 NOTE — Assessment & Plan Note (Signed)
Ongoing for the past 3 months, has decreased in size but persists, still intermittently draining small amt discharge. Will refer to urology for evaluation.

## 2023-11-02 NOTE — Assessment & Plan Note (Signed)
Chronic, stable on esomeprazole 20-40mg  daily.

## 2023-11-02 NOTE — Assessment & Plan Note (Signed)
S/p TURP Sheppard Penton).  PSA remains stable.

## 2023-11-02 NOTE — Progress Notes (Signed)
Ph: [phone] Fax: [phone]   Patient ID: Derrick Horn, male    DOB: Jun 24, 1961, 62 y.o.   MRN: [id number]  This visit was conducted in person.  BP (!) 144/84 (BP Location: Right Arm, Cuff Size: Large)   Pulse (!) 51   Temp 97.9 F (36.6 C) (Oral)   Ht 5\' 8"  (1.727 m)   Wt 189 lb 4 oz (85.8 kg)   SpO2 98%   BMI 28.78 kg/m   BP Readings from Last 3 Encounters:  11/02/23 (!) 144/84  08/15/23 124/80  04/03/23 115/69   CC: CPE Subjective:   HPI: Derrick YOUNGERMAN is a 62 y.o. male presenting on 11/02/2023 for Annual Exam   BP elevated today. No caffeine intake today. No increased salt in diet recently.   GERD - he's cut down on nexium to once daily, with 2 PRN diet.   Fmhx CAD - father with stent and CABG Coronary CTA score of zero 07/2020 Echocardiogram normal 07/2020 Found to have mild ascending aortic dilation.  Chronic sinus bradycardia.  Last saw cardiologist 09/2021.   OSA on CPAP followed by neurology (Dohmeier) last seen 03/2023 - memory issues improved after this. Uses AutoCPAP RestaAir.   Preventative: COLONOSCOPY WITH PROPOFOL 09/15/2021 - TAs, SSP, diverticulosis, hemorrhoids, rpt 3 yrs [facility] Payor) Prostate - yearly PSA. S/p green light laser TURP for BPH (2017)  Lung cancer screening - ~20 PY hx - continues yearly, CT lung cancer screen started 2021. Centrilobular and paraseptal emphysema with bronchiolitis and minimal parenchymal scarring - no respiratory symptoms Flu shot - yearly  COVID vaccine - Pfizer 03/2020, 04/2020, booster 12/2020 Tdap 2011, Tdap 09/2022 Shingrix - 06/2021, 10/2021  Advanced directive - does have advanced directive. Would want wife debbie and daughter to be HCPOA. Wants to update HCPOA and will bring [place] a copy.  Seat belt use discussed  Sunscreen use discussed. No changing moles on skin.  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 Interim medical history since our last visit reviewed. Allergies and medications reviewed and updated. Outpatient Medications Prior to Visit  Medication Sig Dispense Refill   aspirin EC 81 MG tablet Take 1 tablet (81 mg total) by mouth daily.     Cholecalciferol (VITAMIN D3) 1000 units CAPS Take 1 capsule by mouth daily.     Coenzyme Q10 (CO Q 10 PO) Take 1 tablet by mouth daily.     Cranberry-Vitamin C-Probiotic (AZO CRANBERRY PO) Take by mouth daily.     esomeprazole (NEXIUM) 20 MG capsule Take 2 capsules (40 mg total) by mouth daily at 12 noon.     Multiple Vitamin (MULTIVITAMIN ADULT) TABS Take 1 tablet by mouth daily.     polycarbophil (FIBERCON) 625 MG tablet Take 625 mg by mouth daily.     rosuvastatin (CRESTOR) 10 MG tablet TAKE 1 TABLET BY MOUTH THREE TIMES A WEEK 36 tablet 3   cyanocobalamin (VITAMIN B12) 500 MCG tablet Take 1 tablet (500 mcg total) by mouth once a week. (Patient not taking: Reported on 11/02/2023)     doxycycline (VIBRA-TABS) 100 MG tablet Take  1 tablet (100 mg total) by mouth 2 (two) times daily. 14 tablet 0   No facility-administered medications prior to visit.     Per HPI unless specifically indicated in ROS section below Review of Systems  Constitutional:  Negative for activity change, appetite change, chills, fatigue, fever and unexpected weight change.  HENT:  Negative for hearing loss.   Eyes:  Negative for visual disturbance.  Respiratory:  Negative for cough, chest tightness, shortness of breath and wheezing.   Cardiovascular:  Negative for chest pain, palpitations and leg swelling.  Gastrointestinal:  Negative for abdominal distention, abdominal pain, blood in stool, constipation,  diarrhea, nausea and vomiting.  Genitourinary:  Negative for difficulty urinating and hematuria.  Musculoskeletal:  Negative for arthralgias, myalgias and neck pain.  Skin:  Negative for rash.  Neurological:  Negative for dizziness, seizures, syncope and headaches.  Hematological:  Negative for adenopathy. Does not bruise/bleed easily.  Psychiatric/Behavioral:  Negative for dysphoric mood. The patient is not nervous/anxious.     Objective:  BP (!) 144/84 (BP Location: Right Arm, Cuff Size: Large)   Pulse (!) 51   Temp 97.9 F (36.6 C) (Oral)   Ht 5\' 8"  (1.727 m)   Wt 189 lb 4 oz (85.8 kg)   SpO2 98%   BMI 28.78 kg/m   Wt Readings from Last 3 Encounters:  11/02/23 189 lb 4 oz (85.8 kg)  08/15/23 190 lb 6 oz (86.4 kg)  04/03/23 188 lb 12.8 oz (85.6 kg)      Physical Exam Vitals and nursing note reviewed.  Constitutional:      General: He is not in acute distress.    Appearance: Normal appearance. He is well-developed. He is not ill-appearing.  HENT:     Head: Normocephalic and atraumatic.     Right Ear: Hearing, tympanic membrane, ear canal and external ear normal.     Left Ear: Hearing, tympanic membrane, ear canal and external ear normal.     Mouth/Throat:     Mouth: Mucous membranes are moist.     Pharynx: Oropharynx is clear. No oropharyngeal exudate or posterior oropharyngeal erythema.  Eyes:     General: No scleral icterus.    Extraocular Movements: Extraocular movements intact.     Conjunctiva/sclera: Conjunctivae normal.     Pupils: Pupils are equal, round, and reactive to light.  Neck:     Thyroid: No thyroid mass or thyromegaly.  Cardiovascular:     Rate and Rhythm: Normal rate and regular rhythm.     Pulses: Normal pulses.          Radial pulses are 2+ on the right side and 2+ on the left side.     Heart sounds: Normal heart sounds. No murmur heard. Pulmonary:     Effort: Pulmonary effort is normal. No respiratory distress.     Breath sounds: Normal breath  sounds. No wheezing, rhonchi or rales.  Abdominal:     General: Bowel sounds are normal. There is no distension.     Palpations: Abdomen is soft. There is no mass.     Tenderness: There is no abdominal tenderness. There is no guarding or rebound.     Hernia: No hernia is present.  Genitourinary:    Comments: Bump of skin at ventral base of penis near scrotum, able to express small amt pus  Musculoskeletal:        General: Normal range of motion.     Cervical back: Normal range of motion and neck supple.  Right lower leg: No edema.     Left lower leg: No edema.  Lymphadenopathy:     Cervical: No cervical adenopathy.  Skin:    General: Skin is warm and dry.     Findings: No rash.  Neurological:     General: No focal deficit present.     Mental Status: He is alert and oriented to person, place, and time.  Psychiatric:        Mood and Affect: Mood normal.        Behavior: Behavior normal.        Thought Content: Thought content normal.        Judgment: Judgment normal.       Results for orders placed or performed in visit on 10/16/23  Hemoglobin A1c  Result Value Ref Range   Hgb A1c MFr Bld 5.6 4.6 - 6.5 %  PSA  Result Value Ref Range   PSA 1.30 0.10 - 4.00 ng/mL  VITAMIN D 25 Hydroxy (Vit-D Deficiency, Fractures)  Result Value Ref Range   VITD 26.81 (L) 30.00 - 100.00 ng/mL  Vitamin B12  Result Value Ref Range   Vitamin B-12 >1537 (H) 211 - 911 pg/mL  Comprehensive metabolic panel  Result Value Ref Range   Sodium 141 135 - 145 mEq/L   Potassium 4.1 3.5 - 5.1 mEq/L   Chloride 105 96 - 112 mEq/L   CO2 30 19 - 32 mEq/L   Glucose, Bld 113 (H) 70 - 99 mg/dL   BUN 15 6 - 23 mg/dL   Creatinine, Ser 5.28 0.40 - 1.50 mg/dL   Total Bilirubin 0.5 0.2 - 1.2 mg/dL   Alkaline Phosphatase 73 39 - 117 U/L   AST 17 0 - 37 U/L   ALT 20 0 - 53 U/L   Total Protein 6.7 6.0 - 8.3 g/dL   Albumin 4.1 3.5 - 5.2 g/dL   GFR 41.32 >44.01 mL/min   Calcium 9.2 8.4 - 10.5 mg/dL  Lipid  panel  Result Value Ref Range   Cholesterol 141 0 - 200 mg/dL   Triglycerides 02.7 0.0 - 149.0 mg/dL   HDL 25.36 >64.40 mg/dL   VLDL 34.7 0.0 - 42.5 mg/dL   LDL Cholesterol 73 0 - 99 mg/dL   Total CHOL/HDL Ratio 3    NonHDL 92.43     Assessment & Plan:   Problem List Items Addressed This Visit     Healthcare maintenance - Primary (Chronic)    Preventative protocols reviewed and updated unless pt declined. Discussed healthy diet and lifestyle.       Ex-smoker    Continue yearly lung cancer screening program.      GERD    Chronic, stable on esomeprazole 20-40mg  daily.       Dyslipidemia    Chronic, stable on crestor MWF - continue this. The 10-year ASCVD risk score (Arnett DK, et al., 2019) is: 11.3%   Values used to calculate the score:     Age: 31 years     Sex: Male     Is Non-Hispanic African American: No     Diabetic: No     Tobacco smoker: No     Systolic Blood Pressure: 152 mmHg     Is BP treated: No     HDL Cholesterol: 48.9 mg/dL     Total Cholesterol: 141 mg/dL       BPH (benign prostatic hyperplasia)    S/p TURP Sheppard Penton).  PSA remains stable.  OSA on CPAP    Appreciate neuro care- continues nightly CPAP      Vitamin B12 deficiency    Levels now high - will stop supplemental B12 use and reassess control next labwork.       Thoracic ascending aortic aneurysm    Sees cardiology.       COPD (chronic obstructive pulmonary disease) (HCC)    By imaging - pt remains asxs.       Vitamin D deficiency    Continue vit D replacement.       Prediabetes    A1c normal this year.       Sinus bradycardia    Chronic, stable.       Penile cyst    Ongoing for the past 3 months, has decreased in size but persists, still intermittently draining small amt discharge. Will refer to urology for evaluation.       Relevant Orders   Ambulatory referral to Urology   Elevated blood pressure reading in office without diagnosis of hypertension    BP  remaining mildly elevated in setting of recent increased sodium intake (nachos, corn chips).  BP log sheet provided today, I asked him to monitor home BPs over next few weeks, jot down, send [place] readings         No orders of the defined types were placed in this encounter.   Orders Placed This Encounter  Procedures   Ambulatory referral to Urology    Referral Priority:   Routine    Referral Type:   Consultation    Referral Reason:   Specialty Services Required    Requested Specialty:   Urology    Number of Visits Requested:   1    Patient Instructions  BP was a bit high today - ?salt in diet related  We will refer you to urologist for further evaluation.  Start monitoring blood pressures a few times a week, jot down on log sheet provided today. Let me know if consistently >140/90.  You are doing well today  Return as needed or in 1 year for next physical   Follow up plan: Return in about 1 year (around 11/01/2024) for annual exam, prior fasting for blood work.  Eustaquio Boyden, MD

## 2023-11-02 NOTE — Assessment & Plan Note (Signed)
Levels now high - will stop supplemental B12 use and reassess control next labwork.

## 2023-11-02 NOTE — Assessment & Plan Note (Signed)
Appreciate neuro care- continues nightly CPAP

## 2023-11-02 NOTE — Assessment & Plan Note (Signed)
Continue vit D replacement.

## 2023-11-02 NOTE — Patient Instructions (Addendum)
BP was a bit high today - ?salt in diet related  We will refer you to urologist for further evaluation.  Start monitoring blood pressures a few times a week, jot down on log sheet provided today. Let me know if consistently >140/90.  You are doing well today  Return as needed or in 1 year for next physical

## 2023-11-02 NOTE — Assessment & Plan Note (Signed)
A1c normal this year.

## 2023-11-16 ENCOUNTER — Telehealth: Payer: Self-pay | Admitting: Family Medicine

## 2023-11-16 DIAGNOSIS — R030 Elevated blood-pressure reading, without diagnosis of hypertension: Secondary | ICD-10-CM

## 2023-11-16 MED ORDER — AMLODIPINE BESYLATE 2.5 MG PO TABS: 2.5000 mg | ORAL_TABLET | Freq: Every day | ORAL | 3 refills | Status: DC

## 2023-11-16 NOTE — Telephone Encounter (Signed)
Pt came in and dropped off his blood pressure log pt stated if any question please feel free too call him at [phone]

## 2023-11-16 NOTE — Telephone Encounter (Addendum)
BP log reviewed - 119-159/80-90s, pulse 40-60.  BP generally a bit too high.  Recommend start low dose amlodipine 2.5mg  daily sent to pharmacy.

## 2023-11-16 NOTE — Telephone Encounter (Signed)
 Placed log in Dr. Timoteo Expose box.

## 2023-11-16 NOTE — Telephone Encounter (Signed)
Spoke with pt relaying Dr. G's message. Pt verbalizes understanding.  

## 2023-11-16 NOTE — Addendum Note (Signed)
Addended by: Eustaquio Boyden on: 11/16/2023 10:23 AM   Modules accepted: Orders

## 2023-11-20 ENCOUNTER — Ambulatory Visit
Admission: RE | Admit: 2023-11-20 | Discharge: 2023-11-20 | Disposition: A | Discharge status: Home / Self Care | Payer: Managed Care, Other (non HMO) | Source: Ambulatory Visit | Attending: Cardiovascular Disease | Admitting: Cardiovascular Disease

## 2023-11-20 DIAGNOSIS — I77810 Thoracic aortic ectasia: Secondary | ICD-10-CM

## 2023-11-20 MED ORDER — IOHEXOL 350 MG/ML SOLN
75.0000 mL | Freq: Once | INTRAVENOUS | Status: AC | PRN
  Administered 2023-11-20: 75 mL via INTRAVENOUS

## 2023-12-10 ENCOUNTER — Encounter: Payer: Self-pay | Admitting: Family Medicine

## 2023-12-20 ENCOUNTER — Ambulatory Visit: Payer: Managed Care, Other (non HMO) | Admitting: Urology

## 2023-12-20 ENCOUNTER — Encounter: Payer: Self-pay | Admitting: Urology

## 2023-12-20 ENCOUNTER — Ambulatory Visit: Payer: Self-pay | Admitting: Urology

## 2023-12-20 VITALS — BP 145/84 | HR 57 | Ht 69.0 in | Wt 185.0 lb

## 2023-12-20 DIAGNOSIS — N4889 Other specified disorders of penis: Secondary | ICD-10-CM | POA: Diagnosis not present

## 2023-12-20 NOTE — Progress Notes (Signed)
 I, Maysun LITTIE Griffiths, acting as a scribe for Glendia JAYSON Barba, MD., have documented all relevant documentation on the behalf of Glendia JAYSON Barba, MD, as directed by Glendia JAYSON Barba, MD while in the presence of Glendia JAYSON Barba, MD.  12/20/2023 10:20 AM   Nancyann LOISE Search Jun 21, 1961 [id number]  Referring provider: Rilla Baller, MD [address] [place],  [place] [address]  Chief Complaint  Patient presents with   Other    HPI: Derrick Horn is a 63 y.o. male referred for evaluation of penile cyst.  Over the last 4-5 months, has noted a cystic lesion on the ventral aspect of the penis at the base, which has intermittently become swollen and draining. He described the size as large grape-sized at one point, but presently it has significantly decreased in size and he is asymptomatic. No bothersome LUTS History BPH status post PVP/TURP by Dr. Kassie June 2017.   PMH: Past Medical History:  Diagnosis Date   Chronic LBP    COPD (chronic obstructive pulmonary disease) (HCC)    minimal emphysema on CT scan   COVID-19 virus infection 04/08/2021   GERD (gastroesophageal reflux disease)    mild   Headache    silent migraine attack age 40's   History of chest pain 2010   stress test WNL   HLD (hyperlipidemia)    mild   Recurrent sinus infections    Sensorineural hearing loss (SNHL) of both ears 12/2015   mild-mod, no need for aides, rec melatoonin and lipoflavonoid [facility])   Sleep apnea 2020   CPap   Tear of left biceps muscle 01/19/2014    Surgical History: Past Surgical History:  Procedure Laterality Date   BICEPS TENDON REPAIR  2015   [name]   CARPAL TUNNEL RELEASE     right   chest CT  2011   no pulm nodules, mild apical scarring/emphysematous changes   COLONOSCOPY  09/2010   2 hyperplastic polyps Ollen)   COLONOSCOPY WITH PROPOFOL   09/15/2021   TAs, SSP, diverticulosis, hemorrhoids, rpt 3 yrs ([facility])   ETT  2010   WNL (Dr. Delford)   GREEN LIGHT  LASER TURP (TRANSURETHRAL RESECTION OF PROSTATE N/A 05/30/2016   Procedure: GREEN LIGHT LASER TURP (TRANSURETHRAL RESECTION OF PROSTATE;  Surgeon: Ozell JONELLE Kassie, MD;  Location: [facility] ORS;  Service: Urology;  Laterality: N/A;   SHOULDER ARTHROSCOPY WITH SUBACROMIAL DECOMPRESSION, ROTATOR CUFF REPAIR AND BICEP TENDON REPAIR  04/2018   Dr Melita    Home Medications:  Allergies as of 12/20/2023       Reactions   Azithromycin Other (See Comments)   bradycardia, hypotension,syncope, 3 days in hospital   Pyridium [phenazopyridine Hcl]    Nausea/vomiting        Medication List        Accurate as of December 20, 2023 10:20 AM. If you have any questions, ask your nurse or doctor.          amLODipine  2.5 MG tablet Commonly known as: NORVASC  Take 1 tablet (2.5 mg total) by mouth daily.   aspirin  EC 81 MG tablet Take 1 tablet (81 mg total) by mouth daily.   AZO CRANBERRY PO Take by mouth daily.   CO Q 10 PO Take 1 tablet by mouth daily.   esomeprazole  20 MG capsule Commonly known as: NexIUM  Take 2 capsules (40 mg total) by mouth daily at 12 noon.   Multivitamin Adult Tabs Take 1 tablet by mouth daily.  polycarbophil 625 MG tablet Commonly known as: FIBERCON Take 625 mg by mouth daily.   rosuvastatin  10 MG tablet Commonly known as: CRESTOR  TAKE 1 TABLET BY MOUTH THREE TIMES A WEEK   Vitamin D3 1000 units Caps Take 1 capsule by mouth daily.        Allergies:  Allergies  Allergen Reactions   Azithromycin Other (See Comments)    bradycardia, hypotension,syncope, 3 days in hospital   Pyridium [Phenazopyridine Hcl]     Nausea/vomiting    Family History: Family History  Problem Relation Age of Onset   Coronary artery disease Father 52       s/p stents, CABG   Prostate cancer Father 21       doing well   Cancer Maternal Grandmother        uterine   Coronary artery disease Maternal Grandfather 35   Stroke Maternal Grandfather    Diabetes Neg Hx    Colon  cancer Neg Hx    Colon polyps Neg Hx    Esophageal cancer Neg Hx    Rectal cancer Neg Hx    Stomach cancer Neg Hx    Memory loss Neg Hx    Dementia Neg Hx     Social History:  reports that he quit smoking about 5 years ago. His smoking use included cigarettes. He started smoking about 35 years ago. He has a 7.5 pack-year smoking history. He has never used smokeless tobacco. He reports that he does not drink alcohol and does not use drugs.   Physical Exam: BP (!) 145/84   Pulse (!) 57   Ht 5' 9 (1.753 m)   Wt 185 lb (83.9 kg)   BMI 27.32 kg/m   Constitutional:  Alert and oriented, No acute distress. HEENT: [place] AT Respiratory: Normal respiratory effort, no increased work of breathing. GU: Phallus circumcised. The ventral aspect of the penis proximal shaft is a mobile <1 cm cyst. No erythema or drainage. Able to lift off the urethra without problems.  Psychiatric: Normal mood and affect.   Assessment & Plan:    1. Penile inclusion cyst. We discussed options of observation versus excision. It would be better to excise when not inflamed as there is a greater chance of removing the cyst wall.  He would like to go ahead and schedule and will perform in office under local anesthesia.  We discussed potential complications including bleeding and infection. Post-operative care and follow-up was discussed.  I have reviewed the above documentation for accuracy and completeness, and I agree with the above.   Glendia JAYSON Barba, MD  [facility] [address] [place], [place] [address] [phone]

## 2023-12-31 ENCOUNTER — Ambulatory Visit: Payer: Self-pay | Admitting: Family Medicine

## 2023-12-31 MED ORDER — NIRMATRELVIR/RITONAVIR (PAXLOVID)TABLET: 3.0000 | ORAL_TABLET | Freq: Two times a day (BID) | ORAL | 0 refills | Status: AC

## 2023-12-31 NOTE — Telephone Encounter (Signed)
Spoke with pt offering virtual visit now, per Dr Reece Agar. Pt declined and handed phone to his wife, stating, "I don't understand why the prescription just be sent in." Per pt's wife, Stanton Kidney (on dpr), pt did not want to continue our conversation. Fyi to Dr Reece Agar.

## 2023-12-31 NOTE — Telephone Encounter (Signed)
Spoke with patient at wife's virtual appt.   BP this morning 130s systolic COVID symptoms started Thursday, tested positive yesterday 12/30/2023.  Treated with amoxicillin left over, tessalon perls, mucinex, tylenol  Symptoms include fevers/chills, headache, sweats, fatigue, body aches, cough, ST,rhinorrhea.  No dyspnea, wheeze, abd pain or nausea/diarrhea.  Risk factors include OSA, COPD.  Reviewed currently approved antiviral treatments.  Reviewed expected course of illness, anticipated course of recovery, as well as red flags to suggest COVID pneumonia and/or to seek urgent in-person care. Reviewed CDC isolation/quarantine guidelines.  Encouraged fluids and rest. Reviewed further supportive care measures at home including vit C 500mg  bid, vit D 2000 IU daily, zinc 100mg  daily, tylenol PRN, pepcid 20mg  BID PRN.   Recommend:  Full dose paxlovid.  Paxlovid drug interactions:  crestor - avoid for 5 days while on paxlovid amlodipine - drop ose to every other day while on paxlovid.  Discussed [email or web address]

## 2023-12-31 NOTE — Addendum Note (Signed)
Addended by: Eustaquio Boyden on: 12/31/2023 02:04 PM   Modules accepted: Orders

## 2023-12-31 NOTE — Telephone Encounter (Signed)
Pt wife called requesting paxlovid for her and pt since both covid positive.  Chief Complaint: covid positive Symptoms: fatigue, "both feel like got run over by a truck," congestion Frequency: continual Pertinent Negatives: Patient denies SOB more than usual, chest pain Disposition: [] 911 / [] ED /[] Urgent Care (no appt availability in office) / [] Appointment(In office/virtual)/ []  [facility]/ [] Home Care/ [] Refused Recommended Disposition /[] [facility]/ [x]  Follow-up with PCP Additional Notes: Wife reporting that she and pt are covid positive. Wife requesting script for Paxlovid, as pt has COPD and significant cardiac hx, pt symptoms started Thursday, had thought usual URI, but tested positive for covid yesterday. Wife reporting no SOB more than usual for pt, "just congested," fatigue, and "both feel like got run over by a truck." Wife confirms pt has taken paxlovid before, prescribed by Dr. Sharen Hones. Pt reporting pt had also been taking mucinex and benzonatate, asking if okay to continue taking while on paxlovid - nurse researched to find "moderate" interaction between mucinex and paxlovid - please advise. Wife requesting script be sent to [facility] on [address] in [place]. Wife reporting paxlovid "taste lasts forever in mouth," asking what can do to counteract that. Advised food with stronger tastes like fruit smoothie, peanut butter, etc. Advised call if worsening symptoms or other questions. Wife verbalized understanding.  Reason for Disposition  [1] HIGH RISK patient (e.g., weak immune system, age > 64 years, obesity with BMI 30 or higher, pregnant, chronic lung disease or other chronic medical condition) AND [2] COVID symptoms (e.g., cough, fever)  (Exceptions: Already seen by PCP and no new or worsening symptoms.)  Answer Assessment - Initial Assessment Questions 1. COVID-19 DIAGNOSIS: "How do you know that you have COVID?" (e.g., positive lab test or self-test, diagnosed  by doctor or NP/PA, symptoms after exposure).     Tested positive at home yesterday 3. ONSET: "When did the COVID-19 symptoms start?"      Thursday 4. WORST SYMPTOM: "What is your worst symptom?" (e.g., cough, fever, shortness of breath, muscle aches)     Fatigue, feel like got run over by a truck 5. COUGH: "Do you have a cough?" If Yes, ask: "How bad is the cough?"       No SOB more than normal per wife 7. RESPIRATORY STATUS: "Describe your breathing?" (e.g., normal; shortness of breath, wheezing, unable to speak)      No SOB more than normal per wife 9. OTHER SYMPTOMS: "Do you have any other symptoms?"  (e.g., chills, fatigue, headache, loss of smell or taste, muscle pain, sore throat)     Fatigue, congestion, feel like got run over by a truck 10. HIGH RISK DISEASE: "Do you have any chronic medical problems?" (e.g., asthma, heart or lung disease, weak immune system, obesity, etc.)       COPD, significant cardiac hx  Protocols used: Coronavirus (COVID-19) Diagnosed or Suspected-A-AH

## 2024-01-14 NOTE — Progress Notes (Signed)
 Cardiology Office Note:  .   Date:  01/15/2024  ID:  Derrick Horn Search, DOB 04-15-1961, MRN [id number] PCP: Rilla Baller, MD  [facility] Providers   History of Present Illness: .   Derrick Horn is a 63 y.o. male with a past medical history of thoracic aortic aneurysm, mild CAD, hyperlipidemia, sleep apnea, GERD, and former tobacco abuse who presents for annual follow-up visit.  History includes coronary artery CTA in August 2021 with minimal LAD plaque, calcium  score 0.  Echo August 2021 with LVEF 65%.  No valvular disease.  Dilation of ascending aorta with stable 4.4 cm ascending thoracic aorta by CT September 2023.  Was seen a year ago for follow-up.  Denied any chest pain, palpitations, dyspnea, lower extremity edema, orthopnea, PND, dizziness, syncope/near syncope.  Today, he presents with a history of hypertension managed with amlodipine  and hyperlipidemia managed with Crestor  with concerns about his cholesterol levels and heart rate. He reports that he has been monitoring his blood pressure at home and has noticed that his heart rate is consistently low, often in the 50s, and can drop to the 40s in the morning. He denies any associated symptoms such as dizziness or lightheadedness.  The patient also expresses concerns about his cholesterol levels. He reports that his LDL was 73 in his last check in November, and he is aware that it should ideally be less than 70. He admits to a diet that includes frequent snacking on chips and other processed foods, but also mentions that he does not consume a lot of red meat and his meals are often baked or grilled chicken or turkey. He expresses a desire to lose weight and is currently trying to maintain a weight of 180 pounds, with a goal of reaching 175 pounds.  The patient also mentions a history of back issues that have resulted in a decrease in his height by two inches. He does not elaborate on the nature of these back issues or any  associated symptom  Reports no shortness of breath nor dyspnea on exertion. Reports no chest pain, pressure, or tightness. No edema, orthopnea, PND. Reports no palpitations.   Discussed the use of AI scribe software for clinical note transcription with the patient, who gave verbal consent to proceed.  ROS: Pertinent ROS in HPI  Studies Reviewed: SABRA       CTA 11/20/23  IMPRESSION: 1. Mild fusiform aneurysmal dilation of the ascending thoracic aorta with a maximal diameter of 4.2 cm. Recommend annual imaging followup by CTA or MRA. This recommendation follows 2010 ACCF/AHA/AATS/ACR/ASA/SCA/SCAI/SIR/STS/SVM Guidelines for the Diagnosis and Management of Patients with Thoracic Aortic Disease. Circulation. 2010; 121: Z733-z630. 2. Small hiatal hernia. 3. Diffuse mild lower lobe bronchial wall thickening. 4. Trace paraseptal emphysema.       Physical Exam:   VS:  BP 126/86   Pulse (!) 52   Ht 5' 9 (1.753 m)   Wt 188 lb (85.3 kg)   SpO2 98%   BMI 27.76 kg/m    Wt Readings from Last 3 Encounters:  01/15/24 188 lb (85.3 kg)  12/20/23 185 lb (83.9 kg)  11/02/23 189 lb 4 oz (85.8 kg)    GEN: Well nourished, well developed in no acute distress NECK: No JVD; No carotid bruits CARDIAC: RRR, no murmurs, rubs, gallops RESPIRATORY:  Clear to auscultation without rales, wheezing or rhonchi  ABDOMEN: Soft, non-tender, non-distended EXTREMITIES:  No edema; No deformity   ASSESSMENT AND PLAN: .    Sinus bradycardia -  stable and chronic -he denied any lightheadedness/dizziness  Hypertension Controlled on Amlodipine  2.5mg  daily. Noted low resting heart rate, but no associated symptoms of dizziness or lightheadedness. -Continue Amlodipine  2.5mg  daily. -Check blood pressure twice weekly, preferably in the morning.  Hyperlipidemia On Crestor  10mg  daily. LDL slightly above target of 70mg /dL. Patient reports muscle aches, but tolerable. Discussed potential addition of Zetia if LDL remains  above target. -Continue Crestor  10mg  daily. -Consider diet modifications, specifically Mediterranean diet. -Check lipid panel in May 2025.  Enlarged Aorta Stable since 2021, recent CT scan showed slight decrease in size. -Continue annual CT scans for monitoring. -Continue to control blood pressure to prevent further enlargement.  CAD -no chest pain or SOB -would continue current medications -work on LDL < 70 with repeat labs in May  Weight Management Patient expressed desire to lose weight, currently at 182lbs with a goal of 175lbs. Discussed diet modifications and avoidance of non-FDA approved weight loss supplements. -Encourage continued weight loss efforts through diet and exercise. -Provide patient with literature on lipid-lowering and low sodium diets.  Follow-up Annual follow-up or sooner if needed. Lipid panel to be done in May 2025 with primary care provider.      Dispo: He can follow-up in a year.  Signed, Derrick Horn Fabry, PA-C

## 2024-01-15 ENCOUNTER — Encounter: Payer: Self-pay | Admitting: Physician Assistant

## 2024-01-15 ENCOUNTER — Ambulatory Visit: Payer: Managed Care, Other (non HMO) | Attending: Physician Assistant | Admitting: Physician Assistant

## 2024-01-15 VITALS — BP 126/86 | HR 52 | Ht 69.0 in | Wt 188.0 lb

## 2024-01-15 DIAGNOSIS — I2510 Atherosclerotic heart disease of native coronary artery without angina pectoris: Secondary | ICD-10-CM | POA: Diagnosis not present

## 2024-01-15 DIAGNOSIS — R001 Bradycardia, unspecified: Secondary | ICD-10-CM | POA: Diagnosis not present

## 2024-01-15 DIAGNOSIS — I7121 Aneurysm of the ascending aorta, without rupture: Secondary | ICD-10-CM

## 2024-01-15 DIAGNOSIS — I77810 Thoracic aortic ectasia: Secondary | ICD-10-CM

## 2024-01-15 NOTE — Patient Instructions (Signed)
Medication Instructions:  Your physician recommends that you continue on your current medications as directed. Please refer to the Current Medication list given to you today.  *If you need a refill on your cardiac medications before your next appointment, please call your pharmacy*  Lab Work: Derrick Horn would like for you to have your lipid panel and liver function tests checked at your primary care providers office in May 2025.  Testing/Procedures: None ordered today.  Follow-Up: At [facility], you and your health needs are our priority.  As part of our continuing mission to provide you with exceptional heart care, we have created designated Provider Care Teams.  These Care Teams include your primary Cardiologist (physician) and Advanced Practice Providers (APPs -  Physician Assistants and Nurse Practitioners) who all work together to provide you with the care you need, when you need it.  Your next appointment:   1 year(s)  Provider:   Verne Carrow, MD  or Jari Favre, PA-C    Other Instructions        1st Floor: - Lobby - Registration  - Pharmacy  - Lab - Cafe  2nd Floor: - PV Lab - Diagnostic Testing (echo, CT, nuclear med)  3rd Floor: - Vacant  4th Floor: - TCTS (cardiothoracic surgery) - AFib Clinic - Structural Heart Clinic - Vascular Surgery  - Vascular Ultrasound  5th Floor: - HeartCare Cardiology (general and EP) - Clinical Pharmacy for coumadin, hypertension, lipid, weight-loss medications, and med management appointments    Valet parking services will be available as well.

## 2024-01-28 ENCOUNTER — Other Ambulatory Visit: Payer: Self-pay | Admitting: Cardiovascular Disease

## 2024-01-28 ENCOUNTER — Ambulatory Visit: Payer: Managed Care, Other (non HMO) | Admitting: Urology

## 2024-01-28 VITALS — BP 144/81 | HR 49 | Ht 70.0 in | Wt 181.0 lb

## 2024-01-28 DIAGNOSIS — N4889 Other specified disorders of penis: Secondary | ICD-10-CM | POA: Diagnosis not present

## 2024-01-31 NOTE — Progress Notes (Signed)
 Procedure note  Diagnosis: Penile cyst  Procedure: Excision penile cyst  Anesthesia: 1% plain Xylocaine-5 cc  Indications: 63 y.o. male with a 4-month history of a penile cyst proximal ventral shaft which intermittently swells up with drainage.  Able to easily elevate off the urethra.  Description: He was prepped and draped in the usual sterile fashion.  There was at dumbbell shaped cystic lesion proximal midline shaft ventrally.  An ellipse was marked measuring 1 x 3 cm.  The skin was anesthetized along this line with 1% plain Xylocaine.  The skin was then incised.  Lifting the cyst off of the urethra the cyst was sharply excised with scissors.  Once completely excised the base was examined and no abnormal tissue was palpated.  Hemostasis was obtained with cautery.  The incision was closed with interrupted 3-0 chromic suture.  A dressing of gauze and Coban was applied.  The specimen was sent to pathology.  Instructions: No intercourse x 2 weeks May shower and 48 hours.  No bath, pool or hot tub x 2 weeks Call for redness, drainage or increasing pain Postop follow-up 1 month   Irineo Axon, MD

## 2024-02-04 ENCOUNTER — Telehealth: Payer: Self-pay

## 2024-02-04 NOTE — Telephone Encounter (Signed)
 Pt called triage line stating his stitches that Dr. Lonna Cobb placed last Monday had fell off. Pt states he noticed clear red discharge coming from where the stitches were. Pt states he has no pain, no fever no yellow of discharge with odor. Pt was informed that the stitches falling off was expected and normal. Pt was advise to call back if he has any pain, fever or abnormal discharge. Pt voiced understanding

## 2024-02-12 NOTE — Progress Notes (Unsigned)
 [facility] [address] [place]. [place] [address] [phone]       OFFICE FOLLOW UP NOTE  Mr. Derrick Horn Date of Birth:  09-29-1961 Medical Record Number:  [id number]    Primary neurologist: Dr. Vickey Huger Reason for visit: CPAP follow-up    SUBJECTIVE:   CHIEF COMPLAINT:  Chief Complaint  Patient presents with   Obstructive Sleep Apnea    Rm 8 alone Pt is well and stable, reports no new concerns with OSA/CPAP    Follow-up visit:  Prior visit: 04/03/2023 with Dr. Vickey Huger   Brief HPI:   Derrick Horn is a 63 y.o. male who is followed for OSA on CPAP therapy.  Prior HST 12/2018 which showed moderate to severe sleep apnea with total AHI 27.5/h and was started on AutoPap therapy in 12/2018.   Interval history:  Returns for yearly CPAP follow-up.  Compliance report shows excellent usage and optimal residual AHI.  Continues to tolerate CPAP well, use of nasal cradle. Did have COVID about 1 month ago, had some difficulty using CPAP due to congestion.  Feels energy levels are good and sleeps well at night.  ESS 1/24.  Previously experiencing memory difficulties prior to CPAP but this has since greatly improved.  Discussed pursuing repeat sleep study as he is due for new CPAP machine but he is concerned regarding cost, he also questions being a candidate for inspire device.  Routinely follows with DME [facility] and up to date on supplies.         ROS:   14 system review of systems performed and negative with exception of those listed in HPI  PMH:  Past Medical History:  Diagnosis Date   Chronic LBP    COPD (chronic obstructive pulmonary disease) (HCC)    minimal emphysema on CT scan   COVID-19 virus infection 04/08/2021   GERD (gastroesophageal reflux disease)    mild   Headache    silent migraine attack age 82's   History of chest pain 2010   stress test WNL   HLD (hyperlipidemia)    mild   Recurrent sinus infections    Sensorineural  hearing loss (SNHL) of both ears 12/2015   mild-mod, no need for aides, rec melatoonin and lipoflavonoid [facility])   Sleep apnea 2020   CPap   Tear of left biceps muscle 01/19/2014    PSH:  Past Surgical History:  Procedure Laterality Date   BICEPS TENDON REPAIR  2015   [name]   CARPAL TUNNEL RELEASE     right   chest CT  2011   no pulm nodules, mild apical scarring/emphysematous changes   COLONOSCOPY  09/2010   2 hyperplastic polyps Leone Payor)   COLONOSCOPY WITH PROPOFOL  09/15/2021   TAs, SSP, diverticulosis, hemorrhoids, rpt 3 yrs (Gessner)   ETT  2010   WNL (Dr. Eden Emms)   GREEN LIGHT LASER TURP (TRANSURETHRAL RESECTION OF PROSTATE N/A 05/30/2016   Procedure: GREEN LIGHT LASER TURP (TRANSURETHRAL RESECTION OF PROSTATE;  Surgeon: Orson Ape, MD;  Location: [facility] ORS;  Service: Urology;  Laterality: N/A;   SHOULDER ARTHROSCOPY WITH SUBACROMIAL DECOMPRESSION, ROTATOR CUFF REPAIR AND BICEP TENDON REPAIR  04/2018   Dr Rennis Chris    Social History:  Social History   Socioeconomic History   Marital status: Married    Spouse name: Not on file   Number of children: 1   Years of education: Not on file   Highest education level: Not on file  Occupational History  Occupation: Sales  Tobacco Use   Smoking status: Former    Current packs/day: 0.00    Average packs/day: 0.3 packs/day for 30.0 years (7.5 ttl pk-yrs)    Types: Cigarettes    Start date: 11/15/1988    Quit date: 11/15/2018    Years since quitting: 5.2   Smokeless tobacco: Never  Vaping Use   Vaping status: Never Used  Substance and Sexual Activity   Alcohol use: Never   Drug use: Never   Sexual activity: Yes  Other Topics Concern   Not on file  Social History Narrative   Caffeine: 1 cup coffee/day   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   1 dog   Activity: shooting, yardwork, no regular exercise   Diet: more salads, more oatmeal, good water    Social Drivers of Corporate investment banker Strain: Not on file  Food Insecurity: Not on file  Transportation Needs: Not on file  Physical Activity: Not on file  Stress: Not on file  Social Connections: Not on file  Intimate Partner Violence: Not on file    Family History:  Family History  Problem Relation Age of Onset   Coronary artery disease Father 70       s/p stents, CABG   Prostate cancer Father 16       doing well   Cancer Maternal Grandmother        uterine   Coronary artery disease Maternal Grandfather 35   Stroke Maternal Grandfather    Diabetes Neg Hx    Colon cancer Neg Hx    Colon polyps Neg Hx    Esophageal cancer Neg Hx    Rectal cancer Neg Hx    Stomach cancer Neg Hx    Memory loss Neg Hx    Dementia Neg Hx     Medications:   Current Outpatient Medications on File Prior to Visit  Medication Sig Dispense Refill   amLODipine (NORVASC) 2.5 MG tablet Take 1 tablet (2.5 mg total) by mouth daily. 90 tablet 3   aspirin EC 81 MG tablet Take 1 tablet (81 mg total) by mouth daily.     Cholecalciferol (VITAMIN D3) 1000 units CAPS Take 1 capsule by mouth daily.     Coenzyme Q10 (CO Q 10 PO) Take 1 tablet by mouth daily.     Cranberry-Vitamin C-Probiotic (AZO CRANBERRY PO) Take by mouth daily.     esomeprazole (NEXIUM) 20 MG capsule Take 2 capsules (40 mg total) by mouth daily at 12 noon.     Multiple Vitamin (MULTIVITAMIN ADULT) TABS Take 1 tablet by mouth daily.     polycarbophil (FIBERCON) 625 MG tablet Take 625 mg by mouth daily.     rosuvastatin (CRESTOR) 10 MG tablet TAKE 1 TABLET BY MOUTH THREE TIMES A WEEK. 36 tablet 3   No current facility-administered medications on file prior to visit.    Allergies:   Allergies  Allergen Reactions   Azithromycin Other (See Comments)    bradycardia, hypotension,syncope, 3 days in hospital   Pyridium [Phenazopyridine Hcl]     Nausea/vomiting      OBJECTIVE:  Physical Exam  Vitals:   02/13/24 0940  BP:  132/79  Pulse: (!) 48  Weight: 188 lb (85.3 kg)  Height: 5\' 10"  (1.778 m)   Body mass index is 26.98 kg/m. No results found.   General: well developed, well nourished, very pleasant middle-age Caucasian male, seated, in no  evident distress Head: head normocephalic and atraumatic.   Neck: supple with no carotid or supraclavicular bruits Cardiovascular: regular rate and rhythm, no murmurs Musculoskeletal: no deformity Skin:  no rash/petichiae Vascular:  Normal pulses all extremities   Neurologic Exam Mental Status: Awake and fully alert. Oriented to place and time. Recent and remote memory intact. Attention span, concentration and fund of knowledge appropriate. Mood and affect appropriate.  Cranial Nerves: Pupils equal, briskly reactive to light. Extraocular movements full without nystagmus. Visual fields full to confrontation. Hearing intact. Facial sensation intact. Face, tongue, palate moves normally and symmetrically.  Motor: Normal bulk and tone. Normal strength in all tested extremity muscles Gait and Station: Arises from chair without difficulty. Stance is normal. Gait demonstrates normal stride length and balance without use of AD.         ASSESSMENT/PLAN: CASTIN DONAGHUE is a 63 y.o. year old male    OSA on CPAP : Compliance report shows satisfactory usage with optimal residual AHI.  Continue current pressure settings.  Patient is due for new machine but declines interest in pursuing due to potential cost, encouraged him to contact his insurance company or [facility] to see what the cost would be, discussed concern if current machine were to start to malfunction, it may be several months prior to obtaining new machine, he verbalized understanding and will call if interested in pursuing.  Discussed treatment with inspire device, technically based on total AHI and BMI, he would qualify but advised Earnest Bailey may not completely treat his apnea, he was encouraged to schedule a  follow-up visit with Dr. Vickey Huger if he wishes to discuss this further. Discussed continued nightly usage with ensuring greater than 4 hours nightly for optimal benefit and per insurance purposes.  Continue to follow with DME company for any needed supplies or CPAP related concerns     Follow-up in 1 year or call earlier if needed   CC:  PCP: Eustaquio Boyden, MD    I spent 32 minutes of face-to-face and non-face-to-face time with patient.  This included previsit chart review, lab review, study review, order entry, electronic health record documentation, patient education and discussion regarding above diagnoses and treatment plan and answered all other questions to patient's satisfaction  Ihor Austin, [facility]  [facility] [address] [place], [place] [address]  Phone [phone] Fax [phone] Note: This document was prepared with digital dictation and possible smart phrase technology. Any transcriptional errors that result from this process are unintentional.

## 2024-02-13 ENCOUNTER — Ambulatory Visit: Payer: Managed Care, Other (non HMO) | Admitting: Adult Health

## 2024-02-13 ENCOUNTER — Encounter: Payer: Self-pay | Admitting: Adult Health

## 2024-02-13 VITALS — BP 132/79 | HR 48 | Ht 70.0 in | Wt 188.0 lb

## 2024-02-13 DIAGNOSIS — G4733 Obstructive sleep apnea (adult) (pediatric): Secondary | ICD-10-CM

## 2024-02-13 NOTE — Patient Instructions (Addendum)
 Your Plan:  If you are interested in obtaining a new CPAP machine please let me know. You can schedule a follow up visit with Dr. Vickey Huger if you are interested in more information about the [facility] device.   Continue nightly use of CPAP for adequate sleep apnea management  Continue to follow up with DME [facility] for any needed supplies or CPAP related concerns    Follow up in 1 year or call earlier if needed     Thank you for coming to see [place] at [facility]. I hope we have been able to provide you high quality care today.  You may receive a patient satisfaction survey over the next few weeks. We would appreciate your feedback and comments so that we may continue to improve ourselves and the health of our patients.

## 2024-03-07 ENCOUNTER — Ambulatory Visit: Payer: Managed Care, Other (non HMO) | Admitting: Urology

## 2024-03-07 ENCOUNTER — Encounter: Payer: Self-pay | Admitting: Urology

## 2024-03-07 VITALS — BP 138/83 | HR 61 | Ht 70.0 in | Wt 181.0 lb

## 2024-03-07 DIAGNOSIS — Z09 Encounter for follow-up examination after completed treatment for conditions other than malignant neoplasm: Secondary | ICD-10-CM

## 2024-03-07 NOTE — Progress Notes (Signed)
 I, Maysun Anabel Bene, acting as a scribe for Riki Altes, MD., have documented all relevant documentation on the behalf of Riki Altes, MD, as directed by Riki Altes, MD while in the presence of Riki Altes, MD.  03/07/2024 4:21 PM   Marga Hoots 29-Dec-1960 [id number]  Referring provider: Eustaquio Boyden, MD [address] [place],  [place] [address]  Chief Complaint  Patient presents with   Other    Penile cyst    Urologic history 1. Penile inclusion cyst  HPI: Derrick Horn is a 63 y.o. male presents for 1 month follow up.  Status post-excision of a proximal ventral shaft penile cyst 01/28/24. No significant post-op problems, though he state one of these sutures opened up prematurely, but has healed.    PMH: Past Medical History:  Diagnosis Date   Chronic LBP    COPD (chronic obstructive pulmonary disease) (HCC)    minimal emphysema on CT scan   COVID-19 virus infection 04/08/2021   GERD (gastroesophageal reflux disease)    mild   Headache    silent migraine attack age 60's   History of chest pain 2010   stress test WNL   HLD (hyperlipidemia)    mild   Recurrent sinus infections    Sensorineural hearing loss (SNHL) of both ears 12/2015   mild-mod, no need for aides, rec melatoonin and lipoflavonoid [facility])   Sleep apnea 2020   CPap   Tear of left biceps muscle 01/19/2014    Surgical History: Past Surgical History:  Procedure Laterality Date   BICEPS TENDON REPAIR  2015   [name]   CARPAL TUNNEL RELEASE     right   chest CT  2011   no pulm nodules, mild apical scarring/emphysematous changes   COLONOSCOPY  09/2010   2 hyperplastic polyps Leone Payor)   COLONOSCOPY WITH PROPOFOL  09/15/2021   TAs, SSP, diverticulosis, hemorrhoids, rpt 3 yrs (Gessner)   ETT  2010   WNL (Dr. Eden Emms)   GREEN LIGHT LASER TURP (TRANSURETHRAL RESECTION OF PROSTATE N/A 05/30/2016   Procedure: GREEN LIGHT LASER TURP (TRANSURETHRAL RESECTION OF  PROSTATE;  Surgeon: Orson Ape, MD;  Location: [facility] ORS;  Service: Urology;  Laterality: N/A;   SHOULDER ARTHROSCOPY WITH SUBACROMIAL DECOMPRESSION, ROTATOR CUFF REPAIR AND BICEP TENDON REPAIR  04/2018   Dr Rennis Chris    Home Medications:  Allergies as of 03/07/2024       Reactions   Azithromycin Other (See Comments)   bradycardia, hypotension,syncope, 3 days in hospital   Pyridium [phenazopyridine Hcl]    Nausea/vomiting        Medication List        Accurate as of March 07, 2024  4:21 PM. If you have any questions, ask your nurse or doctor.          amLODipine 2.5 MG tablet Commonly known as: NORVASC Take 1 tablet (2.5 mg total) by mouth daily.   aspirin EC 81 MG tablet Take 1 tablet (81 mg total) by mouth daily.   AZO CRANBERRY PO Take by mouth daily.   CO Q 10 PO Take 1 tablet by mouth daily.   esomeprazole 20 MG capsule Commonly known as: NexIUM Take 2 capsules (40 mg total) by mouth daily at 12 noon.   Multivitamin Adult Tabs Take 1 tablet by mouth daily.   polycarbophil 625 MG tablet Commonly known as: FIBERCON Take 625 mg by mouth daily.   rosuvastatin 10 MG tablet Commonly  known as: CRESTOR TAKE 1 TABLET BY MOUTH THREE TIMES A WEEK.   Vitamin D3 1000 units Caps Take 1 capsule by mouth daily.        Allergies:  Allergies  Allergen Reactions   Azithromycin Other (See Comments)    bradycardia, hypotension,syncope, 3 days in hospital   Pyridium [Phenazopyridine Hcl]     Nausea/vomiting    Family History: Family History  Problem Relation Age of Onset   Coronary artery disease Father 18       s/p stents, CABG   Prostate cancer Father 46       doing well   Cancer Maternal Grandmother        uterine   Coronary artery disease Maternal Grandfather 35   Stroke Maternal Grandfather    Diabetes Neg Hx    Colon cancer Neg Hx    Colon polyps Neg Hx    Esophageal cancer Neg Hx    Rectal cancer Neg Hx    Stomach cancer Neg Hx    Memory  loss Neg Hx    Dementia Neg Hx     Social History:  reports that he quit smoking about 5 years ago. His smoking use included cigarettes. He started smoking about 35 years ago. He has a 7.5 pack-year smoking history. He has never used smokeless tobacco. He reports that he does not drink alcohol and does not use drugs.   Physical Exam: BP 138/83   Pulse 61   Ht 5\' 10"  (1.778 m)   Wt 181 lb (82.1 kg)   BMI 25.97 kg/m   Constitutional:  Alert and oriented, No acute distress. GU: Excision site completely healed without nodules or induration.   Pathology: cyst incorrectly identified as a polynythal cyst, but most importantly a benign cyst.     Assessment & Plan:    Doing well Follow-up PRN.  [facility] [address], [address] [place], [place] [address] [phone]

## 2024-03-21 DIAGNOSIS — M7542 Impingement syndrome of left shoulder: Secondary | ICD-10-CM | POA: Diagnosis not present

## 2024-03-27 DIAGNOSIS — M9903 Segmental and somatic dysfunction of lumbar region: Secondary | ICD-10-CM | POA: Diagnosis not present

## 2024-03-27 DIAGNOSIS — M5441 Lumbago with sciatica, right side: Secondary | ICD-10-CM | POA: Diagnosis not present

## 2024-03-27 DIAGNOSIS — M5413 Radiculopathy, cervicothoracic region: Secondary | ICD-10-CM | POA: Diagnosis not present

## 2024-03-27 DIAGNOSIS — M9901 Segmental and somatic dysfunction of cervical region: Secondary | ICD-10-CM | POA: Diagnosis not present

## 2024-04-01 DIAGNOSIS — M9903 Segmental and somatic dysfunction of lumbar region: Secondary | ICD-10-CM | POA: Diagnosis not present

## 2024-04-01 DIAGNOSIS — M5413 Radiculopathy, cervicothoracic region: Secondary | ICD-10-CM | POA: Diagnosis not present

## 2024-04-01 DIAGNOSIS — M9901 Segmental and somatic dysfunction of cervical region: Secondary | ICD-10-CM | POA: Diagnosis not present

## 2024-04-01 DIAGNOSIS — M5441 Lumbago with sciatica, right side: Secondary | ICD-10-CM | POA: Diagnosis not present

## 2024-04-03 ENCOUNTER — Ambulatory Visit: Payer: Managed Care, Other (non HMO) | Admitting: Neurology

## 2024-04-03 DIAGNOSIS — M5441 Lumbago with sciatica, right side: Secondary | ICD-10-CM | POA: Diagnosis not present

## 2024-04-03 DIAGNOSIS — M9901 Segmental and somatic dysfunction of cervical region: Secondary | ICD-10-CM | POA: Diagnosis not present

## 2024-04-03 DIAGNOSIS — M9903 Segmental and somatic dysfunction of lumbar region: Secondary | ICD-10-CM | POA: Diagnosis not present

## 2024-04-03 DIAGNOSIS — M5413 Radiculopathy, cervicothoracic region: Secondary | ICD-10-CM | POA: Diagnosis not present

## 2024-04-07 DIAGNOSIS — M5441 Lumbago with sciatica, right side: Secondary | ICD-10-CM | POA: Diagnosis not present

## 2024-04-07 DIAGNOSIS — M5413 Radiculopathy, cervicothoracic region: Secondary | ICD-10-CM | POA: Diagnosis not present

## 2024-04-07 DIAGNOSIS — M9903 Segmental and somatic dysfunction of lumbar region: Secondary | ICD-10-CM | POA: Diagnosis not present

## 2024-04-07 DIAGNOSIS — M9901 Segmental and somatic dysfunction of cervical region: Secondary | ICD-10-CM | POA: Diagnosis not present

## 2024-04-13 ENCOUNTER — Other Ambulatory Visit: Payer: Self-pay | Admitting: Family Medicine

## 2024-04-13 DIAGNOSIS — E538 Deficiency of other specified B group vitamins: Secondary | ICD-10-CM

## 2024-04-13 DIAGNOSIS — R7303 Prediabetes: Secondary | ICD-10-CM

## 2024-04-13 DIAGNOSIS — N400 Enlarged prostate without lower urinary tract symptoms: Secondary | ICD-10-CM

## 2024-04-13 DIAGNOSIS — E559 Vitamin D deficiency, unspecified: Secondary | ICD-10-CM

## 2024-04-13 DIAGNOSIS — E785 Hyperlipidemia, unspecified: Secondary | ICD-10-CM

## 2024-04-15 ENCOUNTER — Other Ambulatory Visit (INDEPENDENT_AMBULATORY_CARE_PROVIDER_SITE_OTHER): Payer: Managed Care, Other (non HMO)

## 2024-04-15 DIAGNOSIS — R7303 Prediabetes: Secondary | ICD-10-CM

## 2024-04-15 DIAGNOSIS — M5441 Lumbago with sciatica, right side: Secondary | ICD-10-CM | POA: Diagnosis not present

## 2024-04-15 DIAGNOSIS — E559 Vitamin D deficiency, unspecified: Secondary | ICD-10-CM | POA: Diagnosis not present

## 2024-04-15 DIAGNOSIS — E785 Hyperlipidemia, unspecified: Secondary | ICD-10-CM

## 2024-04-15 DIAGNOSIS — N400 Enlarged prostate without lower urinary tract symptoms: Secondary | ICD-10-CM

## 2024-04-15 DIAGNOSIS — M5413 Radiculopathy, cervicothoracic region: Secondary | ICD-10-CM | POA: Diagnosis not present

## 2024-04-15 DIAGNOSIS — M9903 Segmental and somatic dysfunction of lumbar region: Secondary | ICD-10-CM | POA: Diagnosis not present

## 2024-04-15 DIAGNOSIS — E538 Deficiency of other specified B group vitamins: Secondary | ICD-10-CM

## 2024-04-15 DIAGNOSIS — M9901 Segmental and somatic dysfunction of cervical region: Secondary | ICD-10-CM | POA: Diagnosis not present

## 2024-04-15 LAB — COMPREHENSIVE METABOLIC PANEL WITH GFR
ALT: 16 U/L (ref 0–53)
AST: 16 U/L (ref 0–37)
Albumin: 4.3 g/dL (ref 3.5–5.2)
Alkaline Phosphatase: 74 U/L (ref 39–117)
BUN: 15 mg/dL (ref 6–23)
CO2: 29 meq/L (ref 19–32)
Calcium: 9 mg/dL (ref 8.4–10.5)
Chloride: 104 meq/L (ref 96–112)
Creatinine, Ser: 0.93 mg/dL (ref 0.40–1.50)
GFR: 87.18 mL/min (ref >60.00)
Glucose, Bld: 105 mg/dL — ABNORMAL HIGH (ref 70–99)
Potassium: 4 meq/L (ref 3.5–5.1)
Sodium: 139 meq/L (ref 135–145)
Total Bilirubin: 0.9 mg/dL (ref 0.2–1.2)
Total Protein: 6.6 g/dL (ref 6.0–8.3)

## 2024-04-15 LAB — VITAMIN B12: Vitamin B-12: 571 pg/mL (ref 211–911)

## 2024-04-15 LAB — LIPID PANEL
Cholesterol: 140 mg/dL (ref 0–200)
HDL: 47.8 mg/dL (ref >39.00)
LDL Cholesterol: 77 mg/dL (ref 0–99)
NonHDL: 92.27
Total CHOL/HDL Ratio: 3
Triglycerides: 75 mg/dL (ref 0.0–149.0)
VLDL: 15 mg/dL (ref 0.0–40.0)

## 2024-04-15 LAB — VITAMIN D 25 HYDROXY (VIT D DEFICIENCY, FRACTURES): VITD: 34.74 ng/mL (ref 30.00–100.00)

## 2024-04-15 LAB — PSA: PSA: 0.99 ng/mL (ref 0.10–4.00)

## 2024-04-15 LAB — HEMOGLOBIN A1C: Hgb A1c MFr Bld: 5.7 % (ref 4.6–6.5)

## 2024-04-17 DIAGNOSIS — M5441 Lumbago with sciatica, right side: Secondary | ICD-10-CM | POA: Diagnosis not present

## 2024-04-17 DIAGNOSIS — M9903 Segmental and somatic dysfunction of lumbar region: Secondary | ICD-10-CM | POA: Diagnosis not present

## 2024-04-17 DIAGNOSIS — M9901 Segmental and somatic dysfunction of cervical region: Secondary | ICD-10-CM | POA: Diagnosis not present

## 2024-04-17 DIAGNOSIS — M5413 Radiculopathy, cervicothoracic region: Secondary | ICD-10-CM | POA: Diagnosis not present

## 2024-04-18 ENCOUNTER — Encounter: Payer: Self-pay | Admitting: Family Medicine

## 2024-04-28 DIAGNOSIS — M9901 Segmental and somatic dysfunction of cervical region: Secondary | ICD-10-CM | POA: Diagnosis not present

## 2024-04-28 DIAGNOSIS — M5441 Lumbago with sciatica, right side: Secondary | ICD-10-CM | POA: Diagnosis not present

## 2024-04-28 DIAGNOSIS — M5413 Radiculopathy, cervicothoracic region: Secondary | ICD-10-CM | POA: Diagnosis not present

## 2024-04-28 DIAGNOSIS — M9903 Segmental and somatic dysfunction of lumbar region: Secondary | ICD-10-CM | POA: Diagnosis not present

## 2024-05-12 DIAGNOSIS — M542 Cervicalgia: Secondary | ICD-10-CM | POA: Diagnosis not present

## 2024-05-12 DIAGNOSIS — M5413 Radiculopathy, cervicothoracic region: Secondary | ICD-10-CM | POA: Diagnosis not present

## 2024-05-12 DIAGNOSIS — M9902 Segmental and somatic dysfunction of thoracic region: Secondary | ICD-10-CM | POA: Diagnosis not present

## 2024-05-12 DIAGNOSIS — M9901 Segmental and somatic dysfunction of cervical region: Secondary | ICD-10-CM | POA: Diagnosis not present

## 2024-05-19 DIAGNOSIS — M542 Cervicalgia: Secondary | ICD-10-CM | POA: Diagnosis not present

## 2024-05-19 DIAGNOSIS — M5413 Radiculopathy, cervicothoracic region: Secondary | ICD-10-CM | POA: Diagnosis not present

## 2024-05-19 DIAGNOSIS — M9901 Segmental and somatic dysfunction of cervical region: Secondary | ICD-10-CM | POA: Diagnosis not present

## 2024-05-19 DIAGNOSIS — M9902 Segmental and somatic dysfunction of thoracic region: Secondary | ICD-10-CM | POA: Diagnosis not present

## 2024-06-10 DIAGNOSIS — M542 Cervicalgia: Secondary | ICD-10-CM | POA: Diagnosis not present

## 2024-06-10 DIAGNOSIS — M9902 Segmental and somatic dysfunction of thoracic region: Secondary | ICD-10-CM | POA: Diagnosis not present

## 2024-06-10 DIAGNOSIS — M9901 Segmental and somatic dysfunction of cervical region: Secondary | ICD-10-CM | POA: Diagnosis not present

## 2024-06-10 DIAGNOSIS — M5413 Radiculopathy, cervicothoracic region: Secondary | ICD-10-CM | POA: Diagnosis not present

## 2024-07-17 ENCOUNTER — Telehealth: Payer: Self-pay

## 2024-07-17 NOTE — Telephone Encounter (Signed)
 Copied from CRM #[id number]. Topic: Clinical - Lab/Test Results >> Jul 17, 2024  3:05 PM Rea C wrote: Reason for CRM: Patient is scheduled for physical and would like labs prior.

## 2024-07-18 NOTE — Telephone Encounter (Signed)
 Please schedule labs 1 wk prior to physical

## 2024-07-18 NOTE — Telephone Encounter (Signed)
 Lvm to schedule for lab prior to CPE

## 2024-09-22 ENCOUNTER — Ambulatory Visit: Payer: Self-pay

## 2024-09-22 NOTE — Telephone Encounter (Signed)
 FYI Only or Action Required?: FYI only for provider.  Patient was last seen in primary care on 11/02/2023 by Rilla Baller, MD.  Called Nurse Triage reporting Leg Pain.  Symptoms began several days ago.  Interventions attempted: Nothing.  Symptoms are: stable.  Triage Disposition: See PCP When Office is Open (Within 3 Days)  Patient/caregiver understands and will follow disposition?: Yes      Copied from CRM [id number]. Topic: Clinical - Red Word Triage >> Sep 22, 2024 12:10 PM Ashley R wrote: Red Word that prompted transfer to Nurse Triage: Pain in hip/femur to the touch possible broken bone. Would like an xray but might be a bruise. Reason for Disposition  [1] MODERATE pain (e.g., interferes with normal activities, limping) AND [2] present > 3 days  Answer Assessment - Initial Assessment Questions Pt states that he was walking his dogs last week (about 80 pounds each) and they took off after another dog. They tripped him, he landed on his right hip on the street and then drug him about 10 feet. He states there is nor visible bruising, just tender to touch. He is able to walk and no other symptoms.    1. ONSET: When did the pain start?      Wednesday or Thursday  2. LOCATION: Where is the pain located?      Right hip 3. PAIN: How bad is the pain?    (Scale 1-10; or mild, moderate, severe)     Not painful until touches or bumps 4. WORK OR EXERCISE: Has there been any recent work or exercise that involved this part of the body?      Fall  5. CAUSE: What do you think is causing the leg pain?     Recent fall.  6. OTHER SYMPTOMS: Do you have any other symptoms? (e.g., chest pain, back pain, breathing difficulty, swelling, rash, fever, numbness, weakness)     no  Protocols used: Leg Pain-A-AH

## 2024-09-22 NOTE — Telephone Encounter (Signed)
 Noted. Will see then.

## 2024-09-24 ENCOUNTER — Ambulatory Visit: Admitting: Family Medicine

## 2024-09-24 ENCOUNTER — Encounter: Payer: Self-pay | Admitting: Family Medicine

## 2024-09-24 VITALS — BP 118/72 | HR 49 | Temp 98.5°F | Ht 70.0 in | Wt 180.4 lb

## 2024-09-24 DIAGNOSIS — M25551 Pain in right hip: Secondary | ICD-10-CM | POA: Diagnosis not present

## 2024-09-24 NOTE — Progress Notes (Signed)
 Ph: [phone] Fax: [phone]   Patient ID: Derrick Horn Search, male    DOB: 1961-10-30, 63 y.o.   MRN: [id number]  This visit was conducted in person.  BP 118/72   Pulse (!) 49   Temp 98.5 F (36.9 C) (Oral)   Ht 5' 10 (1.778 m)   Wt 180 lb 6 oz (81.8 kg)   SpO2 97%   BMI 25.88 kg/m    Chief Complaint  Patient presents with   Hip Pain    Pt C/o Hip pain to due a fall. Happened on 09/18/24.    Subjective:   Discussed the use of AI scribe software for clinical note transcription with the patient, who gave verbal consent to proceed.  History of Present Illness   Derrick Horn is a 63 year old male who presents with ongoing right hip pain after a fall.  He experienced right hip pain after a fall while walking his dogs, who pulled him down, causing him to land on his right side and be dragged approximately 12 feet. He has tenderness from the hip down to the mid lateral thigh on the right, with small amount of bruising. Ibuprofen provided some relief. The pain is localized to the right lateral side of the hip, with no radiation to the groin. He has difficulty sleeping on his right side due to tenderness, describing it as 'sleeping on a rock'. He uses a heating pad for relief.  He has history of sciatica on the same side, which may have been exacerbated by the fall. He experiences ongoing stiffness and difficulty with movements like putting on socks. He manages his sciatica with stretching exercises and has not undergone recent imaging or physical therapy.  He has experienced weight loss from 188 pounds in March to around 175 pounds, attributed to dietary changes. He has no symptoms of low blood pressures or dizziness. He received a flu shot approximately two weeks ago.          Relevant past medical, surgical, family and social history reviewed and updated as indicated. Interim medical history since our last visit reviewed. Allergies and medications reviewed and  updated. Outpatient Medications Prior to Visit  Medication Sig Dispense Refill   amLODipine  (NORVASC ) 2.5 MG tablet Take 1 tablet (2.5 mg total) by mouth daily. 90 tablet 3   aspirin  EC 81 MG tablet Take 1 tablet (81 mg total) by mouth daily.     Cholecalciferol (VITAMIN D3) 1000 units CAPS Take 1 capsule by mouth daily.     Coenzyme Q10 (CO Q 10 PO) Take 1 tablet by mouth daily.     Cranberry-Vitamin C-Probiotic (AZO CRANBERRY PO) Take by mouth daily.     esomeprazole  (NEXIUM ) 20 MG capsule Take 2 capsules (40 mg total) by mouth daily at 12 noon.     Multiple Vitamin (MULTIVITAMIN ADULT) TABS Take 1 tablet by mouth daily.     polycarbophil (FIBERCON) 625 MG tablet Take 625 mg by mouth daily.     rosuvastatin  (CRESTOR ) 10 MG tablet TAKE 1 TABLET BY MOUTH THREE TIMES A WEEK. 36 tablet 3   No facility-administered medications prior to visit.     Per HPI unless specifically indicated in ROS section below Review of Systems  Objective:  BP 118/72   Pulse (!) 49   Temp 98.5 F (36.9 C) (Oral)   Ht 5' 10 (1.778 m)   Wt 180 lb 6 oz (81.8 kg)   SpO2 97%   BMI 25.88 kg/m  Wt Readings from Last 3 Encounters:  09/24/24 180 lb 6 oz (81.8 kg)  03/07/24 181 lb (82.1 kg)  02/13/24 188 lb (85.3 kg)      Physical Exam Vitals and nursing note reviewed.  Constitutional:      Appearance: Normal appearance. He is not ill-appearing.  Musculoskeletal:        General: Swelling, tenderness and signs of injury present. Normal range of motion.     Right lower leg: No edema.     Left lower leg: No edema.     Comments:  No significant pain midline spine or paraspinous mm tenderness Neg seated SLR bilaterally. No pain with int/ext rotation at hip. No pain at SIJ, or sciatic notch bilaterally.  ++ pain to palpation of right greater trochanteric bursa and surrounding soft tissues, bruising present as well   Skin:    General: Skin is warm and dry.     Findings: Bruising present.     Comments: R  lateral thigh overlying hip bursa  Neurological:     Mental Status: He is alert.                Assessment & Plan:      Right hip contusion with trochanteric bursitis Right hip contusion with trochanteric bursitis post-fall. No fracture suspected. Pain localized to impact area, consistent with contusion and bursitis. - Provide exercises for hip bursitis. - Advise use of heating pad. - Recommend ibuprofen or acetaminophen  for pain. - Update if not improving to consider xray.  Right-sided sciatica Chronic right-sided sciatica with radiating pain and occasional toe numbness. Managed with stretching exercises. - Provide exercises for sciatica.  Essential hypertension Essential hypertension well-controlled with medication. Recent BP 118/72 mmHg.  General Health Maintenance Received flu shot at [facility].  Recording duration: 11 minutes       Problem List Items Addressed This Visit     Right hip pain - Primary   Traumatic R lateral hip contusion leading to trochanteric bursitis. See above.         No orders of the defined types were placed in this encounter.   No orders of the defined types were placed in this encounter.   Patient Instructions  Try exercises provided today  VISIT SUMMARY: Today, you were seen for ongoing right hip pain following a fall while walking your dogs. We also discussed your chronic right-sided sciatica and essential hypertension.  YOUR PLAN: -RIGHT HIP CONTUSION WITH TROCHANTERIC BURSITIS: You have a right hip contusion with trochanteric bursitis, which means you have a bruise and inflammation in the hip area due to your fall. You should continue using a heating pad and take ibuprofen or acetaminophen  for pain relief. Additionally, you will be given exercises to help with the bursitis.  -RIGHT-SIDED SCIATICA: Your chronic right-sided sciatica, which is pain that radiates along the path of the sciatic nerve, is being managed with stretching  exercises. You will be provided with additional exercises to help manage this condition.  -ESSENTIAL HYPERTENSION: Your essential hypertension, which is high blood pressure without a known secondary cause, is well-controlled with your current medication. Your recent blood pressure reading was 118/72 mmHg.  -GENERAL HEALTH MAINTENANCE: You received your flu shot at [facility] recently. Continue with your current health maintenance routine.  INSTRUCTIONS: Please follow the exercises provided for both your hip bursitis and sciatica. Continue using a heating pad and taking ibuprofen or acetaminophen  as needed for pain. If your symptoms do not improve or worsen, please let me know  to consider xray.  Follow up plan: Return if symptoms worsen or fail to improve.  Anton Blas, MD

## 2024-09-24 NOTE — Assessment & Plan Note (Addendum)
 Traumatic R lateral hip contusion leading to trochanteric bursitis. See above.

## 2024-09-24 NOTE — Patient Instructions (Addendum)
 Try exercises provided today  VISIT SUMMARY: Today, you were seen for ongoing right hip pain following a fall while walking your dogs. We also discussed your chronic right-sided sciatica and essential hypertension.  YOUR PLAN: -RIGHT HIP CONTUSION WITH TROCHANTERIC BURSITIS: You have a right hip contusion with trochanteric bursitis, which means you have a bruise and inflammation in the hip area due to your fall. You should continue using a heating pad and take ibuprofen or acetaminophen  for pain relief. Additionally, you will be given exercises to help with the bursitis.  -RIGHT-SIDED SCIATICA: Your chronic right-sided sciatica, which is pain that radiates along the path of the sciatic nerve, is being managed with stretching exercises. You will be provided with additional exercises to help manage this condition.  -ESSENTIAL HYPERTENSION: Your essential hypertension, which is high blood pressure without a known secondary cause, is well-controlled with your current medication. Your recent blood pressure reading was 118/72 mmHg.  -GENERAL HEALTH MAINTENANCE: You received your flu shot at [facility] recently. Continue with your current health maintenance routine.  INSTRUCTIONS: Please follow the exercises provided for both your hip bursitis and sciatica. Continue using a heating pad and taking ibuprofen or acetaminophen  as needed for pain. If your symptoms do not improve or worsen, please let me know to consider xray.

## 2024-10-07 ENCOUNTER — Other Ambulatory Visit: Payer: Self-pay | Admitting: Acute Care

## 2024-10-07 DIAGNOSIS — Z87891 Personal history of nicotine dependence: Secondary | ICD-10-CM

## 2024-10-07 DIAGNOSIS — Z122 Encounter for screening for malignant neoplasm of respiratory organs: Secondary | ICD-10-CM

## 2024-10-15 ENCOUNTER — Ambulatory Visit
Admission: RE | Admit: 2024-10-15 | Discharge: 2024-10-15 | Disposition: A | Discharge status: Home / Self Care | Source: Ambulatory Visit | Attending: Acute Care | Admitting: Acute Care

## 2024-10-15 DIAGNOSIS — Z87891 Personal history of nicotine dependence: Secondary | ICD-10-CM | POA: Diagnosis not present

## 2024-10-15 DIAGNOSIS — Z122 Encounter for screening for malignant neoplasm of respiratory organs: Secondary | ICD-10-CM

## 2024-10-21 ENCOUNTER — Other Ambulatory Visit: Payer: Self-pay

## 2024-10-21 DIAGNOSIS — Z122 Encounter for screening for malignant neoplasm of respiratory organs: Secondary | ICD-10-CM

## 2024-10-21 DIAGNOSIS — Z87891 Personal history of nicotine dependence: Secondary | ICD-10-CM

## 2024-11-04 ENCOUNTER — Other Ambulatory Visit: Payer: Self-pay | Admitting: Family Medicine

## 2024-11-05 NOTE — Telephone Encounter (Signed)
 ERx

## 2024-11-15 ENCOUNTER — Other Ambulatory Visit: Payer: Self-pay | Admitting: Family Medicine

## 2024-11-15 DIAGNOSIS — R7303 Prediabetes: Secondary | ICD-10-CM

## 2024-11-15 DIAGNOSIS — E785 Hyperlipidemia, unspecified: Secondary | ICD-10-CM

## 2024-11-15 DIAGNOSIS — N400 Enlarged prostate without lower urinary tract symptoms: Secondary | ICD-10-CM

## 2024-11-15 DIAGNOSIS — E559 Vitamin D deficiency, unspecified: Secondary | ICD-10-CM

## 2024-11-15 DIAGNOSIS — E538 Deficiency of other specified B group vitamins: Secondary | ICD-10-CM

## 2024-11-18 ENCOUNTER — Other Ambulatory Visit

## 2024-11-18 DIAGNOSIS — E559 Vitamin D deficiency, unspecified: Secondary | ICD-10-CM

## 2024-11-18 DIAGNOSIS — E785 Hyperlipidemia, unspecified: Secondary | ICD-10-CM

## 2024-11-18 DIAGNOSIS — E538 Deficiency of other specified B group vitamins: Secondary | ICD-10-CM

## 2024-11-18 DIAGNOSIS — R7303 Prediabetes: Secondary | ICD-10-CM | POA: Diagnosis not present

## 2024-11-18 DIAGNOSIS — N400 Enlarged prostate without lower urinary tract symptoms: Secondary | ICD-10-CM

## 2024-11-18 LAB — LIPID PANEL
Cholesterol: 140 mg/dL (ref 0–200)
HDL: 59.9 mg/dL (ref >39.00)
LDL Cholesterol: 68 mg/dL (ref 0–99)
NonHDL: 79.76
Total CHOL/HDL Ratio: 2
Triglycerides: 57 mg/dL (ref 0.0–149.0)
VLDL: 11.4 mg/dL (ref 0.0–40.0)

## 2024-11-18 LAB — BASIC METABOLIC PANEL WITH GFR
BUN: 15 mg/dL (ref 6–23)
CO2: 30 meq/L (ref 19–32)
Calcium: 9.5 mg/dL (ref 8.4–10.5)
Chloride: 106 meq/L (ref 96–112)
Creatinine, Ser: 0.97 mg/dL (ref 0.40–1.50)
GFR: 82.53 mL/min (ref >60.00)
Glucose, Bld: 109 mg/dL — ABNORMAL HIGH (ref 70–99)
Potassium: 3.9 meq/L (ref 3.5–5.1)
Sodium: 144 meq/L (ref 135–145)

## 2024-11-18 LAB — HEMOGLOBIN A1C: Hgb A1c MFr Bld: 5.4 % (ref 4.6–6.5)

## 2024-11-18 LAB — VITAMIN B12: Vitamin B-12: 428 pg/mL (ref 211–911)

## 2024-11-18 LAB — VITAMIN D 25 HYDROXY (VIT D DEFICIENCY, FRACTURES): VITD: 37.1 ng/mL (ref 30.00–100.00)

## 2024-11-18 LAB — PSA: PSA: 1.27 ng/mL (ref 0.10–4.00)

## 2024-11-19 ENCOUNTER — Encounter: Payer: Self-pay | Admitting: Physician Assistant

## 2024-11-20 ENCOUNTER — Ambulatory Visit: Payer: Self-pay | Admitting: Family Medicine

## 2024-11-25 ENCOUNTER — Ambulatory Visit
Admission: RE | Admit: 2024-11-25 | Discharge: 2024-11-25 | Disposition: A | Source: Ambulatory Visit | Attending: Family Medicine | Admitting: Family Medicine

## 2024-11-25 ENCOUNTER — Ambulatory Visit: Admitting: Family Medicine

## 2024-11-25 ENCOUNTER — Encounter: Payer: Self-pay | Admitting: Family Medicine

## 2024-11-25 VITALS — BP 120/80 | HR 45 | Temp 98.0°F | Ht 68.25 in | Wt 181.0 lb

## 2024-11-25 DIAGNOSIS — M25551 Pain in right hip: Secondary | ICD-10-CM | POA: Diagnosis not present

## 2024-11-25 DIAGNOSIS — M47816 Spondylosis without myelopathy or radiculopathy, lumbar region: Secondary | ICD-10-CM | POA: Diagnosis not present

## 2024-11-25 DIAGNOSIS — Z23 Encounter for immunization: Secondary | ICD-10-CM

## 2024-11-25 DIAGNOSIS — K219 Gastro-esophageal reflux disease without esophagitis: Secondary | ICD-10-CM

## 2024-11-25 DIAGNOSIS — E785 Hyperlipidemia, unspecified: Secondary | ICD-10-CM | POA: Diagnosis not present

## 2024-11-25 DIAGNOSIS — E559 Vitamin D deficiency, unspecified: Secondary | ICD-10-CM | POA: Diagnosis not present

## 2024-11-25 DIAGNOSIS — Z0000 Encounter for general adult medical examination without abnormal findings: Secondary | ICD-10-CM | POA: Diagnosis not present

## 2024-11-25 DIAGNOSIS — I7121 Aneurysm of the ascending aorta, without rupture: Secondary | ICD-10-CM

## 2024-11-25 DIAGNOSIS — R030 Elevated blood-pressure reading, without diagnosis of hypertension: Secondary | ICD-10-CM

## 2024-11-25 DIAGNOSIS — N400 Enlarged prostate without lower urinary tract symptoms: Secondary | ICD-10-CM

## 2024-11-25 DIAGNOSIS — E538 Deficiency of other specified B group vitamins: Secondary | ICD-10-CM

## 2024-11-25 DIAGNOSIS — R001 Bradycardia, unspecified: Secondary | ICD-10-CM | POA: Diagnosis not present

## 2024-11-25 DIAGNOSIS — M4186 Other forms of scoliosis, lumbar region: Secondary | ICD-10-CM

## 2024-11-25 DIAGNOSIS — G4733 Obstructive sleep apnea (adult) (pediatric): Secondary | ICD-10-CM

## 2024-11-25 DIAGNOSIS — R413 Other amnesia: Secondary | ICD-10-CM

## 2024-11-25 DIAGNOSIS — R7303 Prediabetes: Secondary | ICD-10-CM | POA: Diagnosis not present

## 2024-11-25 DIAGNOSIS — M1611 Unilateral primary osteoarthritis, right hip: Secondary | ICD-10-CM | POA: Diagnosis not present

## 2024-11-25 MED ORDER — AMLODIPINE BESYLATE 2.5 MG PO TABS: 2.5000 mg | ORAL_TABLET | Freq: Every day | ORAL | 3 refills | Status: AC

## 2024-11-25 MED ORDER — ESOMEPRAZOLE MAGNESIUM 20 MG PO CPDR: 20.0000 mg | DELAYED_RELEASE_CAPSULE | Freq: Every day | ORAL | Status: AC

## 2024-11-25 NOTE — Assessment & Plan Note (Addendum)
 Thought OSA related - but notes some worsening recently - ?inattention vs memory.  Will return for formal memory eval if new or worsening symptoms.

## 2024-11-25 NOTE — Assessment & Plan Note (Addendum)
 Severe, chronic, stable period on CPAP followed by neurology

## 2024-11-25 NOTE — Patient Instructions (Addendum)
 Prevnar-20 today (pneumonia shot) R hip xray today. Let us  know if interested in physical therapy.   You may call [facility] to schedule an appointment for repeat colonoscopy at [phone].   Good to see you today Return as needed or in 1 year for next physical (welcome to medicare visit)  Work on 4 core lifestyle modifications to support a healthy mind:  1. Nutritious well balance diet.  2. Regular physical activity routine.  3. Regular mental activity such as reading books, word puzzles, math puzzles, jigsaw puzzles.  4. Social engagement.  Also ensure good blood pressure control, limit alcohol, no smoking.    Let me know if worsening memory trouble for formal memory assessment.

## 2024-11-25 NOTE — Assessment & Plan Note (Signed)
 Continue vit D 1000 units daily.

## 2024-11-25 NOTE — Assessment & Plan Note (Signed)
 Brings picture of xrays from 2023 showing significant lumbar levoscoliosis done at orthopedist.

## 2024-11-25 NOTE — Assessment & Plan Note (Signed)
 Followed by cardiology

## 2024-11-25 NOTE — Assessment & Plan Note (Signed)
 S/p greenfield laser TURP treatment [facility] - 2017) PSA remains stable.

## 2024-11-25 NOTE — Assessment & Plan Note (Signed)
 Continue once daily nexium  20mg 

## 2024-11-25 NOTE — Assessment & Plan Note (Signed)
 Stable period off vit B12 replacement (previously too high)

## 2024-11-25 NOTE — Progress Notes (Signed)
 Ph: [phone] Fax: [phone]   Patient ID: Derrick Horn Search, male    DOB: [birth year], 63 y.o.   MRN: [id number]  This visit was conducted in person.  BP 120/80   Pulse (!) 45   Temp 98 F (36.7 C) (Oral)   Ht 5' 8.25 (1.734 m)   Wt 181 lb (82.1 kg)   SpO2 97%   BMI 27.32 kg/m    CC: CPE Subjective:   HPI: Derrick Horn is a 63 y.o. male presenting on 11/25/2024 for Annual Exam (Would like to discuss prevnar, rsv vaccines pros and cons//, and bp meds)   GERD - he's cut down on nexium  to once daily, with 2 PRN diet.   Fmhx CAD - father with stent and CABG Coronary CTA score of zero 07/2020 Echocardiogram normal 07/2020 Found to have mild ascending aortic dilation.  Chronic sinus bradycardia.   OSA on CPAP followed by neurology (Dohmeier) last seen 02/2024 - memory issues improved after this. Uses AutoCPAP RestaAir. Tolerating well, with good compliance.    Preventative: COLONOSCOPY WITH PROPOFOL  09/15/2021 - TAs, SSP, diverticulosis, hemorrhoids, rpt 3 yrs Ollen) Prostate - yearly PSA. S/p green light laser TURP for BPH (2017).  Lung cancer screening - ~20 PY hx - continues yearly, CT lung cancer screen started 2021. Centrilobular and paraseptal emphysema with bronchiolitis and minimal parenchymal scarring - no respiratory symptoms Flu shot - yearly  COVID vaccine - Pfizer 03/2020, 04/2020, booster 12/2020 Prevnar-20 - discussed , today 11/2024 Tdap 2011, Tdap 09/2022 Shingrix - 06/2021, 10/2021  Advanced directive - does have advanced directive. Would want wife debbie and daughter to be HCPOA. Wants to update HCPOA and will bring us  a copy.  Seat belt use discussed  Sunscreen use discussed. No changing moles on skin.  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 Interim medical history since our last visit reviewed. Allergies and medications reviewed and updated. Outpatient Medications Prior to Visit  Medication Sig Dispense Refill   aspirin  EC 81 MG tablet Take 1 tablet (81 mg total) by mouth daily.     Cholecalciferol (VITAMIN D3) 1000 units CAPS Take 1 capsule by mouth daily.     Coenzyme Q10 (CO Q 10 PO) Take 1 tablet by mouth daily.     Cranberry-Vitamin C-Probiotic (AZO CRANBERRY PO) Take by mouth daily.     Multiple Vitamin (MULTIVITAMIN ADULT) TABS Take 1 tablet by mouth daily.     polycarbophil (FIBERCON) 625 MG tablet Take 625 mg by mouth daily.     rosuvastatin  (CRESTOR ) 10 MG tablet TAKE 1 TABLET BY MOUTH THREE TIMES A WEEK. 36 tablet 3   amLODipine  (NORVASC ) 2.5 MG tablet TAKE 1 TABLET BY MOUTH EVERY DAY 90 tablet 0   esomeprazole  (NEXIUM ) 20 MG capsule Take 2 capsules (40 mg total) by mouth daily at 12 noon. (Patient taking differently: Take 20 mg by mouth daily.)     No facility-administered medications prior to visit.     Per HPI unless specifically indicated in ROS section below Review of Systems  Constitutional:  Negative for activity change, appetite change, chills,  fatigue, fever and unexpected weight change.  HENT:  Negative for hearing loss.   Eyes:  Negative for visual disturbance.  Respiratory:  Negative for cough, chest tightness, shortness of breath and wheezing.   Cardiovascular:  Negative for chest pain, palpitations and leg swelling.  Gastrointestinal:  Negative for abdominal distention, abdominal pain, blood in stool, constipation, diarrhea, nausea and vomiting.  Genitourinary:  Negative for difficulty urinating and hematuria.  Musculoskeletal:  Negative for arthralgias, myalgias and neck pain.  Skin:  Negative for rash.  Neurological:  Negative for dizziness,  seizures, syncope and headaches.  Hematological:  Negative for adenopathy. Does not bruise/bleed easily.  Psychiatric/Behavioral:  Negative for dysphoric mood. The patient is not nervous/anxious.     Objective:  BP 120/80   Pulse (!) 45   Temp 98 F (36.7 C) (Oral)   Ht 5' 8.25 (1.734 m)   Wt 181 lb (82.1 kg)   SpO2 97%   BMI 27.32 kg/m   Wt Readings from Last 3 Encounters:  11/25/24 181 lb (82.1 kg)  09/24/24 180 lb 6 oz (81.8 kg)  03/07/24 181 lb (82.1 kg)      Physical Exam Vitals and nursing note reviewed.  Constitutional:      General: He is not in acute distress.    Appearance: Normal appearance. He is well-developed. He is not ill-appearing.  HENT:     Head: Normocephalic and atraumatic.     Right Ear: Hearing, tympanic membrane, ear canal and external ear normal.     Left Ear: Hearing, tympanic membrane, ear canal and external ear normal.     Mouth/Throat:     Mouth: Mucous membranes are moist.     Pharynx: Oropharynx is clear. No oropharyngeal exudate or posterior oropharyngeal erythema.  Eyes:     General: No scleral icterus.    Extraocular Movements: Extraocular movements intact.     Conjunctiva/sclera: Conjunctivae normal.     Pupils: Pupils are equal, round, and reactive to light.  Neck:     Thyroid : No thyroid  mass or thyromegaly.     Vascular: No carotid bruit.  Cardiovascular:     Rate and Rhythm: Regular rhythm. Bradycardia present.     Pulses: Normal pulses.          Radial pulses are 2+ on the right side and 2+ on the left side.     Heart sounds: Normal heart sounds. No murmur heard. Pulmonary:     Effort: Pulmonary effort is normal. No respiratory distress.     Breath sounds: Normal breath sounds. No wheezing, rhonchi or rales.  Abdominal:     General: Bowel sounds are normal. There is no distension.     Palpations: Abdomen is soft. There is no mass.     Tenderness: There is no abdominal tenderness. There is no guarding or rebound.     Hernia:  No hernia is present.  Musculoskeletal:        General: Normal range of motion.     Cervical back: Normal range of motion and neck supple.     Right lower leg: No edema.     Left lower leg: No edema.     Comments:  L hip FROM  R hip discomfort and limited int rotation on testing   Lymphadenopathy:     Cervical: No cervical adenopathy.  Skin:    General: Skin is warm and dry.     Findings: No rash.  Neurological:     General: No focal deficit present.  Mental Status: He is alert and oriented to person, place, and time.  Psychiatric:        Mood and Affect: Mood normal.        Behavior: Behavior normal.        Thought Content: Thought content normal.        Judgment: Judgment normal.       Results for orders placed or performed in visit on 11/18/24  Basic metabolic panel with GFR   Collection Time: 11/18/24  9:57 AM  Result Value Ref Range   Sodium 144 135 - 145 mEq/L   Potassium 3.9 3.5 - 5.1 mEq/L   Chloride 106 96 - 112 mEq/L   CO2 30 19 - 32 mEq/L   Glucose, Bld 109 (H) 70 - 99 mg/dL   BUN 15 6 - 23 mg/dL   Creatinine, Ser 9.02 0.40 - 1.50 mg/dL   GFR 17.46 >39.99 mL/min   Calcium  9.5 8.4 - 10.5 mg/dL  VITAMIN D  25 Hydroxy (Vit-D Deficiency, Fractures)   Collection Time: 11/18/24  9:57 AM  Result Value Ref Range   VITD 37.10 30.00 - 100.00 ng/mL  Vitamin B12   Collection Time: 11/18/24  9:57 AM  Result Value Ref Range   Vitamin B-12 428 211 - 911 pg/mL  Hemoglobin A1c   Collection Time: 11/18/24  9:57 AM  Result Value Ref Range   Hgb A1c MFr Bld 5.4 4.6 - 6.5 %  PSA   Collection Time: 11/18/24  9:57 AM  Result Value Ref Range   PSA 1.27 0.10 - 4.00 ng/mL  Lipid panel   Collection Time: 11/18/24  9:57 AM  Result Value Ref Range   Cholesterol 140 0 - 200 mg/dL   Triglycerides 42.9 0.0 - 149.0 mg/dL   HDL 40.09 >60.99 mg/dL   VLDL 88.5 0.0 - 59.9 mg/dL   LDL Cholesterol 68 0 - 99 mg/dL   Total CHOL/HDL Ratio 2    NonHDL 79.76   No results found for:  FOLATE  Assessment & Plan:   Problem List Items Addressed This Visit     Healthcare maintenance - Primary (Chronic)   Preventative protocols reviewed and updated unless pt declined. Discussed healthy diet and lifestyle.       GERD   Continue once daily nexium  20mg       Relevant Medications   esomeprazole  (NEXIUM ) 20 MG capsule   Dyslipidemia   Chronic, stable on crestor  MWF followed by cardiology. The 10-year ASCVD risk score (Arnett DK, et al., 2019) is: 7.3%   Values used to calculate the score:     Age: 60 years     Clinically relevant sex: Male     Is Non-Hispanic African American: No     Diabetic: No     Tobacco smoker: No     Systolic Blood Pressure: 120 mmHg     Is BP treated: No     HDL Cholesterol: 59.9 mg/dL     Total Cholesterol: 140 mg/dL       BPH (benign prostatic hyperplasia)   S/p greenfield laser TURP treatment [facility] - 2017) PSA remains stable.       Memory loss   Thought OSA related - but notes some worsening recently - ?inattention vs memory.  Will return for formal memory eval if new or worsening symptoms.       OSA on CPAP   Severe, chronic, stable period on CPAP followed by neurology      Vitamin B12 deficiency   Stable  period off vit B12 replacement (previously too high)      Thoracic ascending aortic aneurysm   Followed by cardiology.       Relevant Medications   amLODipine  (NORVASC ) 2.5 MG tablet   Vitamin D  deficiency   Continue vit D 1000 units daily      Prediabetes   A1c remains normal, glu mildly elevated - will continue carefully watching sugar in diet.       Sinus bradycardia   Relevant Medications   amLODipine  (NORVASC ) 2.5 MG tablet   Elevated blood pressure reading in office without diagnosis of hypertension   Chronic, BP stable on amlodipine  2.5mg  daily - continue      Right hip pain   Previous traumatic R hip contusion after fall 09/2024  Now with evidence of decreased R hip ROM - concern for OA - will  check baseline R hip films today.       Relevant Orders   DG HIP UNILAT W OR W/O PELVIS 2-3 VIEWS RIGHT   Levoscoliosis of lumbar spine   Brings picture of xrays from 2023 showing significant lumbar levoscoliosis done at orthopedist.       Other Visit Diagnoses       Encounter for immunization       Relevant Orders   Pneumococcal conjugate vaccine 20-valent (Prevnar 20)        Meds ordered this encounter  Medications   esomeprazole  (NEXIUM ) 20 MG capsule    Sig: Take 1 capsule (20 mg total) by mouth daily.   amLODipine  (NORVASC ) 2.5 MG tablet    Sig: Take 1 tablet (2.5 mg total) by mouth daily.    Dispense:  90 tablet    Refill:  3    Orders Placed This Encounter  Procedures   DG HIP UNILAT W OR W/O PELVIS 2-3 VIEWS RIGHT    Standing Status:   Future    Expiration Date:   05/26/2025    Reason for Exam (SYMPTOM  OR DIAGNOSIS REQUIRED):   R hip pain x months eval for osteoarthritis    Preferred imaging location?:   [facility]   Pneumococcal conjugate vaccine 20-valent (Prevnar 20)    Patient Instructions  Prevnar-20 today (pneumonia shot) R hip xray today. Let us  know if interested in physical therapy.   You may call  GI to schedule an appointment for repeat colonoscopy at [phone].   Good to see you today Return as needed or in 1 year for next physical (welcome to medicare visit)  Work on 4 core lifestyle modifications to support a healthy mind:  1. Nutritious well balance diet.  2. Regular physical activity routine.  3. Regular mental activity such as reading books, word puzzles, math puzzles, jigsaw puzzles.  4. Social engagement.  Also ensure good blood pressure control, limit alcohol, no smoking.    Let me know if worsening memory trouble for formal memory assessment.   Follow up plan: Return in about 1 year (around 11/25/2025) for annual exam, prior fasting for blood work.  Anton Blas, MD

## 2024-11-25 NOTE — Assessment & Plan Note (Signed)
 Preventative protocols reviewed and updated unless pt declined. Discussed healthy diet and lifestyle.

## 2024-11-25 NOTE — Assessment & Plan Note (Signed)
 A1c remains normal, glu mildly elevated - will continue carefully watching sugar in diet.

## 2024-11-25 NOTE — Assessment & Plan Note (Signed)
 Chronic, BP stable on amlodipine  2.5mg  daily - continue

## 2024-11-25 NOTE — Assessment & Plan Note (Signed)
 Previous traumatic R hip contusion after fall 09/2024  Now with evidence of decreased R hip ROM - concern for OA - will check baseline R hip films today.

## 2024-11-25 NOTE — Assessment & Plan Note (Signed)
 Chronic, stable on crestor  MWF followed by cardiology. The 10-year ASCVD risk score (Arnett DK, et al., 2019) is: 7.3%   Values used to calculate the score:     Age: 63 years     Clinically relevant sex: Male     Is Non-Hispanic African American: No     Diabetic: No     Tobacco smoker: No     Systolic Blood Pressure: 120 mmHg     Is BP treated: No     HDL Cholesterol: 59.9 mg/dL     Total Cholesterol: 140 mg/dL

## 2024-11-27 ENCOUNTER — Telehealth: Payer: Self-pay | Admitting: Family Medicine

## 2024-11-27 NOTE — Telephone Encounter (Signed)
 Copied from CRM #[id number]. Topic: General - Call Back - No Documentation >> Nov 27, 2024  9:17 AM Olam RAMAN wrote: Reason for CRM:  copies of CT and x ray and wanted ot know if it was ok cd form wanted ot be sure cd form was ok. Cb: [phone] debra

## 2024-11-27 NOTE — Telephone Encounter (Signed)
 Left message to return call to office. Need to follow up regarding x ray CD question.

## 2024-11-28 ENCOUNTER — Telehealth: Payer: Self-pay | Admitting: Family Medicine

## 2024-11-28 DIAGNOSIS — G8929 Other chronic pain: Secondary | ICD-10-CM

## 2024-11-28 NOTE — Telephone Encounter (Signed)
 Checked with front desk they have given you CD for review.

## 2024-11-28 NOTE — Telephone Encounter (Signed)
 Patient dropped off document xrays,low spine xrays , MRI, to be filled out by provider. Patient requested to send it back via Call Patient to pick up within 5-days. Document is located in providers tray at front office.Please advise at Mobile [phone] (mobile)       Pt dropped off copys for the provider to have to put in pts chart

## 2024-12-01 ENCOUNTER — Other Ambulatory Visit: Payer: Self-pay | Admitting: Family Medicine

## 2024-12-01 NOTE — Telephone Encounter (Signed)
 MRI L spine 01/06/2023 Report reviewed, sent for scanning Will see if we can load images onto our system.

## 2024-12-08 ENCOUNTER — Ambulatory Visit: Payer: Self-pay | Admitting: Family Medicine

## 2025-01-16 ENCOUNTER — Encounter: Payer: Self-pay | Admitting: Family Medicine

## 2025-02-17 ENCOUNTER — Ambulatory Visit: Admitting: Adult Health
